# Patient Record
Sex: Male | Born: 1960 | Race: White | Hispanic: No | Marital: Single | Smoking: Current every day smoker
Health system: Southern US, Community
[De-identification: ages and names within clinical notes are randomized; demographics above are authoritative.]

## PROBLEM LIST (undated history)

## (undated) DIAGNOSIS — G8921 Chronic pain due to trauma: Secondary | ICD-10-CM

## (undated) DIAGNOSIS — M5417 Radiculopathy, lumbosacral region: Secondary | ICD-10-CM

## (undated) DIAGNOSIS — G588 Other specified mononeuropathies: Secondary | ICD-10-CM

## (undated) DIAGNOSIS — R209 Unspecified disturbances of skin sensation: Secondary | ICD-10-CM

## (undated) DIAGNOSIS — R0683 Snoring: Secondary | ICD-10-CM

## (undated) DIAGNOSIS — S43429A Sprain of unspecified rotator cuff capsule, initial encounter: Secondary | ICD-10-CM

## (undated) DIAGNOSIS — I219 Acute myocardial infarction, unspecified: Secondary | ICD-10-CM

## (undated) DIAGNOSIS — T7840XA Allergy, unspecified, initial encounter: Secondary | ICD-10-CM

## (undated) HISTORY — DX: Radiculopathy, lumbosacral region: M54.17

## (undated) HISTORY — DX: Other specified mononeuropathies: G58.8

## (undated) HISTORY — PX: MULTIPLE TOOTH EXTRACTIONS: SHX2053

## (undated) HISTORY — DX: Unspecified disturbances of skin sensation: R20.9

## (undated) HISTORY — DX: Acute myocardial infarction, unspecified: I21.9

## (undated) HISTORY — DX: Sprain of unspecified rotator cuff capsule, initial encounter: S43.429A

## (undated) HISTORY — DX: Allergy, unspecified, initial encounter: T78.40XA

## (undated) HISTORY — DX: Chronic pain due to trauma: G89.21

---

## 2009-12-23 ENCOUNTER — Inpatient Hospital Stay (HOSPITAL_COMMUNITY): Admission: AC | Admit: 2009-12-23 | Discharge: 2010-01-02 | Payer: Self-pay

## 2009-12-30 ENCOUNTER — Ambulatory Visit: Payer: Self-pay | Admitting: Physical Medicine & Rehabilitation

## 2010-01-02 ENCOUNTER — Inpatient Hospital Stay (HOSPITAL_COMMUNITY)
Admission: RE | Admit: 2010-01-02 | Discharge: 2010-01-09 | Payer: Self-pay | Admitting: Physical Medicine & Rehabilitation

## 2010-06-16 ENCOUNTER — Encounter
Admission: RE | Admit: 2010-06-16 | Discharge: 2010-08-07 | Payer: Self-pay | Source: Home / Self Care | Attending: Physical Medicine & Rehabilitation | Admitting: Physical Medicine & Rehabilitation

## 2010-06-20 ENCOUNTER — Ambulatory Visit: Payer: Self-pay | Admitting: Physical Medicine & Rehabilitation

## 2010-07-03 ENCOUNTER — Ambulatory Visit: Payer: Self-pay | Admitting: Physical Medicine & Rehabilitation

## 2010-07-13 HISTORY — PX: SHOULDER SURGERY: SHX246

## 2010-08-04 ENCOUNTER — Encounter
Admission: RE | Admit: 2010-08-04 | Discharge: 2010-08-07 | Payer: Self-pay | Source: Home / Self Care | Attending: Physical Medicine & Rehabilitation | Admitting: Physical Medicine & Rehabilitation

## 2010-08-26 ENCOUNTER — Ambulatory Visit: Payer: Worker's Compensation | Admitting: Physical Medicine & Rehabilitation

## 2010-08-26 ENCOUNTER — Encounter: Payer: Worker's Compensation | Attending: Physical Medicine & Rehabilitation

## 2010-08-26 DIAGNOSIS — G8921 Chronic pain due to trauma: Secondary | ICD-10-CM | POA: Insufficient documentation

## 2010-08-26 DIAGNOSIS — M545 Low back pain, unspecified: Secondary | ICD-10-CM

## 2010-08-26 DIAGNOSIS — IMO0002 Reserved for concepts with insufficient information to code with codable children: Secondary | ICD-10-CM | POA: Insufficient documentation

## 2010-08-26 DIAGNOSIS — R209 Unspecified disturbances of skin sensation: Secondary | ICD-10-CM

## 2010-08-26 DIAGNOSIS — M79609 Pain in unspecified limb: Secondary | ICD-10-CM | POA: Insufficient documentation

## 2010-08-26 DIAGNOSIS — M47817 Spondylosis without myelopathy or radiculopathy, lumbosacral region: Secondary | ICD-10-CM

## 2010-08-26 DIAGNOSIS — M549 Dorsalgia, unspecified: Secondary | ICD-10-CM | POA: Insufficient documentation

## 2010-09-28 LAB — DIFFERENTIAL
Basophils Absolute: 0.1 10*3/uL (ref 0.0–0.1)
Basophils Absolute: 0.1 10*3/uL (ref 0.0–0.1)
Eosinophils Absolute: 0.6 10*3/uL (ref 0.0–0.7)
Eosinophils Relative: 6 % — ABNORMAL HIGH (ref 0–5)
Lymphocytes Relative: 24 % (ref 12–46)
Lymphocytes Relative: 27 % (ref 12–46)
Neutro Abs: 5 10*3/uL (ref 1.7–7.7)

## 2010-09-28 LAB — HEMOGLOBIN AND HEMATOCRIT, BLOOD: Hemoglobin: 10 g/dL — ABNORMAL LOW (ref 13.0–17.0)

## 2010-09-28 LAB — CULTURE, RESPIRATORY W GRAM STAIN: Culture: NORMAL

## 2010-09-28 LAB — CBC
HCT: 29.1 % — ABNORMAL LOW (ref 39.0–52.0)
HCT: 29.1 % — ABNORMAL LOW (ref 39.0–52.0)
HCT: 32.1 % — ABNORMAL LOW (ref 39.0–52.0)
HCT: 35.8 % — ABNORMAL LOW (ref 39.0–52.0)
HCT: 37 % — ABNORMAL LOW (ref 39.0–52.0)
Hemoglobin: 10.7 g/dL — ABNORMAL LOW (ref 13.0–17.0)
Hemoglobin: 12 g/dL — ABNORMAL LOW (ref 13.0–17.0)
Hemoglobin: 12.6 g/dL — ABNORMAL LOW (ref 13.0–17.0)
Hemoglobin: 9.9 g/dL — ABNORMAL LOW (ref 13.0–17.0)
MCH: 29.7 pg (ref 26.0–34.0)
MCHC: 34 g/dL (ref 30.0–36.0)
MCV: 88.8 fL (ref 78.0–100.0)
MCV: 89.9 fL (ref 78.0–100.0)
MCV: 89.9 fL (ref 78.0–100.0)
MCV: 90.6 fL (ref 78.0–100.0)
Platelets: 130 10*3/uL — ABNORMAL LOW (ref 150–400)
Platelets: 131 10*3/uL — ABNORMAL LOW (ref 150–400)
Platelets: 314 10*3/uL (ref 150–400)
Platelets: 514 10*3/uL — ABNORMAL HIGH (ref 150–400)
RBC: 3.23 MIL/uL — ABNORMAL LOW (ref 4.22–5.81)
RBC: 3.24 MIL/uL — ABNORMAL LOW (ref 4.22–5.81)
RBC: 3.6 MIL/uL — ABNORMAL LOW (ref 4.22–5.81)
RBC: 3.95 MIL/uL — ABNORMAL LOW (ref 4.22–5.81)
RBC: 4.11 MIL/uL — ABNORMAL LOW (ref 4.22–5.81)
RDW: 13.4 % (ref 11.5–15.5)
RDW: 13.6 % (ref 11.5–15.5)
WBC: 11.5 10*3/uL — ABNORMAL HIGH (ref 4.0–10.5)
WBC: 13.1 10*3/uL — ABNORMAL HIGH (ref 4.0–10.5)
WBC: 8.1 10*3/uL (ref 4.0–10.5)
WBC: 8.6 10*3/uL (ref 4.0–10.5)

## 2010-09-28 LAB — COMPREHENSIVE METABOLIC PANEL
ALT: 31 U/L (ref 0–53)
AST: 20 U/L (ref 0–37)
CO2: 26 mEq/L (ref 19–32)
Chloride: 103 mEq/L (ref 96–112)
Creatinine, Ser: 0.89 mg/dL (ref 0.4–1.5)
GFR calc Af Amer: >60 mL/min (ref >60)
GFR calc non Af Amer: >60 mL/min (ref >60)
Glucose, Bld: 104 mg/dL — ABNORMAL HIGH (ref 70–99)
Total Bilirubin: 0.8 mg/dL (ref 0.3–1.2)

## 2010-09-28 LAB — BASIC METABOLIC PANEL
BUN: 14 mg/dL (ref 6–23)
CO2: 26 mEq/L (ref 19–32)
CO2: 27 mEq/L (ref 19–32)
Calcium: 8 mg/dL — ABNORMAL LOW (ref 8.4–10.5)
Calcium: 8.2 mg/dL — ABNORMAL LOW (ref 8.4–10.5)
Chloride: 103 mEq/L (ref 96–112)
Creatinine, Ser: 0.95 mg/dL (ref 0.4–1.5)
GFR calc Af Amer: >60 mL/min (ref >60)
GFR calc Af Amer: >60 mL/min (ref >60)
GFR calc Af Amer: >60 mL/min (ref >60)
GFR calc non Af Amer: >60 mL/min (ref >60)
GFR calc non Af Amer: >60 mL/min (ref >60)
Glucose, Bld: 111 mg/dL — ABNORMAL HIGH (ref 70–99)
Potassium: 4.1 mEq/L (ref 3.5–5.1)
Potassium: 4.1 mEq/L (ref 3.5–5.1)
Sodium: 136 mEq/L (ref 135–145)

## 2010-09-28 LAB — EXPECTORATED SPUTUM ASSESSMENT W GRAM STAIN, RFLX TO RESP C

## 2010-09-29 LAB — COMPREHENSIVE METABOLIC PANEL
AST: 48 U/L — ABNORMAL HIGH (ref 0–37)
Albumin: 3.6 g/dL (ref 3.5–5.2)
Alkaline Phosphatase: 61 U/L (ref 39–117)
BUN: 10 mg/dL (ref 6–23)
CO2: 22 mEq/L (ref 19–32)
Chloride: 109 mEq/L (ref 96–112)
GFR calc Af Amer: >60 mL/min (ref >60)
GFR calc non Af Amer: 55 mL/min — ABNORMAL LOW (ref >60)
Potassium: 3.6 mEq/L (ref 3.5–5.1)
Total Bilirubin: 0.6 mg/dL (ref 0.3–1.2)

## 2010-09-29 LAB — CBC
HCT: 40.8 % (ref 39.0–52.0)
HCT: 47.3 % (ref 39.0–52.0)
Hemoglobin: 14.1 g/dL (ref 13.0–17.0)
MCHC: 33.3 g/dL (ref 30.0–36.0)
MCV: 90 fL (ref 78.0–100.0)
Platelets: 218 10*3/uL (ref 150–400)
RBC: 5.25 MIL/uL (ref 4.22–5.81)
RDW: 13.7 % (ref 11.5–15.5)
WBC: 27.1 10*3/uL — ABNORMAL HIGH (ref 4.0–10.5)

## 2010-09-29 LAB — POCT I-STAT, CHEM 8
Calcium, Ion: 1.08 mmol/L — ABNORMAL LOW (ref 1.12–1.32)
Chloride: 110 meq/L (ref 96–112)
Creatinine, Ser: 1.2 mg/dL (ref 0.4–1.5)
Glucose, Bld: 162 mg/dL — ABNORMAL HIGH (ref 70–99)
Potassium: 3.8 meq/L (ref 3.5–5.1)

## 2010-09-29 LAB — TYPE AND SCREEN: ABO/RH(D): A POS

## 2010-09-29 LAB — ABO/RH: ABO/RH(D): A POS

## 2010-10-02 ENCOUNTER — Encounter: Payer: Worker's Compensation | Attending: Physical Medicine & Rehabilitation

## 2010-10-02 ENCOUNTER — Encounter: Payer: Worker's Compensation | Admitting: Physical Medicine & Rehabilitation

## 2010-10-02 DIAGNOSIS — R209 Unspecified disturbances of skin sensation: Secondary | ICD-10-CM | POA: Insufficient documentation

## 2010-10-02 DIAGNOSIS — M549 Dorsalgia, unspecified: Secondary | ICD-10-CM | POA: Insufficient documentation

## 2010-10-02 DIAGNOSIS — IMO0002 Reserved for concepts with insufficient information to code with codable children: Secondary | ICD-10-CM | POA: Insufficient documentation

## 2010-10-02 DIAGNOSIS — G8921 Chronic pain due to trauma: Secondary | ICD-10-CM | POA: Insufficient documentation

## 2010-10-02 DIAGNOSIS — M79609 Pain in unspecified limb: Secondary | ICD-10-CM | POA: Insufficient documentation

## 2010-10-14 ENCOUNTER — Encounter: Payer: Worker's Compensation | Attending: Physical Medicine & Rehabilitation

## 2010-10-14 ENCOUNTER — Ambulatory Visit: Payer: Worker's Compensation | Admitting: Physical Medicine & Rehabilitation

## 2010-10-14 DIAGNOSIS — M549 Dorsalgia, unspecified: Secondary | ICD-10-CM | POA: Insufficient documentation

## 2010-10-14 DIAGNOSIS — M129 Arthropathy, unspecified: Secondary | ICD-10-CM | POA: Insufficient documentation

## 2010-10-14 DIAGNOSIS — M62838 Other muscle spasm: Secondary | ICD-10-CM | POA: Insufficient documentation

## 2010-10-14 DIAGNOSIS — G5620 Lesion of ulnar nerve, unspecified upper limb: Secondary | ICD-10-CM

## 2010-10-14 DIAGNOSIS — Z79899 Other long term (current) drug therapy: Secondary | ICD-10-CM | POA: Insufficient documentation

## 2010-10-14 DIAGNOSIS — R209 Unspecified disturbances of skin sensation: Secondary | ICD-10-CM | POA: Insufficient documentation

## 2010-10-14 DIAGNOSIS — R42 Dizziness and giddiness: Secondary | ICD-10-CM | POA: Insufficient documentation

## 2010-10-14 DIAGNOSIS — IMO0002 Reserved for concepts with insufficient information to code with codable children: Secondary | ICD-10-CM

## 2010-11-24 ENCOUNTER — Encounter: Payer: Worker's Compensation | Admitting: Physical Medicine & Rehabilitation

## 2010-11-24 ENCOUNTER — Encounter: Payer: Worker's Compensation | Attending: Physical Medicine & Rehabilitation

## 2010-11-24 DIAGNOSIS — M47817 Spondylosis without myelopathy or radiculopathy, lumbosacral region: Secondary | ICD-10-CM

## 2010-11-24 DIAGNOSIS — R209 Unspecified disturbances of skin sensation: Secondary | ICD-10-CM | POA: Insufficient documentation

## 2010-11-24 DIAGNOSIS — M549 Dorsalgia, unspecified: Secondary | ICD-10-CM | POA: Insufficient documentation

## 2010-11-24 DIAGNOSIS — M79609 Pain in unspecified limb: Secondary | ICD-10-CM | POA: Insufficient documentation

## 2010-11-24 DIAGNOSIS — IMO0002 Reserved for concepts with insufficient information to code with codable children: Secondary | ICD-10-CM | POA: Insufficient documentation

## 2010-11-24 DIAGNOSIS — G8921 Chronic pain due to trauma: Secondary | ICD-10-CM | POA: Insufficient documentation

## 2010-11-25 NOTE — Procedures (Signed)
NAME:  Matthew Sloan, CELMER NO.:  [id number]  MEDICAL RECORD NO.:  [id number]           PATIENT TYPE:  O  LOCATION:  [facility]                          FACILITY:  [facility]  PHYSICIAN:  [name], M.D.DATE OF BIRTH:  08-27-1960  DATE OF PROCEDURE:  11/24/2010 DATE OF DISCHARGE:                              OPERATIVE REPORT  PROCEDURE:  Bilateral L5 dorsal ramus injection, bilateral L4 medial branch blocks, bilateral L3 medial branch blocks under fluoroscopic guidance.  INDICATIONS:  Severe pain rated as 8/10 with lumbar spondylosis.  His pain is only partially responsive to medication management and other conservative care interfering with activities.  Informed consent was obtained after describing risks and benefits of the procedure with the patient.  These include bleeding, bruising and infection.  He elects to proceed and has given written consent.  The patient placed prone on fluoroscopy table.  Betadine prep, sterile drape, 25-gauge inch and half needle was used to anesthetize the skin and subcu tissue 1% lidocaine x1.5 mL at each of 6 sites.  Then, a 22- gauge, 3-1/2-inch spinal needle was inserted under fluoroscopic guidance, first starting at the left S1 SAP sacroiliac junction, bone contact made, confirmed with lateral imaging.  Omnipaque 180 x 0.5 mL demonstrated no intravascular uptake followed by injection of 1.5 mL of the solution containing 1 mL of 4 mg/mL dexamethasone and 2 mL of 2% MPF lidocaine.  Then, the left L5 SAP transverse process junction targeted, bone contact made.  Omnipaque 180 x 0.5 mL demonstrated no intravascular uptake.  Then, 0.5 mL of the dexamethasone lidocaine solution was injected.  Then, the left L4 SAP transverse process junction targeted, bone contact made.  Omnipaque 180 x 0.5 mL demonstrated no intravascular uptake and 0.5 mL of the dexamethasone lidocaine solution was injected. The same procedure was repeated on the right  side using same needle injectate and technique.  The patient tolerated the procedure well.  Pre and post injection vitals stable.  Post injection instructions given.     [name], M.D. Electronically Signed    AEK/MEDQ  D:  11/24/2010 15:13:34  T:  11/25/2010 01:10:01  Job:  [id number]

## 2010-12-12 ENCOUNTER — Ambulatory Visit: Payer: Worker's Compensation | Admitting: Physical Medicine & Rehabilitation

## 2010-12-12 ENCOUNTER — Encounter: Payer: Worker's Compensation | Attending: Physical Medicine & Rehabilitation

## 2010-12-12 DIAGNOSIS — R269 Unspecified abnormalities of gait and mobility: Secondary | ICD-10-CM | POA: Insufficient documentation

## 2010-12-12 DIAGNOSIS — G8921 Chronic pain due to trauma: Secondary | ICD-10-CM

## 2010-12-12 DIAGNOSIS — IMO0002 Reserved for concepts with insufficient information to code with codable children: Secondary | ICD-10-CM

## 2010-12-12 DIAGNOSIS — M62838 Other muscle spasm: Secondary | ICD-10-CM | POA: Insufficient documentation

## 2010-12-12 DIAGNOSIS — M545 Low back pain, unspecified: Secondary | ICD-10-CM | POA: Insufficient documentation

## 2010-12-12 DIAGNOSIS — R42 Dizziness and giddiness: Secondary | ICD-10-CM | POA: Insufficient documentation

## 2010-12-12 DIAGNOSIS — R209 Unspecified disturbances of skin sensation: Secondary | ICD-10-CM

## 2010-12-12 NOTE — Assessment & Plan Note (Signed)
1. I spoke with the patient along with case manager, Esther Hardy.     We discussed further injections including SI injection to further     evaluate for sacroiliac dysfunction related to pelvic trauma.  We     discussed trial of Nucynta ER 50 b.i.d. for a week and then 100     b.i.d. for a week, if not helpful follow this up when I see him     back for the injection. 2. I discussed the left ulnar neuropathy type symptoms.  I discussed     the EMG in the future, but we will first focus on the back.     Questions answered.  Out work until I see him next.     [name], M.D. Electronically Signed    AEK/MedQ D:  12/12/2010 11:06:23  T:  12/12/2010 12:48:32  Job #:  [id number]  cc:   Esther Hardy fax [phone]
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
## 2011-01-01 ENCOUNTER — Encounter: Payer: Worker's Compensation | Attending: Physical Medicine & Rehabilitation

## 2011-01-01 ENCOUNTER — Ambulatory Visit: Payer: Worker's Compensation | Admitting: Physical Medicine & Rehabilitation

## 2011-01-01 DIAGNOSIS — R209 Unspecified disturbances of skin sensation: Secondary | ICD-10-CM | POA: Insufficient documentation

## 2011-01-01 DIAGNOSIS — IMO0002 Reserved for concepts with insufficient information to code with codable children: Secondary | ICD-10-CM | POA: Insufficient documentation

## 2011-01-01 DIAGNOSIS — G8921 Chronic pain due to trauma: Secondary | ICD-10-CM | POA: Insufficient documentation

## 2011-01-01 DIAGNOSIS — M79609 Pain in unspecified limb: Secondary | ICD-10-CM | POA: Insufficient documentation

## 2011-01-01 DIAGNOSIS — M549 Dorsalgia, unspecified: Secondary | ICD-10-CM | POA: Insufficient documentation

## 2011-01-19 ENCOUNTER — Encounter: Payer: Worker's Compensation | Attending: Physical Medicine & Rehabilitation

## 2011-01-19 ENCOUNTER — Ambulatory Visit: Payer: Worker's Compensation | Admitting: Physical Medicine & Rehabilitation

## 2011-01-19 DIAGNOSIS — G548 Other nerve root and plexus disorders: Secondary | ICD-10-CM

## 2011-01-19 DIAGNOSIS — R079 Chest pain, unspecified: Secondary | ICD-10-CM | POA: Insufficient documentation

## 2011-01-19 DIAGNOSIS — H9319 Tinnitus, unspecified ear: Secondary | ICD-10-CM | POA: Insufficient documentation

## 2011-01-19 DIAGNOSIS — M549 Dorsalgia, unspecified: Secondary | ICD-10-CM | POA: Insufficient documentation

## 2011-01-19 DIAGNOSIS — M25559 Pain in unspecified hip: Secondary | ICD-10-CM | POA: Insufficient documentation

## 2011-01-19 DIAGNOSIS — IMO0001 Reserved for inherently not codable concepts without codable children: Secondary | ICD-10-CM | POA: Insufficient documentation

## 2011-01-19 DIAGNOSIS — M533 Sacrococcygeal disorders, not elsewhere classified: Secondary | ICD-10-CM

## 2011-01-19 NOTE — Procedures (Signed)
NAME:  Matthew Sloan, NORTHUP NO.:  [id number]  MEDICAL RECORD NO.:  [id number]           PATIENT TYPE:  O  LOCATION:  [facility]                          FACILITY:  [facility]  PHYSICIAN:  [name], M.D.DATE OF BIRTH:  [birth year]  DATE OF PROCEDURE:  01/19/2011 DATE OF DISCHARGE:                              OPERATIVE REPORT  PROCEDURE:  Right sacroiliac injection under fluoroscopic guidance.  INDICATION:  Right buttock and hip pain, history of work-related injury resulting in a compression of the pelvic area, dislocation of the hip. Pain is only partially response to medication management, other conservative care, interferes with activity.  Informed consent was obtained after describing risks and benefits of the procedure with the patient.  These include bleeding, bruising, and infection, he elects to proceed and has given written consent.  The patient placed prone on fluoroscopy table with Betadine prep, sterile drape.  A 25-gauge inch and half needle was used to anesthetize the skin and subcutaneous tissue with 1% lidocaine x2 mL.  Then 25-gauge 3-inch spinal needle was inserted under fluoroscopic guidance in the right SI joint, AP lateral and oblique images utilized.  Omnipaque 180 under live fluoro demonstrated no intravascular uptake, followed by injection of 1 mL of 2% MPF lidocaine plus 0.5 mL of 40 mcg/mL Depo- Medrol.  The patient tolerated procedure well.  Attempted to inject the left side, however could not gain access.  No postprocedure complications.  Return in 1 month for recheck.     [name], M.D. Electronically Signed    AEK/MEDQ  D:  01/19/2011 13:50:08  T:  01/19/2011 23:44:20  Job:  [id number]
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
## 2011-02-24 ENCOUNTER — Ambulatory Visit: Payer: Worker's Compensation | Admitting: Physical Medicine & Rehabilitation

## 2011-02-24 ENCOUNTER — Encounter: Payer: Worker's Compensation | Attending: Physical Medicine & Rehabilitation

## 2011-02-24 DIAGNOSIS — M549 Dorsalgia, unspecified: Secondary | ICD-10-CM | POA: Insufficient documentation

## 2011-02-24 DIAGNOSIS — R209 Unspecified disturbances of skin sensation: Secondary | ICD-10-CM | POA: Insufficient documentation

## 2011-02-24 DIAGNOSIS — G5730 Lesion of lateral popliteal nerve, unspecified lower limb: Secondary | ICD-10-CM

## 2011-02-24 DIAGNOSIS — IMO0002 Reserved for concepts with insufficient information to code with codable children: Secondary | ICD-10-CM | POA: Insufficient documentation

## 2011-02-24 DIAGNOSIS — G8921 Chronic pain due to trauma: Secondary | ICD-10-CM

## 2011-02-24 DIAGNOSIS — M79609 Pain in unspecified limb: Secondary | ICD-10-CM | POA: Insufficient documentation

## 2011-02-24 DIAGNOSIS — M25559 Pain in unspecified hip: Secondary | ICD-10-CM

## 2011-02-24 NOTE — Assessment & Plan Note (Signed)
Discussed findings with Shirley [place], medical case manager, recommendations to increase hydrocodone 10/325 q.i.d., start trazodone 50 nightly, referral for chronic pain program interdisciplinary, referral to comp rehab in [place] attention Dr. Ula Lingo to evaluate.     [name], M.D. Electronically Signed    AEK/MedQ D:  02/24/2011 11:33:27  T:  02/24/2011 20:29:16  Job #:  [id number]
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
## 2011-03-30 ENCOUNTER — Ambulatory Visit: Payer: Worker's Compensation | Admitting: Physical Medicine & Rehabilitation

## 2011-07-28 ENCOUNTER — Encounter: Payer: Worker's Compensation | Attending: Physical Medicine & Rehabilitation

## 2011-07-28 ENCOUNTER — Ambulatory Visit: Payer: Worker's Compensation | Admitting: Physical Medicine & Rehabilitation

## 2011-07-28 DIAGNOSIS — G8921 Chronic pain due to trauma: Secondary | ICD-10-CM | POA: Insufficient documentation

## 2011-07-28 DIAGNOSIS — G548 Other nerve root and plexus disorders: Secondary | ICD-10-CM

## 2011-07-28 DIAGNOSIS — G479 Sleep disorder, unspecified: Secondary | ICD-10-CM | POA: Insufficient documentation
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
## 2011-08-24 ENCOUNTER — Ambulatory Visit: Payer: Worker's Compensation | Admitting: Physical Medicine & Rehabilitation

## 2011-08-25 ENCOUNTER — Ambulatory Visit: Payer: Self-pay | Admitting: Physical Medicine & Rehabilitation

## 2011-09-03 ENCOUNTER — Telehealth: Payer: Self-pay | Admitting: Physical Medicine & Rehabilitation

## 2011-09-03 MED ORDER — HYDROCODONE-ACETAMINOPHEN 7.5-325 MG PO TABS: 1.0000 | ORAL_TABLET | Freq: Three times a day (TID) | ORAL | Status: DC | PRN

## 2011-09-03 MED ORDER — HYDROCODONE-ACETAMINOPHEN 10-325 MG PO TABS: 1.0000 | ORAL_TABLET | Freq: Four times a day (QID) | ORAL | Status: DC | PRN

## 2011-09-03 NOTE — Telephone Encounter (Signed)
Pt needs rf on hydrocodone

## 2011-09-03 NOTE — Telephone Encounter (Signed)
Medication refill called to pharmacy on file. Patient notified.

## 2011-09-04 ENCOUNTER — Ambulatory Visit: Payer: Worker's Compensation | Admitting: Physical Medicine & Rehabilitation

## 2011-09-07 ENCOUNTER — Ambulatory Visit: Payer: Worker's Compensation | Admitting: Physical Medicine & Rehabilitation

## 2011-09-11 ENCOUNTER — Telehealth: Payer: Self-pay | Admitting: Physical Medicine & Rehabilitation

## 2011-09-11 ENCOUNTER — Encounter: Payer: Self-pay | Admitting: Physical Medicine & Rehabilitation

## 2011-09-11 ENCOUNTER — Ambulatory Visit (HOSPITAL_BASED_OUTPATIENT_CLINIC_OR_DEPARTMENT_OTHER): Payer: Worker's Compensation | Admitting: Physical Medicine & Rehabilitation

## 2011-09-11 ENCOUNTER — Encounter: Payer: Worker's Compensation | Attending: Physical Medicine & Rehabilitation

## 2011-09-11 DIAGNOSIS — M25559 Pain in unspecified hip: Secondary | ICD-10-CM

## 2011-09-11 DIAGNOSIS — G548 Other nerve root and plexus disorders: Secondary | ICD-10-CM | POA: Insufficient documentation

## 2011-09-11 DIAGNOSIS — G8921 Chronic pain due to trauma: Secondary | ICD-10-CM | POA: Insufficient documentation

## 2011-09-11 DIAGNOSIS — IMO0002 Reserved for concepts with insufficient information to code with codable children: Secondary | ICD-10-CM

## 2011-09-11 DIAGNOSIS — M5416 Radiculopathy, lumbar region: Secondary | ICD-10-CM | POA: Insufficient documentation

## 2011-09-11 DIAGNOSIS — M24559 Contracture, unspecified hip: Secondary | ICD-10-CM

## 2011-09-11 DIAGNOSIS — G479 Sleep disorder, unspecified: Secondary | ICD-10-CM | POA: Insufficient documentation

## 2011-09-11 MED ORDER — GABAPENTIN 800 MG PO TABS: 600.0000 mg | ORAL_TABLET | Freq: Three times a day (TID) | ORAL | Status: DC

## 2011-09-11 MED ORDER — HYDROCODONE-ACETAMINOPHEN 10-325 MG PO TABS: 1.0000 | ORAL_TABLET | Freq: Three times a day (TID) | ORAL | Status: AC | PRN

## 2011-09-11 NOTE — Telephone Encounter (Signed)
Tammy from pharmacy called to advise that patient filled Norco 7.5 on 09/03/11.  Today received rx for Norco 10/325.Did Dr make patient aware that he is to stop the previous rx?

## 2011-09-11 NOTE — Telephone Encounter (Signed)
Informed her that we are aware, and she said that she had talked with the patient as well and knows our plan.
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
## 2011-09-11 NOTE — Patient Instructions (Signed)
Chronic Back Pain When back pain lasts longer than 3 months, it is called chronic back pain.This pain can be frustrating, but the cause of the pain is rarely dangerous.People with chronic back pain often go through certain periods that are more intense (flare-ups). CAUSES Chronic back pain can be caused by wear and tear (degeneration) on different structures in your back. These structures may include bones, ligaments, or discs. This degeneration may result in more pressure being placed on the nerves that travel to your legs and feet. This can lead to pain traveling from the low back down the back of the legs. When pain lasts longer than 3 months, it is not unusual for people to experience anxiety or depression. Anxiety and depression can also contribute to low back pain. TREATMENT  Establish a regular exercise plan. This is critical to improving your functional level.   Have a self-management plan for when you flare-up. Flare-ups rarely require a medical visit. Regular exercise will help reduce the intensity and frequency of your flare-ups.   Manage how you feel about your back pain and the rest of your life. Anxiety, depression, and feeling that you cannot alter your back pain have been shown to make back pain more intense and debilitating.   Medicines should never be your only treatment. They should be used along with other treatments to help you return to a more active lifestyle.   Procedures such as injections or surgery may be helpful but are rarely necessary. You may be able to get the same results with physical therapy or chiropractic care.  HOME CARE INSTRUCTIONS  Avoid bending, heavy lifting, prolonged sitting, and activities which make the problem worse.   Continue normal activity as much as possible.   Take brief periods of rest throughout the day to reduce your pain during flare-ups.   Follow your back exercise rehabilitation program. This can help reduce symptoms and prevent  more pain.   Only take over-the-counter or prescription medicines as directed by your caregiver. Muscle relaxants are sometimes prescribed. Narcotic pain medicine is discouraged for long-term pain, since addiction is a possible outcome.   If you smoke, quit.   Eat healthy foods and maintain a recommended body weight.  SEEK IMMEDIATE MEDICAL CARE IF:   You have weakness or numbness in one of your legs or feet.   You have trouble controlling your bladder or bowels.   You develop nausea, vomiting, abdominal pain, shortness of breath, or fainting.  Document Released: 08/06/2004 Document Revised: 03/11/2011 Document Reviewed: 11/17/2010 ExitCare Patient Information 2012 [employer]. 

## 2011-10-12 ENCOUNTER — Encounter: Payer: Worker's Compensation | Attending: Physical Medicine & Rehabilitation

## 2011-10-12 ENCOUNTER — Ambulatory Visit (HOSPITAL_BASED_OUTPATIENT_CLINIC_OR_DEPARTMENT_OTHER): Payer: Worker's Compensation | Admitting: Physical Medicine & Rehabilitation

## 2011-10-12 ENCOUNTER — Encounter: Payer: Self-pay | Admitting: Physical Medicine & Rehabilitation

## 2011-10-12 VITALS — BP 141/82 | HR 89 | Resp 16 | Ht 69.0 in | Wt 235.0 lb

## 2011-10-12 DIAGNOSIS — G8921 Chronic pain due to trauma: Secondary | ICD-10-CM

## 2011-10-12 DIAGNOSIS — G548 Other nerve root and plexus disorders: Secondary | ICD-10-CM | POA: Insufficient documentation

## 2011-10-12 DIAGNOSIS — M519 Unspecified thoracic, thoracolumbar and lumbosacral intervertebral disc disorder: Secondary | ICD-10-CM

## 2011-10-12 DIAGNOSIS — G479 Sleep disorder, unspecified: Secondary | ICD-10-CM | POA: Insufficient documentation

## 2011-10-12 DIAGNOSIS — IMO0002 Reserved for concepts with insufficient information to code with codable children: Secondary | ICD-10-CM

## 2011-10-12 NOTE — Patient Instructions (Signed)
Chronic Back Pain When back pain lasts longer than 3 months, it is called chronic back pain.This pain can be frustrating, but the cause of the pain is rarely dangerous.People with chronic back pain often go through certain periods that are more intense (flare-ups). CAUSES Chronic back pain can be caused by wear and tear (degeneration) on different structures in your back. These structures may include bones, ligaments, or discs. This degeneration may result in more pressure being placed on the nerves that travel to your legs and feet. This can lead to pain traveling from the low back down the back of the legs. When pain lasts longer than 3 months, it is not unusual for people to experience anxiety or depression. Anxiety and depression can also contribute to low back pain. TREATMENT  Establish a regular exercise plan. This is critical to improving your functional level.   Have a self-management plan for when you flare-up. Flare-ups rarely require a medical visit. Regular exercise will help reduce the intensity and frequency of your flare-ups.   Manage how you feel about your back pain and the rest of your life. Anxiety, depression, and feeling that you cannot alter your back pain have been shown to make back pain more intense and debilitating.   Medicines should never be your only treatment. They should be used along with other treatments to help you return to a more active lifestyle.   Procedures such as injections or surgery may be helpful but are rarely necessary. You may be able to get the same results with physical therapy or chiropractic care.  HOME CARE INSTRUCTIONS  Avoid bending, heavy lifting, prolonged sitting, and activities which make the problem worse.   Continue normal activity as much as possible.   Take brief periods of rest throughout the day to reduce your pain during flare-ups.   Follow your back exercise rehabilitation program. This can help reduce symptoms and prevent  more pain.   Only take over-the-counter or prescription medicines as directed by your caregiver. Muscle relaxants are sometimes prescribed. Narcotic pain medicine is discouraged for long-term pain, since addiction is a possible outcome.   If you smoke, quit.   Eat healthy foods and maintain a recommended body weight.  SEEK IMMEDIATE MEDICAL CARE IF:   You have weakness or numbness in one of your legs or feet.   You have trouble controlling your bladder or bowels.   You develop nausea, vomiting, abdominal pain, shortness of breath, or fainting.  Document Released: 08/06/2004 Document Revised: 06/18/2011 Document Reviewed: 06/13/2011 ExitCare Patient Information 2012 [employer]. 
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
## 2011-10-13 ENCOUNTER — Encounter: Payer: Self-pay | Admitting: *Deleted

## 2011-10-13 ENCOUNTER — Other Ambulatory Visit: Payer: Self-pay | Admitting: *Deleted

## 2011-10-13 MED ORDER — HYDROCODONE-ACETAMINOPHEN 10-325 MG PO TABS: 1.0000 | ORAL_TABLET | Freq: Four times a day (QID) | ORAL | Status: DC | PRN

## 2011-10-13 NOTE — Progress Notes (Signed)
New Pharmacy : [facility].

## 2011-10-13 NOTE — Telephone Encounter (Signed)
Notified Mr Nelis that his medication has been called in.

## 2011-10-14 ENCOUNTER — Encounter: Payer: Self-pay | Admitting: Physical Medicine & Rehabilitation

## 2011-10-15 ENCOUNTER — Telehealth: Payer: Self-pay | Admitting: *Deleted

## 2011-10-15 NOTE — Telephone Encounter (Signed)
Matthew Sloan had an appointment with Dr Wynn Banker on Monday and was supposed to review his Physical Capabilities and Work Readiness form from his evaluation at [facility], and the form was not available.  Matthew Sloan has asked them to re-fax the form and report.  The form will be under the cover letter and then the report. Ask Dr Wynn Banker to please review and call Matthew Sloan and tell him what he is in agreement and disagreement with in the report, then send a copy of the release "or whatever" stating whether he agrees or disagrees, on the work readiness form and fax to [place] @ [phone].

## 2011-10-19 NOTE — Telephone Encounter (Signed)
I agree with the FCE report. The patient is cleared for sedentary duty 8 hours per day. I typically do not call and discussed these results with patient. Please fax the report to [facility] at the number provided.

## 2011-10-20 NOTE — Telephone Encounter (Signed)
Response to [facility]CE faxed to Heidi Dach RN CM

## 2011-11-18 ENCOUNTER — Telehealth: Payer: Self-pay | Admitting: Physical Medicine & Rehabilitation

## 2011-11-18 MED ORDER — HYDROCODONE-ACETAMINOPHEN 10-325 MG PO TABS: 1.0000 | ORAL_TABLET | Freq: Four times a day (QID) | ORAL | Status: DC | PRN

## 2011-11-18 NOTE — Telephone Encounter (Signed)
Patient requesting refill on Norco

## 2011-11-18 NOTE — Telephone Encounter (Signed)
Rx has been called in, pt aware. 

## 2011-11-27 ENCOUNTER — Telehealth: Payer: Self-pay | Admitting: *Deleted

## 2011-11-27 MED ORDER — GABAPENTIN 800 MG PO TABS: 600.0000 mg | ORAL_TABLET | Freq: Three times a day (TID) | ORAL | Status: DC

## 2011-11-27 MED ORDER — TRAZODONE HCL 50 MG PO TABS: 50.0000 mg | ORAL_TABLET | Freq: Every day | ORAL | Status: DC

## 2011-11-27 NOTE — Telephone Encounter (Signed)
Needs refill on Gabapentin and Trazodone.  Rx has been sent in, pt aware.

## 2011-12-08 ENCOUNTER — Other Ambulatory Visit: Payer: Self-pay | Admitting: Orthopedic Surgery

## 2011-12-14 ENCOUNTER — Encounter (HOSPITAL_BASED_OUTPATIENT_CLINIC_OR_DEPARTMENT_OTHER): Payer: Self-pay | Admitting: *Deleted
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
## 2011-12-15 ENCOUNTER — Encounter (HOSPITAL_BASED_OUTPATIENT_CLINIC_OR_DEPARTMENT_OTHER): Payer: Self-pay | Admitting: Anesthesiology

## 2011-12-15 ENCOUNTER — Ambulatory Visit (HOSPITAL_BASED_OUTPATIENT_CLINIC_OR_DEPARTMENT_OTHER): Payer: Worker's Compensation | Admitting: Anesthesiology

## 2011-12-15 ENCOUNTER — Encounter (HOSPITAL_BASED_OUTPATIENT_CLINIC_OR_DEPARTMENT_OTHER)
Admission: RE | Disposition: A | Discharge status: Home / Self Care | Payer: Self-pay | Source: Ambulatory Visit | Attending: Orthopedic Surgery

## 2011-12-15 ENCOUNTER — Ambulatory Visit (HOSPITAL_BASED_OUTPATIENT_CLINIC_OR_DEPARTMENT_OTHER)
Admission: RE | Admit: 2011-12-15 | Discharge: 2011-12-15 | Disposition: A | Discharge status: Home / Self Care | Payer: Worker's Compensation | Source: Ambulatory Visit | Attending: Orthopedic Surgery | Admitting: Orthopedic Surgery

## 2011-12-15 ENCOUNTER — Encounter (HOSPITAL_BASED_OUTPATIENT_CLINIC_OR_DEPARTMENT_OTHER): Payer: Self-pay | Admitting: *Deleted

## 2011-12-15 ENCOUNTER — Encounter (HOSPITAL_BASED_OUTPATIENT_CLINIC_OR_DEPARTMENT_OTHER): Payer: Self-pay | Admitting: Orthopedic Surgery

## 2011-12-15 DIAGNOSIS — M549 Dorsalgia, unspecified: Secondary | ICD-10-CM | POA: Insufficient documentation

## 2011-12-15 DIAGNOSIS — F17200 Nicotine dependence, unspecified, uncomplicated: Secondary | ICD-10-CM | POA: Insufficient documentation

## 2011-12-15 DIAGNOSIS — G5620 Lesion of ulnar nerve, unspecified upper limb: Secondary | ICD-10-CM | POA: Insufficient documentation

## 2011-12-15 DIAGNOSIS — G5600 Carpal tunnel syndrome, unspecified upper limb: Secondary | ICD-10-CM | POA: Insufficient documentation

## 2011-12-15 HISTORY — PX: ULNAR TUNNEL RELEASE: SHX820

## 2011-12-15 HISTORY — DX: Snoring: R06.83

## 2011-12-15 HISTORY — PX: CARPAL TUNNEL RELEASE: SHX101

## 2011-12-15 LAB — POCT HEMOGLOBIN-HEMACUE: Hemoglobin: 15.7 g/dL (ref 13.0–17.0)

## 2011-12-15 SURGERY — RELEASE, CUBITAL TUNNEL
Anesthesia: General | Site: Hand | Laterality: Left | Wound class: Clean

## 2011-12-15 MED ORDER — OXYCODONE-ACETAMINOPHEN 5-325 MG PO TABS
1.0000 | ORAL_TABLET | Freq: Once | ORAL | Status: AC | PRN
  Administered 2011-12-15: 2 via ORAL

## 2011-12-15 MED ORDER — HYDROMORPHONE HCL PF 1 MG/ML IJ SOLN
0.2500 mg | INTRAMUSCULAR | Status: DC | PRN
  Administered 2011-12-15 (×2): 0.5 mg via INTRAVENOUS

## 2011-12-15 MED ORDER — DEXAMETHASONE SODIUM PHOSPHATE 4 MG/ML IJ SOLN
INTRAMUSCULAR | Status: DC | PRN
  Administered 2011-12-15: 10 mg via INTRAVENOUS

## 2011-12-15 MED ORDER — BUPIVACAINE HCL (PF) 0.25 % IJ SOLN
INTRAMUSCULAR | Status: DC | PRN
  Administered 2011-12-15: 16 mL

## 2011-12-15 MED ORDER — ONDANSETRON HCL 4 MG/2ML IJ SOLN
INTRAMUSCULAR | Status: DC | PRN
  Administered 2011-12-15: 4 mg via INTRAVENOUS

## 2011-12-15 MED ORDER — MEPERIDINE HCL 25 MG/ML IJ SOLN: 6.2500 mg | INTRAMUSCULAR | Status: DC | PRN

## 2011-12-15 MED ORDER — MIDAZOLAM HCL 5 MG/5ML IJ SOLN
INTRAMUSCULAR | Status: DC | PRN
  Administered 2011-12-15: 2 mg via INTRAVENOUS

## 2011-12-15 MED ORDER — PROPOFOL 10 MG/ML IV EMUL
INTRAVENOUS | Status: DC | PRN
  Administered 2011-12-15: 240 mg via INTRAVENOUS

## 2011-12-15 MED ORDER — FENTANYL CITRATE 0.05 MG/ML IJ SOLN
INTRAMUSCULAR | Status: DC | PRN
  Administered 2011-12-15 (×2): 25 ug via INTRAVENOUS
  Administered 2011-12-15: 100 ug via INTRAVENOUS

## 2011-12-15 MED ORDER — LIDOCAINE HCL (CARDIAC) 20 MG/ML IV SOLN
INTRAVENOUS | Status: DC | PRN
  Administered 2011-12-15: 20 mg via INTRAVENOUS

## 2011-12-15 MED ORDER — VANCOMYCIN HCL IN DEXTROSE 1-5 GM/200ML-% IV SOLN
1000.0000 mg | INTRAVENOUS | Status: AC
  Administered 2011-12-15: 1000 mg via INTRAVENOUS

## 2011-12-15 MED ORDER — LACTATED RINGERS IV SOLN
INTRAVENOUS | Status: DC
  Administered 2011-12-15 (×2): via INTRAVENOUS

## 2011-12-15 MED ORDER — PROMETHAZINE HCL 25 MG/ML IJ SOLN: 6.2500 mg | INTRAMUSCULAR | Status: DC | PRN

## 2011-12-15 MED ORDER — ALBUTEROL SULFATE HFA 108 (90 BASE) MCG/ACT IN AERS
2.0000 | INHALATION_SPRAY | RESPIRATORY_TRACT | Status: DC
  Administered 2011-12-15: 2 via RESPIRATORY_TRACT

## 2011-12-15 MED ORDER — CHLORHEXIDINE GLUCONATE 4 % EX LIQD: 60.0000 mL | Freq: Once | CUTANEOUS | Status: DC

## 2011-12-15 MED ORDER — MIDAZOLAM HCL 2 MG/2ML IJ SOLN: 0.5000 mg | Freq: Once | INTRAMUSCULAR | Status: DC | PRN

## 2011-12-15 MED ORDER — 0.9 % SODIUM CHLORIDE (POUR BTL) OPTIME
TOPICAL | Status: DC | PRN
  Administered 2011-12-15: 1000 mL

## 2011-12-15 MED ORDER — OXYCODONE-ACETAMINOPHEN 5-325 MG PO TABS: ORAL_TABLET | ORAL | Status: AC

## 2011-12-15 SURGICAL SUPPLY — 40 items
BANDAGE ELASTIC 3 VELCRO ST LF (GAUZE/BANDAGES/DRESSINGS) ×3 IMPLANT
BANDAGE GAUZE ELAST BULKY 4 IN (GAUZE/BANDAGES/DRESSINGS) ×3 IMPLANT
BLADE MINI RND TIP GREEN BEAV (BLADE) IMPLANT
BLADE SURG 15 STRL LF DISP TIS (BLADE) ×4 IMPLANT
BLADE SURG 15 STRL SS (BLADE) ×2
BNDG ESMARK 4X9 LF (GAUZE/BANDAGES/DRESSINGS) ×3 IMPLANT
CHLORAPREP W/TINT 26ML (MISCELLANEOUS) ×3 IMPLANT
CLOTH BEACON ORANGE TIMEOUT ST (SAFETY) ×3 IMPLANT
CORDS BIPOLAR (ELECTRODE) ×3 IMPLANT
COVER MAYO STAND STRL (DRAPES) ×3 IMPLANT
COVER TABLE BACK 60X90 (DRAPES) ×3 IMPLANT
CUFF TOURNIQUET SINGLE 18IN (TOURNIQUET CUFF) ×3 IMPLANT
DRAPE EXTREMITY T 121X128X90 (DRAPE) ×3 IMPLANT
DRAPE SURG 17X23 STRL (DRAPES) ×3 IMPLANT
DRSG PAD ABDOMINAL 8X10 ST (GAUZE/BANDAGES/DRESSINGS) ×3 IMPLANT
GAUZE XEROFORM 1X8 LF (GAUZE/BANDAGES/DRESSINGS) ×3 IMPLANT
GLOVE BIO SURGEON STRL SZ7 (GLOVE) ×3 IMPLANT
GLOVE BIO SURGEON STRL SZ7.5 (GLOVE) ×3 IMPLANT
GLOVE BIOGEL PI IND STRL 7.5 (GLOVE) ×2 IMPLANT
GLOVE BIOGEL PI INDICATOR 7.5 (GLOVE) ×1
GLOVE EXAM NITRILE EXT CUFF MD (GLOVE) ×3 IMPLANT
GLOVE SKINSENSE NS SZ7.0 (GLOVE) ×1
GLOVE SKINSENSE STRL SZ7.0 (GLOVE) ×2 IMPLANT
GLOVE SURG ORTHO 8.0 STRL STRW (GLOVE) ×3 IMPLANT
GOWN PREVENTION PLUS XLARGE (GOWN DISPOSABLE) ×6 IMPLANT
GOWN STRL REIN XL XLG (GOWN DISPOSABLE) ×6 IMPLANT
NEEDLE HYPO 25X1 1.5 SAFETY (NEEDLE) ×3 IMPLANT
NS IRRIG 1000ML POUR BTL (IV SOLUTION) ×3 IMPLANT
PACK BASIN DAY SURGERY FS (CUSTOM PROCEDURE TRAY) ×3 IMPLANT
PADDING CAST ABS 4INX4YD NS (CAST SUPPLIES)
PADDING CAST ABS COTTON 4X4 ST (CAST SUPPLIES) IMPLANT
SPONGE GAUZE 4X4 12PLY (GAUZE/BANDAGES/DRESSINGS) ×3 IMPLANT
STOCKINETTE 4X48 STRL (DRAPES) ×3 IMPLANT
SUT ETHILON 4 0 PS 2 18 (SUTURE) ×6 IMPLANT
SUT VICRYL 4-0 PS2 18IN ABS (SUTURE) ×3 IMPLANT
SYR BULB 3OZ (MISCELLANEOUS) ×3 IMPLANT
SYR CONTROL 10ML LL (SYRINGE) ×3 IMPLANT
TOWEL OR 17X24 6PK STRL BLUE (TOWEL DISPOSABLE) ×3 IMPLANT
UNDERPAD 30X30 INCONTINENT (UNDERPADS AND DIAPERS) ×3 IMPLANT
WATER STERILE IRR 1000ML POUR (IV SOLUTION) IMPLANT

## 2011-12-15 NOTE — Progress Notes (Signed)
Pt states visitor here is not staying with him tonight. Pt gave phone number of [place] [facility] [phone]. Bethann Berkshire called and she states she is able to stay with the pt tonight.

## 2011-12-15 NOTE — Anesthesia Postprocedure Evaluation (Signed)
  Anesthesia Post-op Note  Patient: Matthew Sloan  Procedure(s) Performed: Procedure(s) (LRB): CUBITAL TUNNEL RELEASE (Left) CARPAL TUNNEL RELEASE (Left)  Patient Location: PACU  Anesthesia Type: General  Level of Consciousness: awake, alert  and oriented  Airway and Oxygen Therapy: Patient Spontanous Breathing  Post-op Pain: mild  Post-op Assessment: Post-op Vital signs reviewed, Patient's Cardiovascular Status Stable, Respiratory Function Stable, Patent Airway, No signs of Nausea or vomiting and Pain level controlled  Post-op Vital Signs: Reviewed and stable  Complications: No apparent anesthesia complications

## 2011-12-15 NOTE — Transfer of Care (Signed)
Immediate Anesthesia Transfer of Care Note  Patient: Matthew Sloan  Procedure(s) Performed: Procedure(s) (LRB): CUBITAL TUNNEL RELEASE (Left) CARPAL TUNNEL RELEASE (Left)  Patient Location: PACU  Anesthesia Type: General  Level of Consciousness: sedated  Airway & Oxygen Therapy: Patient Spontanous Breathing and Patient connected to face mask oxygen  Post-op Assessment: Report given to PACU RN and Post -op Vital signs reviewed and stable  Post vital signs: Reviewed and stable  Complications: No apparent anesthesia complications

## 2011-12-15 NOTE — Discharge Instructions (Addendum)

## 2011-12-15 NOTE — Anesthesia Preprocedure Evaluation (Signed)
Anesthesia Evaluation  Patient identified by MRN, date of birth, ID band Patient awake    Reviewed: Allergy & Precautions, H&P , NPO status , Patient's Chart, lab work & pertinent test results  History of Anesthesia Complications Negative for: history of anesthetic complications  Airway Mallampati: II TM Distance: >3 FB Neck ROM: Full    Dental  (+) Edentulous Upper, Poor Dentition and Dental Advisory Given   Pulmonary Current Smoker,  breath sounds clear to auscultation  Pulmonary exam normal       Cardiovascular negative cardio ROS  Rhythm:Regular Rate:Normal     Neuro/Psych  Neuromuscular disease (chronic back pain: narcotics daily)    GI/Hepatic negative GI ROS, Neg liver ROS,   Endo/Other  Morbid obesity  Renal/GU negative Renal ROS     Musculoskeletal   Abdominal (+) + obese,   Peds  Hematology negative hematology ROS (+)   Anesthesia Other Findings   Reproductive/Obstetrics                           Anesthesia Physical Anesthesia Plan  ASA: II  Anesthesia Plan: General   Post-op Pain Management:    Induction: Intravenous  Airway Management Planned: LMA  Additional Equipment:   Intra-op Plan:   Post-operative Plan:   Informed Consent: I have reviewed the patients History and Physical, chart, labs and discussed the procedure including the risks, benefits and alternatives for the proposed anesthesia with the patient or authorized representative who has indicated his/her understanding and acceptance.   Dental advisory given  Plan Discussed with: Surgeon and CRNA  Anesthesia Plan Comments: (Plan routine monitors, GA- LMA OK)        Anesthesia Quick Evaluation
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
## 2011-12-15 NOTE — Progress Notes (Signed)
See Doc flow sheet re: respiratory status and Dr Jean Rosenthal order for proventil. Per Dr Jean Rosenthal sent Proventil inhaler home with pt to use 2 puffs q4hrs for next 24hrs. Demonstrated proper use of inhaler. Also instructed on cough and deep breathing q2hrs while awake. Pt and friend verbalized understanding. Call if problems or questions
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
## 2011-12-15 NOTE — Op Note (Signed)
Dictation [id number]

## 2011-12-15 NOTE — Addendum Note (Signed)
Addendum  created 12/15/11 1301 by Germaine Pomfret, MD   Modules edited:Orders

## 2011-12-17 ENCOUNTER — Encounter (HOSPITAL_BASED_OUTPATIENT_CLINIC_OR_DEPARTMENT_OTHER): Payer: Self-pay | Admitting: Orthopedic Surgery

## 2012-01-01 ENCOUNTER — Other Ambulatory Visit: Payer: Self-pay | Admitting: *Deleted

## 2012-01-01 MED ORDER — HYDROCODONE-ACETAMINOPHEN 10-325 MG PO TABS
1.0000 | ORAL_TABLET | Freq: Four times a day (QID) | ORAL | Status: DC | PRN
Start: 2012-01-01 — End: 2012-01-12

## 2012-01-12 ENCOUNTER — Encounter: Payer: Self-pay | Admitting: Physical Medicine and Rehabilitation

## 2012-01-12 ENCOUNTER — Encounter
Payer: Worker's Compensation | Attending: Physical Medicine & Rehabilitation | Admitting: Physical Medicine and Rehabilitation

## 2012-01-12 VITALS — BP 143/91 | HR 75 | Resp 16 | Ht 69.0 in | Wt 237.8 lb

## 2012-01-12 DIAGNOSIS — F17200 Nicotine dependence, unspecified, uncomplicated: Secondary | ICD-10-CM | POA: Insufficient documentation

## 2012-01-12 DIAGNOSIS — M25551 Pain in right hip: Secondary | ICD-10-CM

## 2012-01-12 DIAGNOSIS — M545 Low back pain: Secondary | ICD-10-CM

## 2012-01-12 DIAGNOSIS — M25559 Pain in unspecified hip: Secondary | ICD-10-CM

## 2012-01-12 DIAGNOSIS — M79609 Pain in unspecified limb: Secondary | ICD-10-CM | POA: Insufficient documentation

## 2012-01-12 DIAGNOSIS — M549 Dorsalgia, unspecified: Secondary | ICD-10-CM | POA: Insufficient documentation

## 2012-01-12 DIAGNOSIS — G8921 Chronic pain due to trauma: Secondary | ICD-10-CM

## 2012-01-12 NOTE — Patient Instructions (Signed)
Continue with PT, advised patient to walk in his brother's pool. Continue with medication.
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
## 2012-02-09 ENCOUNTER — Other Ambulatory Visit: Payer: Self-pay

## 2012-02-09 MED ORDER — HYDROCODONE-ACETAMINOPHEN 10-325 MG PO TABS: 1.0000 | ORAL_TABLET | Freq: Four times a day (QID) | ORAL | Status: DC | PRN

## 2012-03-29 ENCOUNTER — Other Ambulatory Visit: Payer: Self-pay | Admitting: *Deleted

## 2012-03-29 MED ORDER — HYDROCODONE-ACETAMINOPHEN 10-325 MG PO TABS: 1.0000 | ORAL_TABLET | Freq: Four times a day (QID) | ORAL | Status: DC | PRN

## 2012-04-12 HISTORY — PX: OTHER SURGICAL HISTORY: SHX169

## 2012-04-13 ENCOUNTER — Encounter
Payer: Worker's Compensation | Attending: Physical Medicine and Rehabilitation | Admitting: Physical Medicine and Rehabilitation

## 2012-04-13 ENCOUNTER — Encounter: Payer: Self-pay | Admitting: Physical Medicine and Rehabilitation

## 2012-04-13 VITALS — BP 145/95 | HR 76 | Resp 14 | Ht 69.5 in | Wt 246.8 lb

## 2012-04-13 DIAGNOSIS — G8921 Chronic pain due to trauma: Secondary | ICD-10-CM

## 2012-04-13 DIAGNOSIS — R269 Unspecified abnormalities of gait and mobility: Secondary | ICD-10-CM | POA: Insufficient documentation

## 2012-04-13 DIAGNOSIS — G8929 Other chronic pain: Secondary | ICD-10-CM | POA: Insufficient documentation

## 2012-04-13 DIAGNOSIS — M79609 Pain in unspecified limb: Secondary | ICD-10-CM | POA: Insufficient documentation

## 2012-04-13 DIAGNOSIS — M549 Dorsalgia, unspecified: Secondary | ICD-10-CM | POA: Insufficient documentation

## 2012-04-13 DIAGNOSIS — Z8781 Personal history of (healed) traumatic fracture: Secondary | ICD-10-CM | POA: Insufficient documentation

## 2012-04-13 MED ORDER — GABAPENTIN 800 MG PO TABS: 600.0000 mg | ORAL_TABLET | Freq: Three times a day (TID) | ORAL | Status: DC
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
## 2012-04-13 NOTE — Patient Instructions (Addendum)
Stay as active as tolerated. Start with aquatic therapy.

## 2012-05-13 ENCOUNTER — Other Ambulatory Visit: Payer: Self-pay | Admitting: *Deleted

## 2012-05-13 MED ORDER — GABAPENTIN 800 MG PO TABS: 600.0000 mg | ORAL_TABLET | Freq: Three times a day (TID) | ORAL | Status: DC

## 2012-05-13 MED ORDER — HYDROCODONE-ACETAMINOPHEN 10-325 MG PO TABS: 1.0000 | ORAL_TABLET | Freq: Four times a day (QID) | ORAL | Status: DC | PRN

## 2012-06-30 ENCOUNTER — Telehealth: Payer: Self-pay | Admitting: *Deleted

## 2012-06-30 MED ORDER — HYDROCODONE-ACETAMINOPHEN 10-325 MG PO TABS: 1.0000 | ORAL_TABLET | Freq: Four times a day (QID) | ORAL | Status: DC | PRN

## 2012-06-30 MED ORDER — GABAPENTIN 800 MG PO TABS: 600.0000 mg | ORAL_TABLET | Freq: Three times a day (TID) | ORAL | Status: DC

## 2012-06-30 NOTE — Telephone Encounter (Signed)
Patient needs refill on Hydrocodone and Gabapentin. Pharmacy should be faxing request

## 2012-06-30 NOTE — Telephone Encounter (Signed)
Prescriptions were Verbally called into pharmacy. Patient aware.

## 2012-07-14 ENCOUNTER — Encounter
Payer: Worker's Compensation | Attending: Physical Medicine and Rehabilitation | Admitting: Physical Medicine and Rehabilitation

## 2012-07-14 ENCOUNTER — Encounter: Payer: Self-pay | Admitting: Physical Medicine and Rehabilitation

## 2012-07-14 VITALS — BP 152/105 | HR 72 | Resp 14 | Ht 69.0 in | Wt 248.4 lb

## 2012-07-14 DIAGNOSIS — G8921 Chronic pain due to trauma: Secondary | ICD-10-CM

## 2012-07-14 DIAGNOSIS — G8929 Other chronic pain: Secondary | ICD-10-CM | POA: Insufficient documentation

## 2012-07-14 DIAGNOSIS — R269 Unspecified abnormalities of gait and mobility: Secondary | ICD-10-CM | POA: Insufficient documentation

## 2012-07-14 DIAGNOSIS — M47817 Spondylosis without myelopathy or radiculopathy, lumbosacral region: Secondary | ICD-10-CM

## 2012-07-14 DIAGNOSIS — Z8781 Personal history of (healed) traumatic fracture: Secondary | ICD-10-CM | POA: Insufficient documentation

## 2012-07-14 DIAGNOSIS — G548 Other nerve root and plexus disorders: Secondary | ICD-10-CM | POA: Insufficient documentation

## 2012-07-14 DIAGNOSIS — M47816 Spondylosis without myelopathy or radiculopathy, lumbar region: Secondary | ICD-10-CM

## 2012-07-14 NOTE — Patient Instructions (Signed)
Continue with your exercise and walking program, continue with aquatic therapy.
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
## 2012-08-03 ENCOUNTER — Telehealth: Payer: Self-pay | Admitting: *Deleted

## 2012-08-03 NOTE — Telephone Encounter (Signed)
New referral for aqua therapy.  Faxed to [phone]

## 2012-08-05 ENCOUNTER — Telehealth: Payer: Self-pay | Admitting: *Deleted

## 2012-08-05 DIAGNOSIS — M47817 Spondylosis without myelopathy or radiculopathy, lumbosacral region: Secondary | ICD-10-CM

## 2012-08-05 DIAGNOSIS — G8921 Chronic pain due to trauma: Secondary | ICD-10-CM

## 2012-08-05 NOTE — Telephone Encounter (Signed)
Tammy at [facility] is requesting new Prescription for Auquatic Therapy. She did not receive the last order. Please fax to [phone]

## 2012-08-12 ENCOUNTER — Other Ambulatory Visit: Payer: Self-pay | Admitting: Physical Medicine & Rehabilitation

## 2012-08-12 ENCOUNTER — Other Ambulatory Visit: Payer: Self-pay | Admitting: Physical Medicine and Rehabilitation

## 2012-09-30 ENCOUNTER — Telehealth: Payer: Self-pay | Admitting: *Deleted

## 2012-09-30 MED ORDER — HYDROCODONE-ACETAMINOPHEN 10-325 MG PO TABS: 1.0000 | ORAL_TABLET | Freq: Four times a day (QID) | ORAL | Status: DC | PRN

## 2012-09-30 NOTE — Telephone Encounter (Signed)
Refill request on hydrocodone 10/325. Last fill 08/12/12. Refilled and [name] notified.

## 2012-10-10 ENCOUNTER — Encounter: Payer: Self-pay | Admitting: Physical Medicine and Rehabilitation

## 2012-10-10 ENCOUNTER — Encounter
Payer: Worker's Compensation | Attending: Physical Medicine and Rehabilitation | Admitting: Physical Medicine and Rehabilitation

## 2012-10-10 VITALS — BP 138/86 | HR 77 | Resp 16 | Ht 69.0 in | Wt 257.0 lb

## 2012-10-10 DIAGNOSIS — F17200 Nicotine dependence, unspecified, uncomplicated: Secondary | ICD-10-CM | POA: Insufficient documentation

## 2012-10-10 DIAGNOSIS — Z5181 Encounter for therapeutic drug level monitoring: Secondary | ICD-10-CM

## 2012-10-10 DIAGNOSIS — Z79899 Other long term (current) drug therapy: Secondary | ICD-10-CM

## 2012-10-10 DIAGNOSIS — G8921 Chronic pain due to trauma: Secondary | ICD-10-CM | POA: Insufficient documentation

## 2012-10-10 DIAGNOSIS — M79609 Pain in unspecified limb: Secondary | ICD-10-CM | POA: Insufficient documentation

## 2012-10-10 DIAGNOSIS — G548 Other nerve root and plexus disorders: Secondary | ICD-10-CM | POA: Insufficient documentation

## 2012-10-10 DIAGNOSIS — M549 Dorsalgia, unspecified: Secondary | ICD-10-CM | POA: Insufficient documentation

## 2012-10-10 LAB — HEPATIC FUNCTION PANEL
Albumin: 4.3 g/dL (ref 3.5–5.2)
Alkaline Phosphatase: 50 U/L (ref 39–117)
Total Bilirubin: 0.4 mg/dL (ref 0.3–1.2)

## 2012-10-10 NOTE — Patient Instructions (Signed)
Continue with your aquatic therapy, and your exercise program.
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
## 2012-10-11 ENCOUNTER — Telehealth: Payer: Self-pay

## 2012-10-11 NOTE — Telephone Encounter (Signed)
Message copied by Judd Gaudier on Tue Oct 11, 2012  3:03 PM ------      Message from: Su Monks      Created: Tue Oct 11, 2012  8:51 AM       Please inform patient that his liver function test was in normal range, renal fct is still pending ------

## 2012-10-11 NOTE — Telephone Encounter (Signed)
Patient informed of normal liver function test.

## 2012-11-14 ENCOUNTER — Telehealth: Payer: Self-pay | Admitting: *Deleted

## 2012-11-14 MED ORDER — HYDROCODONE-ACETAMINOPHEN 10-325 MG PO TABS: 1.0000 | ORAL_TABLET | Freq: Four times a day (QID) | ORAL | Status: DC | PRN

## 2012-11-14 NOTE — Telephone Encounter (Signed)
Yes, we can prescribe Robaxin 500mg  tid, prn muscle spasms/pain # 90

## 2012-11-14 NOTE — Telephone Encounter (Signed)
Refill hydrocodone.  Mentioned Robaxin refill but I can't see where we rx'd that in the past year.  I left him a message that I would refill the hydrocodone and for him to call with more information about the robaxin.  Clydie Braun, do you want to rx the robaxin for him?

## 2012-11-15 MED ORDER — METHOCARBAMOL 500 MG PO TABS: 500.0000 mg | ORAL_TABLET | Freq: Three times a day (TID) | ORAL | Status: DC

## 2012-11-15 NOTE — Telephone Encounter (Signed)
Notified Matthew Sloan that his robaxin was sent to pharmacy

## 2012-12-23 ENCOUNTER — Telehealth: Payer: Self-pay

## 2012-12-23 NOTE — Telephone Encounter (Signed)
Patient called requesting hydrocodone and trazedone refills.  Please advise.

## 2012-12-26 MED ORDER — HYDROCODONE-ACETAMINOPHEN 10-325 MG PO TABS: 1.0000 | ORAL_TABLET | Freq: Four times a day (QID) | ORAL | Status: DC | PRN

## 2012-12-26 MED ORDER — TRAZODONE HCL 50 MG PO TABS: ORAL_TABLET | ORAL | Status: DC

## 2012-12-26 NOTE — Telephone Encounter (Signed)
Hydrocodone and trazedone called into pharmacy.  Patient aware.

## 2012-12-26 NOTE — Telephone Encounter (Signed)
It is ok, but he should keep his next appointment

## 2013-01-02 ENCOUNTER — Encounter: Payer: Self-pay | Admitting: Physical Medicine and Rehabilitation

## 2013-01-02 ENCOUNTER — Encounter
Payer: Worker's Compensation | Attending: Physical Medicine and Rehabilitation | Admitting: Physical Medicine and Rehabilitation

## 2013-01-02 VITALS — BP 151/92 | HR 73 | Resp 16 | Ht 69.0 in | Wt 245.0 lb

## 2013-01-02 DIAGNOSIS — G8929 Other chronic pain: Secondary | ICD-10-CM | POA: Insufficient documentation

## 2013-01-02 DIAGNOSIS — G8921 Chronic pain due to trauma: Secondary | ICD-10-CM

## 2013-01-02 DIAGNOSIS — M79609 Pain in unspecified limb: Secondary | ICD-10-CM | POA: Insufficient documentation

## 2013-01-02 DIAGNOSIS — R209 Unspecified disturbances of skin sensation: Secondary | ICD-10-CM | POA: Insufficient documentation

## 2013-01-02 DIAGNOSIS — G548 Other nerve root and plexus disorders: Secondary | ICD-10-CM | POA: Insufficient documentation

## 2013-01-02 DIAGNOSIS — M47817 Spondylosis without myelopathy or radiculopathy, lumbosacral region: Secondary | ICD-10-CM

## 2013-01-02 DIAGNOSIS — M48061 Spinal stenosis, lumbar region without neurogenic claudication: Secondary | ICD-10-CM | POA: Insufficient documentation
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
## 2013-01-02 NOTE — Patient Instructions (Signed)
Continue with your walking and exercise program as tolerated 

## 2013-01-04 ENCOUNTER — Ambulatory Visit: Payer: Self-pay | Admitting: Physical Medicine and Rehabilitation

## 2013-02-14 ENCOUNTER — Telehealth: Payer: Self-pay | Admitting: Physical Medicine & Rehabilitation

## 2013-02-14 NOTE — Telephone Encounter (Signed)
Tammy with [facility] called about a prescription for more therapy, please call or sent to [phone]

## 2013-02-16 ENCOUNTER — Other Ambulatory Visit: Payer: Self-pay

## 2013-02-16 MED ORDER — METHOCARBAMOL 500 MG PO TABS: 500.0000 mg | ORAL_TABLET | Freq: Three times a day (TID) | ORAL | Status: DC

## 2013-02-16 MED ORDER — TRAZODONE HCL 50 MG PO TABS: ORAL_TABLET | ORAL | Status: DC

## 2013-02-16 MED ORDER — HYDROCODONE-ACETAMINOPHEN 10-325 MG PO TABS: 1.0000 | ORAL_TABLET | Freq: Four times a day (QID) | ORAL | Status: DC | PRN

## 2013-02-24 ENCOUNTER — Encounter
Payer: Worker's Compensation | Attending: Physical Medicine and Rehabilitation | Admitting: Physical Medicine and Rehabilitation

## 2013-02-24 ENCOUNTER — Encounter: Payer: Self-pay | Admitting: Physical Medicine and Rehabilitation

## 2013-02-24 VITALS — BP 165/92 | HR 80 | Resp 14 | Ht 69.0 in | Wt 240.0 lb

## 2013-02-24 DIAGNOSIS — Z5181 Encounter for therapeutic drug level monitoring: Secondary | ICD-10-CM

## 2013-02-24 DIAGNOSIS — M545 Low back pain, unspecified: Secondary | ICD-10-CM | POA: Insufficient documentation

## 2013-02-24 DIAGNOSIS — G8921 Chronic pain due to trauma: Secondary | ICD-10-CM

## 2013-02-24 DIAGNOSIS — M79609 Pain in unspecified limb: Secondary | ICD-10-CM | POA: Insufficient documentation

## 2013-02-24 DIAGNOSIS — Z79899 Other long term (current) drug therapy: Secondary | ICD-10-CM

## 2013-02-24 DIAGNOSIS — M47817 Spondylosis without myelopathy or radiculopathy, lumbosacral region: Secondary | ICD-10-CM

## 2013-02-24 DIAGNOSIS — M4800 Spinal stenosis, site unspecified: Secondary | ICD-10-CM | POA: Insufficient documentation

## 2013-02-24 DIAGNOSIS — G8929 Other chronic pain: Secondary | ICD-10-CM | POA: Insufficient documentation

## 2013-02-24 MED ORDER — DICLOFENAC EPOLAMINE 1.3 % TD PTCH: 1.0000 | MEDICATED_PATCH | Freq: Two times a day (BID) | TRANSDERMAL | Status: DC
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
## 2013-02-24 NOTE — Patient Instructions (Signed)
Continue with PT and aquatic PT and exercising in the pool of your brother

## 2013-02-28 ENCOUNTER — Ambulatory Visit: Payer: Self-pay | Admitting: Physical Medicine and Rehabilitation

## 2013-03-20 ENCOUNTER — Other Ambulatory Visit: Payer: Self-pay

## 2013-03-20 MED ORDER — HYDROCODONE-ACETAMINOPHEN 10-325 MG PO TABS: 1.0000 | ORAL_TABLET | Freq: Four times a day (QID) | ORAL | Status: DC | PRN

## 2013-03-21 ENCOUNTER — Telehealth: Payer: Self-pay

## 2013-03-21 NOTE — Telephone Encounter (Signed)
[employer] needs a peer to peer phone call regarding flector patch request.  Please contact Dr Baron Hamper at [phone].

## 2013-03-22 NOTE — Telephone Encounter (Signed)
Called Dr. Baron Hamper, we will hear from them , whether they will approve

## 2013-04-18 ENCOUNTER — Encounter: Payer: Self-pay | Admitting: Physical Medicine and Rehabilitation

## 2013-04-18 ENCOUNTER — Telehealth: Payer: Self-pay

## 2013-04-18 ENCOUNTER — Encounter
Payer: Worker's Compensation | Attending: Physical Medicine and Rehabilitation | Admitting: Physical Medicine and Rehabilitation

## 2013-04-18 VITALS — BP 147/80 | HR 81 | Resp 16 | Ht 69.0 in | Wt 234.0 lb

## 2013-04-18 DIAGNOSIS — Z79899 Other long term (current) drug therapy: Secondary | ICD-10-CM | POA: Insufficient documentation

## 2013-04-18 DIAGNOSIS — M47817 Spondylosis without myelopathy or radiculopathy, lumbosacral region: Secondary | ICD-10-CM

## 2013-04-18 DIAGNOSIS — G548 Other nerve root and plexus disorders: Secondary | ICD-10-CM | POA: Insufficient documentation

## 2013-04-18 DIAGNOSIS — G8921 Chronic pain due to trauma: Secondary | ICD-10-CM | POA: Insufficient documentation

## 2013-04-18 DIAGNOSIS — M549 Dorsalgia, unspecified: Secondary | ICD-10-CM | POA: Insufficient documentation

## 2013-04-18 DIAGNOSIS — R209 Unspecified disturbances of skin sensation: Secondary | ICD-10-CM | POA: Insufficient documentation

## 2013-04-18 MED ORDER — HYDROCODONE-ACETAMINOPHEN 10-325 MG PO TABS: 1.0000 | ORAL_TABLET | Freq: Four times a day (QID) | ORAL | Status: DC | PRN

## 2013-04-18 MED ORDER — GABAPENTIN 800 MG PO TABS: 600.0000 mg | ORAL_TABLET | Freq: Three times a day (TID) | ORAL | Status: DC

## 2013-04-18 MED ORDER — METHOCARBAMOL 500 MG PO TABS: 500.0000 mg | ORAL_TABLET | Freq: Three times a day (TID) | ORAL | Status: DC

## 2013-04-18 MED ORDER — TRAZODONE HCL 50 MG PO TABS: ORAL_TABLET | ORAL | Status: DC

## 2013-04-18 NOTE — Patient Instructions (Signed)
Continue with your exercise program 

## 2013-04-18 NOTE — Telephone Encounter (Signed)
Refill request for methocarbamol 500mg , gabapentin 800mg  and trazodone 50mg .  Please advise.

## 2013-04-18 NOTE — Telephone Encounter (Signed)
It is ok to refill 

## 2013-04-18 NOTE — Telephone Encounter (Signed)
Refills sent electronically
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
## 2013-05-19 ENCOUNTER — Encounter
Payer: Worker's Compensation | Attending: Physical Medicine and Rehabilitation | Admitting: Physical Medicine and Rehabilitation

## 2013-05-19 ENCOUNTER — Encounter (INDEPENDENT_AMBULATORY_CARE_PROVIDER_SITE_OTHER): Payer: Self-pay

## 2013-05-19 ENCOUNTER — Encounter: Payer: Self-pay | Admitting: Physical Medicine and Rehabilitation

## 2013-05-19 VITALS — BP 146/63 | HR 80 | Resp 16 | Ht 69.0 in | Wt 226.0 lb

## 2013-05-19 DIAGNOSIS — G8921 Chronic pain due to trauma: Secondary | ICD-10-CM

## 2013-05-19 DIAGNOSIS — S32009A Unspecified fracture of unspecified lumbar vertebra, initial encounter for closed fracture: Secondary | ICD-10-CM | POA: Insufficient documentation

## 2013-05-19 DIAGNOSIS — G548 Other nerve root and plexus disorders: Secondary | ICD-10-CM | POA: Insufficient documentation

## 2013-05-19 DIAGNOSIS — G8929 Other chronic pain: Secondary | ICD-10-CM | POA: Insufficient documentation

## 2013-05-19 DIAGNOSIS — M47817 Spondylosis without myelopathy or radiculopathy, lumbosacral region: Secondary | ICD-10-CM

## 2013-05-19 MED ORDER — HYDROCODONE-ACETAMINOPHEN 10-325 MG PO TABS: 1.0000 | ORAL_TABLET | Freq: Four times a day (QID) | ORAL | Status: DC | PRN

## 2013-05-19 MED ORDER — DICLOFENAC EPOLAMINE 1.3 % TD PTCH: 1.0000 | MEDICATED_PATCH | Freq: Two times a day (BID) | TRANSDERMAL | Status: DC
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
## 2013-05-19 NOTE — Patient Instructions (Signed)
Continue with your exercise program as tolerated 

## 2013-05-25 ENCOUNTER — Ambulatory Visit: Payer: Self-pay | Admitting: Physical Medicine and Rehabilitation

## 2013-06-13 ENCOUNTER — Other Ambulatory Visit: Payer: Self-pay | Admitting: *Deleted

## 2013-06-13 MED ORDER — HYDROCODONE-ACETAMINOPHEN 10-325 MG PO TABS: 1.0000 | ORAL_TABLET | Freq: Four times a day (QID) | ORAL | Status: DC | PRN

## 2013-06-13 NOTE — Telephone Encounter (Signed)
rx printed early for controlled medication for the visit with RN on 06/19/13 (to be signed by MD) 

## 2013-06-19 ENCOUNTER — Ambulatory Visit: Payer: Self-pay | Admitting: Physical Medicine and Rehabilitation

## 2013-06-19 ENCOUNTER — Encounter: Payer: Worker's Compensation | Attending: Physical Medicine & Rehabilitation | Admitting: *Deleted

## 2013-06-19 ENCOUNTER — Encounter: Payer: Self-pay | Admitting: *Deleted

## 2013-06-19 VITALS — BP 141/82 | HR 75 | Resp 14 | Ht 69.0 in | Wt 213.0 lb

## 2013-06-19 DIAGNOSIS — G548 Other nerve root and plexus disorders: Secondary | ICD-10-CM

## 2013-06-19 DIAGNOSIS — IMO0002 Reserved for concepts with insufficient information to code with codable children: Secondary | ICD-10-CM

## 2013-06-19 DIAGNOSIS — G8929 Other chronic pain: Secondary | ICD-10-CM | POA: Insufficient documentation

## 2013-06-19 DIAGNOSIS — S32009A Unspecified fracture of unspecified lumbar vertebra, initial encounter for closed fracture: Secondary | ICD-10-CM | POA: Insufficient documentation

## 2013-06-19 DIAGNOSIS — M519 Unspecified thoracic, thoracolumbar and lumbosacral intervertebral disc disorder: Secondary | ICD-10-CM

## 2013-06-19 DIAGNOSIS — G8921 Chronic pain due to trauma: Secondary | ICD-10-CM

## 2013-06-19 NOTE — Progress Notes (Signed)
Here for pill count and medication refills. Fill date  05/19/13 #120    Today NV# 29  .  Pill count appropriate. VSS.  No changes in medication or in his pain, other than cold weather making him stiff.  Follow up in one month for refill and pill count.

## 2013-06-19 NOTE — Patient Instructions (Signed)
Follow up one month with RN for pill count and med refill 

## 2013-07-17 ENCOUNTER — Other Ambulatory Visit: Payer: Self-pay | Admitting: *Deleted

## 2013-07-17 MED ORDER — HYDROCODONE-ACETAMINOPHEN 10-325 MG PO TABS: 1.0000 | ORAL_TABLET | Freq: Four times a day (QID) | ORAL | Status: DC | PRN

## 2013-07-17 NOTE — Telephone Encounter (Signed)
RX printed early for controlled medication for the visit with RN on 07/21/13 (to be signed by MD) 

## 2013-07-21 ENCOUNTER — Encounter: Payer: Worker's Compensation | Attending: Physical Medicine & Rehabilitation | Admitting: *Deleted

## 2013-07-21 ENCOUNTER — Encounter: Payer: Self-pay | Admitting: *Deleted

## 2013-07-21 VITALS — BP 126/77 | HR 97 | Resp 14

## 2013-07-21 DIAGNOSIS — M48061 Spinal stenosis, lumbar region without neurogenic claudication: Secondary | ICD-10-CM | POA: Diagnosis not present

## 2013-07-21 DIAGNOSIS — F17200 Nicotine dependence, unspecified, uncomplicated: Secondary | ICD-10-CM | POA: Insufficient documentation

## 2013-07-21 DIAGNOSIS — IMO0002 Reserved for concepts with insufficient information to code with codable children: Secondary | ICD-10-CM | POA: Diagnosis not present

## 2013-07-21 DIAGNOSIS — G8921 Chronic pain due to trauma: Secondary | ICD-10-CM | POA: Diagnosis not present

## 2013-07-21 DIAGNOSIS — R209 Unspecified disturbances of skin sensation: Secondary | ICD-10-CM | POA: Diagnosis not present

## 2013-07-21 DIAGNOSIS — M79609 Pain in unspecified limb: Secondary | ICD-10-CM | POA: Insufficient documentation

## 2013-07-21 DIAGNOSIS — M519 Unspecified thoracic, thoracolumbar and lumbosacral intervertebral disc disorder: Secondary | ICD-10-CM

## 2013-07-21 NOTE — Patient Instructions (Signed)
Follow up with MD in one month.

## 2013-07-21 NOTE — Progress Notes (Signed)
Here for pill count and medication refills. Hydrocodone 10/325  # 120 Fill date 06/13/13   Today NV# 14  VSS    Pain level:8  Pill counts appropriate.  Refill given.  The cold weather is causing a lot of pain, particularly in his rib area.  Return appt to see MD in one month.

## 2013-08-25 ENCOUNTER — Encounter: Payer: Self-pay | Admitting: Physical Medicine & Rehabilitation

## 2013-08-25 ENCOUNTER — Ambulatory Visit (HOSPITAL_BASED_OUTPATIENT_CLINIC_OR_DEPARTMENT_OTHER): Payer: Worker's Compensation | Admitting: Physical Medicine & Rehabilitation

## 2013-08-25 ENCOUNTER — Encounter: Payer: Worker's Compensation | Attending: Physical Medicine and Rehabilitation

## 2013-08-25 VITALS — BP 151/85 | HR 94 | Resp 14 | Ht 69.0 in | Wt 196.2 lb

## 2013-08-25 DIAGNOSIS — M519 Unspecified thoracic, thoracolumbar and lumbosacral intervertebral disc disorder: Secondary | ICD-10-CM

## 2013-08-25 DIAGNOSIS — G548 Other nerve root and plexus disorders: Secondary | ICD-10-CM | POA: Insufficient documentation

## 2013-08-25 DIAGNOSIS — G8929 Other chronic pain: Secondary | ICD-10-CM

## 2013-08-25 DIAGNOSIS — G8921 Chronic pain due to trauma: Secondary | ICD-10-CM

## 2013-08-25 DIAGNOSIS — S32009A Unspecified fracture of unspecified lumbar vertebra, initial encounter for closed fracture: Secondary | ICD-10-CM | POA: Insufficient documentation

## 2013-08-25 DIAGNOSIS — M25559 Pain in unspecified hip: Secondary | ICD-10-CM

## 2013-08-25 DIAGNOSIS — M25551 Pain in right hip: Secondary | ICD-10-CM

## 2013-08-25 MED ORDER — TRAZODONE HCL 50 MG PO TABS: ORAL_TABLET | ORAL | Status: DC

## 2013-08-25 MED ORDER — HYDROCODONE-ACETAMINOPHEN 10-325 MG PO TABS: 1.0000 | ORAL_TABLET | Freq: Four times a day (QID) | ORAL | Status: DC | PRN

## 2013-08-25 MED ORDER — GABAPENTIN 800 MG PO TABS: 800.0000 mg | ORAL_TABLET | Freq: Three times a day (TID) | ORAL | Status: DC

## 2013-08-25 NOTE — Progress Notes (Signed)
Subjective:    Patient ID: Matthew Sloan, male    DOB: 1961-04-26, 53 y.o.   MRN: [id number]  HPI Last visit with me October 12 2011, at that time taking 2 hydrocodone per day back on 4 tabs per day Interval hx Has had Heartburn sx but hasn't seen PCP yet Left shoulder rotator cuff repair   Pain Inventory Average Pain 8 Pain Right Now 8 My pain is sharp, burning, stabbing, tingling and aching  In the last 24 hours, has pain interfered with the following? General activity 10 Relation with others 10 Enjoyment of life 10 What TIME of day is your pain at its worst? all Sleep (in general) Poor  Pain is worse with: walking, bending, sitting, inactivity and standing Pain improves with: rest and medication Relief from Meds: 3  Mobility use a cane  Function disabled: date disabled .  Neuro/Psych trouble walking  Prior Studies Any changes since last visit?  no  Physicians involved in your care Any changes since last visit?  no   Family History  Problem Relation Age of Onset  . Cancer Mother 34    breast cancer  . Heart disease Father 58    CHF   History   Social History  . Marital Status: Single    Spouse Name: N/A    Number of Children: N/A  . Years of Education: N/A   Social History Main Topics  . Smoking status: Current Every Day Smoker -- 1.00 packs/day for 30 years    Types: Cigarettes  . Smokeless tobacco: Never Used     Comment: working on it-down to 0.5 ppd  . Alcohol Use: No  . Drug Use: No  . Sexual Activity: None   Other Topics Concern  . None   Social History Narrative  . None   Past Surgical History  Procedure Laterality Date  . Shoulder surgery  2012    lt  . Multiple tooth extractions    . Ulnar tunnel release  12/15/2011    Procedure: CUBITAL TUNNEL RELEASE;  Surgeon: [name], MD;  Location: [facility];  Service: Orthopedics;  Laterality: Left;  left cubital tunnel release  . Carpal tunnel release  12/15/2011   Procedure: CARPAL TUNNEL RELEASE;  Surgeon: [name], MD;  Location:  [facility];  Service: Orthopedics;  Laterality: Left;  . Bilateral tubes placed in ears  04/12/2012   Past Medical History  Diagnosis Date  . Lumbosacral neuritis   . Rotator cuff (capsule) sprain   . Disturbance of skin sensation   . Chronic pain due to trauma   . Intercostal neuralgia   . Allergy   . Snores    BP 151/85  Pulse 94  Resp 14  Ht 5\' 9"  (1.753 m)  Wt 196 lb 3.2 oz (88.996 kg)  BMI 28.96 kg/m2  SpO2 92%  Opioid Risk Score:   Fall Risk Score: High Fall Risk (>13 points) (educated and fall prevention in the home handout given) Review of Systems  Gastrointestinal:       Been having a lot of indigestion and burning and even goes into his arm  Musculoskeletal: Positive for back pain and gait problem.  All other systems reviewed and are negative.       Objective:   Physical Exam  Patient has pain with minimal palpation along the thoracic and lumbar paraspinal muscles. Reduced range of motion with extension greater than flexion. Has pain with extension greater than flexion  of the lumbar spine. Pain with hip internal rotation greater than external rotation right greater than left side. Ambulates with a cane. 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 His symptoms have not been responsive to interventional pain procedures. He has had some limited benefit from multidisciplinary pain program. I await the FCE to establish permanent work restrictions.  Cont hydrocodone to 10 mg 4 times a day as well as gabapentin to 800 mg 3 times a day. [name]. He is at MMI from my standpoint.  2.  insomnia related to chronic pain, will re order trazodone

## 2013-08-25 NOTE — Patient Instructions (Signed)
Cont walking at least 30 min per ( x 3)

## 2013-09-21 ENCOUNTER — Encounter: Payer: Self-pay | Admitting: Physical Medicine and Rehabilitation

## 2013-09-21 ENCOUNTER — Encounter
Payer: Worker's Compensation | Attending: Physical Medicine and Rehabilitation | Admitting: Physical Medicine and Rehabilitation

## 2013-09-21 VITALS — BP 159/125 | HR 98 | Resp 14 | Ht 69.0 in | Wt 190.0 lb

## 2013-09-21 DIAGNOSIS — Z5181 Encounter for therapeutic drug level monitoring: Secondary | ICD-10-CM | POA: Insufficient documentation

## 2013-09-21 DIAGNOSIS — I10 Essential (primary) hypertension: Secondary | ICD-10-CM | POA: Insufficient documentation

## 2013-09-21 DIAGNOSIS — G548 Other nerve root and plexus disorders: Secondary | ICD-10-CM

## 2013-09-21 DIAGNOSIS — Z79899 Other long term (current) drug therapy: Secondary | ICD-10-CM

## 2013-09-21 DIAGNOSIS — M519 Unspecified thoracic, thoracolumbar and lumbosacral intervertebral disc disorder: Secondary | ICD-10-CM | POA: Insufficient documentation

## 2013-09-21 DIAGNOSIS — G8929 Other chronic pain: Secondary | ICD-10-CM | POA: Insufficient documentation

## 2013-09-21 DIAGNOSIS — F17200 Nicotine dependence, unspecified, uncomplicated: Secondary | ICD-10-CM | POA: Insufficient documentation

## 2013-09-21 DIAGNOSIS — G8921 Chronic pain due to trauma: Secondary | ICD-10-CM | POA: Insufficient documentation

## 2013-09-21 DIAGNOSIS — M25559 Pain in unspecified hip: Secondary | ICD-10-CM | POA: Insufficient documentation

## 2013-09-21 DIAGNOSIS — M549 Dorsalgia, unspecified: Secondary | ICD-10-CM | POA: Insufficient documentation

## 2013-09-21 MED ORDER — GABAPENTIN 800 MG PO TABS: 800.0000 mg | ORAL_TABLET | Freq: Three times a day (TID) | ORAL | Status: DC

## 2013-09-21 MED ORDER — HYDROCODONE-ACETAMINOPHEN 10-325 MG PO TABS: 1.0000 | ORAL_TABLET | Freq: Four times a day (QID) | ORAL | Status: DC | PRN

## 2013-09-21 MED ORDER — METHOCARBAMOL 500 MG PO TABS: 500.0000 mg | ORAL_TABLET | Freq: Three times a day (TID) | ORAL | Status: DC

## 2013-09-21 NOTE — Patient Instructions (Signed)
1. Go to the emergency room the next time you have an episode of chest pain. Have someone drive you there. Call 911 if no one available to drive you.  2. Call your doctor to get you seen earlier.
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
## 2013-10-09 ENCOUNTER — Other Ambulatory Visit: Payer: Self-pay

## 2013-10-09 NOTE — Progress Notes (Signed)
Urine drug screen was consistent, done 09/21/2013.

## 2013-10-20 ENCOUNTER — Encounter: Payer: Self-pay | Admitting: Registered Nurse

## 2013-10-20 ENCOUNTER — Encounter: Payer: Worker's Compensation | Attending: Registered Nurse | Admitting: Registered Nurse

## 2013-10-20 VITALS — BP 168/125 | HR 103 | Resp 14 | Ht 69.0 in | Wt 183.0 lb

## 2013-10-20 DIAGNOSIS — M25559 Pain in unspecified hip: Secondary | ICD-10-CM

## 2013-10-20 DIAGNOSIS — G548 Other nerve root and plexus disorders: Secondary | ICD-10-CM

## 2013-10-20 DIAGNOSIS — G4700 Insomnia, unspecified: Secondary | ICD-10-CM | POA: Insufficient documentation

## 2013-10-20 DIAGNOSIS — M25519 Pain in unspecified shoulder: Secondary | ICD-10-CM | POA: Insufficient documentation

## 2013-10-20 DIAGNOSIS — I10 Essential (primary) hypertension: Secondary | ICD-10-CM | POA: Insufficient documentation

## 2013-10-20 DIAGNOSIS — F17200 Nicotine dependence, unspecified, uncomplicated: Secondary | ICD-10-CM | POA: Insufficient documentation

## 2013-10-20 DIAGNOSIS — Z5181 Encounter for therapeutic drug level monitoring: Secondary | ICD-10-CM

## 2013-10-20 DIAGNOSIS — G8921 Chronic pain due to trauma: Secondary | ICD-10-CM | POA: Insufficient documentation

## 2013-10-20 DIAGNOSIS — M79609 Pain in unspecified limb: Secondary | ICD-10-CM | POA: Insufficient documentation

## 2013-10-20 DIAGNOSIS — M519 Unspecified thoracic, thoracolumbar and lumbosacral intervertebral disc disorder: Secondary | ICD-10-CM

## 2013-10-20 DIAGNOSIS — Z79899 Other long term (current) drug therapy: Secondary | ICD-10-CM

## 2013-10-20 DIAGNOSIS — G8929 Other chronic pain: Secondary | ICD-10-CM

## 2013-10-20 DIAGNOSIS — M549 Dorsalgia, unspecified: Secondary | ICD-10-CM | POA: Insufficient documentation

## 2013-10-20 MED ORDER — HYDROCODONE-ACETAMINOPHEN 10-325 MG PO TABS: 1.0000 | ORAL_TABLET | Freq: Four times a day (QID) | ORAL | Status: DC | PRN
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
## 2013-11-07 ENCOUNTER — Telehealth: Payer: Self-pay

## 2013-11-07 NOTE — Telephone Encounter (Signed)
Attempted to reach Verneeshia but her hours are 8-4pm. Unable to speak with her.

## 2013-11-07 NOTE — Telephone Encounter (Signed)
Verneeshia with [facility] called regarding patients last appointment and paperwork.

## 2013-11-09 NOTE — Telephone Encounter (Signed)
Matthew Sloan has called back stating that the papers faxed were incomplete and needs to verify forms were received (12-14 pages of an opioid packet)

## 2013-11-10 NOTE — Telephone Encounter (Signed)
Attempted to contact [place]Vernisha @ [facility]. Left a voicemail to return call to clinic.

## 2013-11-10 NOTE — Telephone Encounter (Signed)
Matthew Sloan called again regarding patients paperwork.

## 2013-11-13 NOTE — Telephone Encounter (Signed)
Madelon LipsVernisha [facility] with paperwork.  She can be reached before 4 pm.

## 2013-11-20 ENCOUNTER — Encounter: Payer: Worker's Compensation | Attending: Physical Medicine and Rehabilitation | Admitting: Registered Nurse

## 2013-11-20 ENCOUNTER — Encounter: Payer: Self-pay | Admitting: Registered Nurse

## 2013-11-20 VITALS — BP 128/81 | HR 95 | Resp 14 | Ht 69.0 in | Wt 173.0 lb

## 2013-11-20 DIAGNOSIS — Z5181 Encounter for therapeutic drug level monitoring: Secondary | ICD-10-CM | POA: Insufficient documentation

## 2013-11-20 DIAGNOSIS — I10 Essential (primary) hypertension: Secondary | ICD-10-CM | POA: Insufficient documentation

## 2013-11-20 DIAGNOSIS — M25559 Pain in unspecified hip: Secondary | ICD-10-CM | POA: Insufficient documentation

## 2013-11-20 DIAGNOSIS — F17200 Nicotine dependence, unspecified, uncomplicated: Secondary | ICD-10-CM | POA: Insufficient documentation

## 2013-11-20 DIAGNOSIS — G8921 Chronic pain due to trauma: Secondary | ICD-10-CM

## 2013-11-20 DIAGNOSIS — Z79899 Other long term (current) drug therapy: Secondary | ICD-10-CM | POA: Insufficient documentation

## 2013-11-20 DIAGNOSIS — M549 Dorsalgia, unspecified: Secondary | ICD-10-CM | POA: Insufficient documentation

## 2013-11-20 DIAGNOSIS — G8929 Other chronic pain: Secondary | ICD-10-CM | POA: Insufficient documentation

## 2013-11-20 DIAGNOSIS — G548 Other nerve root and plexus disorders: Secondary | ICD-10-CM

## 2013-11-20 DIAGNOSIS — M519 Unspecified thoracic, thoracolumbar and lumbosacral intervertebral disc disorder: Secondary | ICD-10-CM | POA: Insufficient documentation

## 2013-11-20 MED ORDER — DICLOFENAC EPOLAMINE 1.3 % TD PTCH: 1.0000 | MEDICATED_PATCH | Freq: Two times a day (BID) | TRANSDERMAL | Status: DC

## 2013-11-20 MED ORDER — HYDROCODONE-ACETAMINOPHEN 10-325 MG PO TABS: 1.0000 | ORAL_TABLET | Freq: Four times a day (QID) | ORAL | Status: DC | PRN

## 2013-11-20 NOTE — Progress Notes (Signed)
Subjective:    Patient ID: Matthew Sloan, male    DOB: [birth year], 53 y.o.   MRN: [id number]  HPI: Matthew Sloan is a 53 year old male who returns for follow up for chronic pain and medication refill. He says his pain is in his lower back mainly right side and right intercostal muscles, right hip and bilateral lower extremities. He rates his pain 9. His current exercise regime is walking and stretching exercises with bands to upper and lower extremities.  He has lost significant amout of weight in the last few months. He has lost 10 lbs since the last visit. He is seeing his PMD and being worked up for weight loss. Has had blood work done and says he was told everything looked good. He is scheduled for colonoscopy later in the month.   Pain Inventory Average Pain 8 Pain Right Now 9 My pain is sharp, burning, stabbing, tingling and aching  In the last 24 hours, has pain interfered with the following? General activity 10 Relation with others 10 Enjoyment of life 10 What TIME of day is your pain at its worst? constant Sleep (in general) Poor  Pain is worse with: walking, bending, sitting, inactivity and standing Pain improves with: rest, medication and TENS Relief from Meds: 2  Mobility use a cane  Function Do you have any goals in this area?  yes  Neuro/Psych No problems in this area  Prior Studies Any changes since last visit?  no  Physicians involved in your care Any changes since last visit?  no   Family History  Problem Relation Age of Onset  . Cancer Mother 2245    breast cancer  . Heart disease Father 1972    CHF   History   Social History  . Marital Status: Single    Spouse Name: N/A    Number of Children: N/A  . Years of Education: N/A   Social History Main Topics  . Smoking status: Current Every Day Smoker -- 1.00 packs/day for 30 years    Types: Cigarettes  . Smokeless tobacco: Never Used     Comment: working on it-down to 0.5 ppd  . Alcohol  Use: No  . Drug Use: No  . Sexual Activity: None   Other Topics Concern  . None   Social History Narrative  . None   Past Surgical History  Procedure Laterality Date  . Shoulder surgery  2012    lt  . Multiple tooth extractions    . Ulnar tunnel release  12/15/2011    Procedure: CUBITAL TUNNEL RELEASE;  Surgeon: Tami RibasKevin R Kuzma, MD;  Location: [facility];  Service: Orthopedics;  Laterality: Left;  left cubital tunnel release  . Carpal tunnel release  12/15/2011    Procedure: CARPAL TUNNEL RELEASE;  Surgeon: Tami RibasKevin R Kuzma, MD;  Location: [facility];  Service: Orthopedics;  Laterality: Left;  . Bilateral tubes placed in ears  04/12/2012   Past Medical History  Diagnosis Date  . Lumbosacral neuritis   . Rotator cuff (capsule) sprain   . Disturbance of skin sensation   . Chronic pain due to trauma   . Intercostal neuralgia   . Allergy   . Snores    BP 128/81  Pulse 95  Resp 14  Ht 5\' 9"  (1.753 m)  Wt 173 lb (78.472 kg)  BMI 25.54 kg/m2  SpO2 96%  Opioid Risk Score:   Fall Risk Score: Moderate Fall Risk (6-13  points) (patient edcuated handout declined)   Review of Systems  Musculoskeletal: Positive for back pain and neck pain.  All other systems reviewed and are negative.      Objective:   Physical Exam  Nursing note and vitals reviewed. Constitutional: He is oriented to person, place, and time. He appears well-developed and well-nourished.   weight Loss/ Thin Built  HENT:  Head: Normocephalic and atraumatic.  Neck: Normal range of motion. Neck supple.  Cardiovascular: Normal rate and regular rhythm.   Pulmonary/Chest: Effort normal and breath sounds normal.  Musculoskeletal:  Normal Muscle Bulk: Muscle Testing Reveals: Upper Extremities: Right Upper extremity Full ROM strength 4/5. Left Upper extremity: Decreased ROM 45 degrees. Spine Flexion 35 degrees Lumbar paraspinal Tenderness: L-4-L-5 Right greater Trochanteric tenderness  noted. Arises from chair with some difficulty, takes a little time to stand erect in position. Antalgic gait/ Weight shift to left/ painful right hip. Walks with a cane, Narrow based gait.  Neurological: He is alert and oriented to person, place, and time.  Skin: Skin is warm and dry.  Psychiatric: He has a normal mood and affect.          Assessment & Plan:  1. 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 Refilled: Hydrocodone 10/325 mg #120 pills--use every 6 hours as Needed.  2. Intercostal neuralgia: Chest X-ray pending PMD ordered. Continue Robaxin and Neurontin  3. Insomnia: Continue trazodone.   15 minutes of face to face patient care time was spent during this visit. All questions were encouraged and answered.  F/U in 1 month

## 2013-11-24 ENCOUNTER — Telehealth: Payer: Self-pay

## 2013-11-24 NOTE — Telephone Encounter (Signed)
Attempted to contact Matthew Sloan @ [facility]. Left her a voicemail asking her to refax the paperwork that she is referring to.

## 2013-11-24 NOTE — Telephone Encounter (Signed)
Matthew Sloan @ [facility] called to check the status of paperwork that was faxed to the clinic regarding patient's OV on 5/11.

## 2013-11-28 ENCOUNTER — Telehealth: Payer: Self-pay | Admitting: *Deleted

## 2013-11-28 NOTE — Telephone Encounter (Signed)
Opioid Watch form for Flector Patch 1.3% ( requesting us to order generic vs brand name) was faxed back to Elmo PuttVernisha Wells RN.  There is no generic form of Flector Patches and therefore we have no alternative and he cannot take oral diclofenac.

## 2013-12-13 NOTE — Progress Notes (Signed)
   Subjective:    Patient ID: Matthew Sloan, male    DOB: 20-Aug-1960, 53 y.o.   MRN: [id number]  HPI Review of Systems     Objective:   Physical Exam        Assessment & Plan:
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
## 2013-12-18 ENCOUNTER — Encounter: Payer: Worker's Compensation | Attending: Registered Nurse | Admitting: Registered Nurse

## 2013-12-18 DIAGNOSIS — IMO0002 Reserved for concepts with insufficient information to code with codable children: Secondary | ICD-10-CM | POA: Insufficient documentation

## 2013-12-18 DIAGNOSIS — G8929 Other chronic pain: Secondary | ICD-10-CM | POA: Insufficient documentation

## 2013-12-18 DIAGNOSIS — F17200 Nicotine dependence, unspecified, uncomplicated: Secondary | ICD-10-CM | POA: Insufficient documentation

## 2013-12-18 DIAGNOSIS — M79609 Pain in unspecified limb: Secondary | ICD-10-CM | POA: Insufficient documentation

## 2013-12-18 DIAGNOSIS — M25519 Pain in unspecified shoulder: Secondary | ICD-10-CM | POA: Insufficient documentation

## 2013-12-18 DIAGNOSIS — G4700 Insomnia, unspecified: Secondary | ICD-10-CM | POA: Insufficient documentation

## 2013-12-18 DIAGNOSIS — I10 Essential (primary) hypertension: Secondary | ICD-10-CM | POA: Insufficient documentation

## 2013-12-18 DIAGNOSIS — M549 Dorsalgia, unspecified: Secondary | ICD-10-CM | POA: Insufficient documentation

## 2013-12-29 ENCOUNTER — Encounter (HOSPITAL_BASED_OUTPATIENT_CLINIC_OR_DEPARTMENT_OTHER): Payer: Worker's Compensation | Admitting: Registered Nurse

## 2013-12-29 ENCOUNTER — Encounter: Payer: Self-pay | Admitting: Registered Nurse

## 2013-12-29 VITALS — BP 132/70 | HR 78 | Resp 14 | Ht 69.0 in | Wt 169.0 lb

## 2013-12-29 DIAGNOSIS — Z79899 Other long term (current) drug therapy: Secondary | ICD-10-CM

## 2013-12-29 DIAGNOSIS — G8929 Other chronic pain: Secondary | ICD-10-CM

## 2013-12-29 DIAGNOSIS — M519 Unspecified thoracic, thoracolumbar and lumbosacral intervertebral disc disorder: Secondary | ICD-10-CM

## 2013-12-29 DIAGNOSIS — Z5181 Encounter for therapeutic drug level monitoring: Secondary | ICD-10-CM

## 2013-12-29 DIAGNOSIS — M25559 Pain in unspecified hip: Secondary | ICD-10-CM

## 2013-12-29 DIAGNOSIS — M25551 Pain in right hip: Secondary | ICD-10-CM

## 2013-12-29 DIAGNOSIS — G548 Other nerve root and plexus disorders: Secondary | ICD-10-CM

## 2013-12-29 DIAGNOSIS — G8921 Chronic pain due to trauma: Secondary | ICD-10-CM

## 2013-12-29 MED ORDER — HYDROCODONE-ACETAMINOPHEN 10-325 MG PO TABS: 1.0000 | ORAL_TABLET | Freq: Four times a day (QID) | ORAL | Status: DC | PRN
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
## 2014-01-08 ENCOUNTER — Other Ambulatory Visit: Payer: Self-pay

## 2014-01-08 MED ORDER — TRAZODONE HCL 50 MG PO TABS: ORAL_TABLET | ORAL | Status: DC

## 2014-01-10 ENCOUNTER — Telehealth: Payer: Self-pay

## 2014-01-10 NOTE — Telephone Encounter (Signed)
Tommi @ [facility] called to remind us that they had faxed over a opioid tool to be filled out by Dr. Wynn BankerKirsteins and the patient at his next OV which is 7/17. Attempted to contact Tommi. Left a voicemail requesting her to refax that opioid tool sheet to the clinic, and then for her to call the clinic to inform a staff member when it is faxed.

## 2014-01-15 ENCOUNTER — Other Ambulatory Visit: Payer: Self-pay

## 2014-01-15 MED ORDER — METHOCARBAMOL 500 MG PO TABS: 500.0000 mg | ORAL_TABLET | Freq: Three times a day (TID) | ORAL | Status: DC

## 2014-01-15 MED ORDER — GABAPENTIN 800 MG PO TABS: 800.0000 mg | ORAL_TABLET | Freq: Three times a day (TID) | ORAL | Status: DC

## 2014-01-15 MED ORDER — DICLOFENAC EPOLAMINE 1.3 % TD PTCH
1.0000 | MEDICATED_PATCH | Freq: Two times a day (BID) | TRANSDERMAL | Status: DC
Start: 2014-01-15 — End: 2014-03-02

## 2014-01-15 NOTE — Telephone Encounter (Signed)
Pharmacy request received to refill Robaxin, Flector, Gabapentin. [facility] [id number] [place] [phone]

## 2014-01-26 ENCOUNTER — Encounter: Payer: Worker's Compensation | Admitting: Registered Nurse

## 2014-02-01 ENCOUNTER — Encounter: Payer: Worker's Compensation | Attending: Physical Medicine and Rehabilitation | Admitting: Registered Nurse

## 2014-02-01 ENCOUNTER — Encounter: Payer: Self-pay | Admitting: Registered Nurse

## 2014-02-01 VITALS — BP 126/70 | HR 75 | Resp 14 | Ht 69.0 in | Wt 166.0 lb

## 2014-02-01 DIAGNOSIS — M549 Dorsalgia, unspecified: Secondary | ICD-10-CM | POA: Insufficient documentation

## 2014-02-01 DIAGNOSIS — G548 Other nerve root and plexus disorders: Secondary | ICD-10-CM

## 2014-02-01 DIAGNOSIS — G8929 Other chronic pain: Secondary | ICD-10-CM | POA: Insufficient documentation

## 2014-02-01 DIAGNOSIS — G8921 Chronic pain due to trauma: Secondary | ICD-10-CM | POA: Insufficient documentation

## 2014-02-01 DIAGNOSIS — M519 Unspecified thoracic, thoracolumbar and lumbosacral intervertebral disc disorder: Secondary | ICD-10-CM | POA: Insufficient documentation

## 2014-02-01 DIAGNOSIS — Z5181 Encounter for therapeutic drug level monitoring: Secondary | ICD-10-CM | POA: Insufficient documentation

## 2014-02-01 DIAGNOSIS — I10 Essential (primary) hypertension: Secondary | ICD-10-CM | POA: Insufficient documentation

## 2014-02-01 DIAGNOSIS — Z79899 Other long term (current) drug therapy: Secondary | ICD-10-CM | POA: Insufficient documentation

## 2014-02-01 DIAGNOSIS — F17200 Nicotine dependence, unspecified, uncomplicated: Secondary | ICD-10-CM | POA: Insufficient documentation

## 2014-02-01 DIAGNOSIS — M25559 Pain in unspecified hip: Secondary | ICD-10-CM | POA: Insufficient documentation

## 2014-02-01 MED ORDER — HYDROCODONE-ACETAMINOPHEN 10-325 MG PO TABS: 1.0000 | ORAL_TABLET | Freq: Four times a day (QID) | ORAL | Status: DC | PRN

## 2014-02-01 NOTE — Progress Notes (Signed)
Subjective:    Patient ID: Matthew Sloan, male    DOB: 1960/09/01, 53 y.o.   MRN: [id number]  HPI: [name] is a 53 year old male who returns for follow up for chronic pain and medication refill.he says his pain is located all over his body. He says " I ached everywhere" states its the weather. He rates his pain 9. His current exercise regime is walking and performing stretching exercises using the bands and balls.  He has been following up with his cardiologist since his stents have been placed. Also had his colonoscopy he says it was good. He's still is awaiting the results of one test he  states.  Pain Inventory Average Pain 8 Pain Right Now 9 My pain is sharp, burning, stabbing and tingling  In the last 24 hours, has pain interfered with the following? General activity 10 Relation with others 9 Enjoyment of life 10 What TIME of day is your pain at its worst? constant Sleep (in general) Poor  Pain is worse with: walking, bending, sitting, inactivity and standing Pain improves with: medication Relief from Meds: 3  Mobility use a cane Do you have any goals in this area?  yes  Function disabled: date disabled . Do you have any goals in this area?  yes  Neuro/Psych weakness trouble walking spasms  Prior Studies Any changes since last visit?  no  Physicians involved in your care Any changes since last visit?  no   Family History  Problem Relation Age of Onset  . Cancer Mother 8445    breast cancer  . Heart disease Father 3572    CHF   History   Social History  . Marital Status: Single    Spouse Name: N/A    Number of Children: N/A  . Years of Education: N/A   Social History Main Topics  . Smoking status: Current Every Day Smoker -- 1.00 packs/day for 30 years    Types: Cigarettes  . Smokeless tobacco: Never Used     Comment: working on it-down to 0.5 ppd  . Alcohol Use: No  . Drug Use: No  . Sexual Activity: None   Other Topics Concern  . None    Social History Narrative  . None   Past Surgical History  Procedure Laterality Date  . Shoulder surgery  2012    lt  . Multiple tooth extractions    . Ulnar tunnel release  12/15/2011    Procedure: CUBITAL TUNNEL RELEASE;  Surgeon: Tami RibasKevin R Kuzma, MD;  Location: [facility];  Service: Orthopedics;  Laterality: Left;  left cubital tunnel release  . Carpal tunnel release  12/15/2011    Procedure: CARPAL TUNNEL RELEASE;  Surgeon: Tami RibasKevin R Kuzma, MD;  Location: [facility];  Service: Orthopedics;  Laterality: Left;  . Bilateral tubes placed in ears  04/12/2012   Past Medical History  Diagnosis Date  . Lumbosacral neuritis   . Rotator cuff (capsule) sprain   . Disturbance of skin sensation   . Chronic pain due to trauma   . Intercostal neuralgia   . Allergy   . Snores   . Myocardial infarction    BP 126/70  Pulse 75  Resp 14  Ht 5\' 9"  (1.753 m)  Wt 166 lb (75.297 kg)  BMI 24.50 kg/m2  SpO2 97%  Opioid Risk Score:   Fall Risk Score: Moderate Fall Risk (6-13 points) (patient educated handout declined)   Review of Systems  Musculoskeletal: Positive for back pain and gait problem.  Neurological: Positive for weakness.  All other systems reviewed and are negative.      Objective:   Physical Exam  Nursing note and vitals reviewed. Constitutional: He is oriented to person, place, and time. He appears well-developed and well-nourished.  HENT:  Head: Normocephalic and atraumatic.  Neck: Normal range of motion. Neck supple.  Cardiovascular: Normal rate, regular rhythm and normal heart sounds.   Pulmonary/Chest: Effort normal and breath sounds normal.  Musculoskeletal:  Normal Muscle Bulk and Muscle testing Reveals: Upper Extremities: Full ROM and Muscle Strength on the Right 4/5 and Left 5/5 He says he doesn't want to do spinal flexion or extension due to pain. Lower Extremities: Full ROM and Muscle Strength 5/5 Arises from chair with ease/ Uses  Straight Cane Antalgic gait  Neurological: He is alert and oriented to person, place, and time.  Skin: Skin is warm and dry.  Psychiatric: He has a normal mood and affect.          Assessment & Plan:  1. 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  Refilled: Hydrocodone 10/325 mg #120 pills--use every 6 hours as Needed.  2. Intercostal neuralgia: Continue Robaxin and Neurontin  3. Insomnia: Continue trazodone.   20 minutes of face to face patient care time was spent during this visit. All questions were encouraged and answered.   F/U in 1 month

## 2014-02-05 ENCOUNTER — Other Ambulatory Visit: Payer: Self-pay

## 2014-02-05 MED ORDER — METHOCARBAMOL 500 MG PO TABS
500.0000 mg | ORAL_TABLET | Freq: Three times a day (TID) | ORAL | Status: DC
Start: 2014-02-05 — End: 2014-03-02

## 2014-02-15 ENCOUNTER — Telehealth: Payer: Self-pay | Admitting: *Deleted

## 2014-02-15 NOTE — Telephone Encounter (Signed)
[employer] has decided to [employer] Matthew Sloan opinion that Matthew RuizJohn needs certain medications.  Matthew Sloan needs to have specific note to [name].  I called office back and requested they fax a written request for the medical records.  I left our fax number with his assistant as he was in a deposition.

## 2014-02-20 NOTE — Telephone Encounter (Signed)
Documentation request received and records sent by Anastasio ChampionArdenia

## 2014-03-02 ENCOUNTER — Encounter: Payer: Self-pay | Admitting: Registered Nurse

## 2014-03-02 ENCOUNTER — Encounter: Payer: Worker's Compensation | Attending: Registered Nurse | Admitting: Registered Nurse

## 2014-03-02 VITALS — BP 167/72 | HR 91 | Resp 14 | Wt 170.8 lb

## 2014-03-02 DIAGNOSIS — G548 Other nerve root and plexus disorders: Secondary | ICD-10-CM | POA: Insufficient documentation

## 2014-03-02 DIAGNOSIS — M519 Unspecified thoracic, thoracolumbar and lumbosacral intervertebral disc disorder: Secondary | ICD-10-CM | POA: Diagnosis not present

## 2014-03-02 DIAGNOSIS — G8921 Chronic pain due to trauma: Secondary | ICD-10-CM

## 2014-03-02 DIAGNOSIS — Z5181 Encounter for therapeutic drug level monitoring: Secondary | ICD-10-CM | POA: Insufficient documentation

## 2014-03-02 DIAGNOSIS — Z79899 Other long term (current) drug therapy: Secondary | ICD-10-CM

## 2014-03-02 MED ORDER — GABAPENTIN 800 MG PO TABS: 800.0000 mg | ORAL_TABLET | Freq: Three times a day (TID) | ORAL | Status: DC

## 2014-03-02 MED ORDER — DICLOFENAC EPOLAMINE 1.3 % TD PTCH: 1.0000 | MEDICATED_PATCH | Freq: Two times a day (BID) | TRANSDERMAL | Status: DC

## 2014-03-02 MED ORDER — TRAZODONE HCL 50 MG PO TABS: ORAL_TABLET | ORAL | Status: DC

## 2014-03-02 MED ORDER — HYDROCODONE-ACETAMINOPHEN 10-325 MG PO TABS: 1.0000 | ORAL_TABLET | Freq: Four times a day (QID) | ORAL | Status: DC | PRN

## 2014-03-02 MED ORDER — METHOCARBAMOL 500 MG PO TABS: 500.0000 mg | ORAL_TABLET | Freq: Three times a day (TID) | ORAL | Status: DC

## 2014-03-02 NOTE — Progress Notes (Signed)
Subjective:    Patient ID: Matthew Sloan, male    DOB: 1961/05/15, 53 y.o.   MRN: [id number]  HPI: : Matthew Sloan is a 53 year old male who returns for follow up for chronic pain and medication refill.he says his pain is located all over his body. He rates his pain 9. His current exercise regime is walking and performing stretching exercises using the bands and balls.   Pain Inventory Average Pain 8 Pain Right Now 9 My pain is sharp, burning, stabbing, tingling and aching  In the last 24 hours, has pain interfered with the following? General activity 9 Relation with others 10 Enjoyment of life 10 What TIME of day is your pain at its worst? all Sleep (in general) Poor  Pain is worse with: walking, bending, sitting, inactivity and standing Pain improves with: medication Relief from Meds: 3  Mobility use a cane Do you have any goals in this area?  yes  Function disabled: date disabled . I need assistance with the following:  meal prep, household duties and shopping  Neuro/Psych weakness numbness tingling trouble walking spasms dizziness  Prior Studies Any changes since last visit?  no  Physicians involved in your care Any changes since last visit?  no   Family History  Problem Relation Age of Onset  . Cancer Mother 61    breast cancer  . Heart disease Father 83    CHF   History   Social History  . Marital Status: Single    Spouse Name: N/A    Number of Children: N/A  . Years of Education: N/A   Social History Main Topics  . Smoking status: Current Every Day Smoker -- 1.00 packs/day for 30 years    Types: Cigarettes  . Smokeless tobacco: Never Used     Comment: working on it-down to 0.5 ppd  . Alcohol Use: No  . Drug Use: No  . Sexual Activity: None   Other Topics Concern  . None   Social History Narrative  . None   Past Surgical History  Procedure Laterality Date  . Shoulder surgery  2012    lt  . Multiple tooth extractions    .  Ulnar tunnel release  12/15/2011    Procedure: CUBITAL TUNNEL RELEASE;  Surgeon: [name], MD;  Location: [facility];  Service: Orthopedics;  Laterality: Left;  left cubital tunnel release  . Carpal tunnel release  12/15/2011    Procedure: CARPAL TUNNEL RELEASE;  Surgeon: [name], MD;  Location: [facility];  Service: Orthopedics;  Laterality: Left;  . Bilateral tubes placed in ears  04/12/2012   Past Medical History  Diagnosis Date  . Lumbosacral neuritis   . Rotator cuff (capsule) sprain   . Disturbance of skin sensation   . Chronic pain due to trauma   . Intercostal neuralgia   . Allergy   . Snores   . Myocardial infarction    BP 167/72  Pulse 91  Resp 14  Wt 170 lb 12.8 oz (77.474 kg)  SpO2 95%  Opioid Risk Score:   Fall Risk Score: Moderate Fall Risk (6-13 points) (peviously educated and given handout) Review of Systems  Musculoskeletal: Positive for gait problem.       Spasms  Neurological: Positive for dizziness, weakness and numbness.       Tingling  All other systems reviewed and are negative.      Objective:   Physical Exam  Nursing note and vitals reviewed. Constitutional: He is oriented to person, place, and time. He appears well-developed and well-nourished.  HENT:  Head: Normocephalic and atraumatic.  Neck: Normal range of motion. Neck supple.  Cardiovascular: Normal rate and regular rhythm.   Pulmonary/Chest: Effort normal and breath sounds normal.  Musculoskeletal:  Normal Muscle Bulk and Muscle Testing Reveals: Upper Extremities: Decreased ROM Right 90 Degrees and Left 45 Degrees. Muscle Strength right 3/5 and left 4/5 Thoracic and Lumbar Hypersensitivity Lower Extremities: Decreased ROM and Muscle strength 5/5 Arises from chair with slight difficulty Antalgic gait  Neurological: He is alert and oriented to person, place, and time.  Skin: Skin is warm and dry.  Psychiatric: He has a normal mood and affect.           Assessment & Plan:  1. 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  Refilled: Hydrocodone 10/325 mg #120 pills--use every 6 hours as Needed.  2. Intercostal neuralgia: Continue Robaxin and Neurontin  3. Insomnia: Continue trazodone.   20 minutes of face to face patient care time was spent during this visit. All questions were encouraged and answered.   F/U in 1 month

## 2014-04-02 ENCOUNTER — Encounter: Payer: Worker's Compensation | Attending: Registered Nurse | Admitting: Registered Nurse

## 2014-04-02 ENCOUNTER — Encounter: Payer: Self-pay | Admitting: Registered Nurse

## 2014-04-02 VITALS — BP 136/68 | HR 74 | Resp 16 | Ht 69.0 in | Wt 168.6 lb

## 2014-04-02 DIAGNOSIS — G8921 Chronic pain due to trauma: Secondary | ICD-10-CM

## 2014-04-02 DIAGNOSIS — M519 Unspecified thoracic, thoracolumbar and lumbosacral intervertebral disc disorder: Secondary | ICD-10-CM | POA: Diagnosis not present

## 2014-04-02 DIAGNOSIS — Z79899 Other long term (current) drug therapy: Secondary | ICD-10-CM | POA: Diagnosis not present

## 2014-04-02 DIAGNOSIS — Z5181 Encounter for therapeutic drug level monitoring: Secondary | ICD-10-CM

## 2014-04-02 DIAGNOSIS — G548 Other nerve root and plexus disorders: Secondary | ICD-10-CM

## 2014-04-02 MED ORDER — HYDROCODONE-ACETAMINOPHEN 10-325 MG PO TABS: 1.0000 | ORAL_TABLET | Freq: Four times a day (QID) | ORAL | Status: DC | PRN

## 2014-04-02 NOTE — Progress Notes (Signed)
Subjective:    Patient ID: Matthew Sloan, male    DOB: 1961/06/09, 53 y.o.   MRN: [id number]  HPI: Matthew Sloan is a 53 year old male who returns for follow up for chronic pain and medication refill.He is complaining of generalized pain all over his body. He rates his pain 9. His current exercise regime is walking and performing stretching exercises using the bands and balls.   Pain Inventory Average Pain 8 Pain Right Now 9 My pain is constant, sharp, burning, stabbing, tingling and aching  In the last 24 hours, has pain interfered with the following? General activity 10 Relation with others 8 Enjoyment of life 10 What TIME of day is your pain at its worst? morning, daytime, evening, night Sleep (in general) Poor  Pain is worse with: walking, bending, sitting, inactivity and standing Pain improves with: medication Relief from Meds: 2  Mobility use a cane  Function disabled: date disabled 2011  Neuro/Psych No problems in this area  Prior Studies Any changes since last visit?  no  Physicians involved in your care Any changes since last visit?  no   Family History  Problem Relation Age of Onset  . Cancer Mother 92    breast cancer  . Heart disease Father 49    CHF   History   Social History  . Marital Status: Single    Spouse Name: N/A    Number of Children: N/A  . Years of Education: N/A   Social History Main Topics  . Smoking status: Current Every Day Smoker -- 1.00 packs/day for 30 years    Types: Cigarettes  . Smokeless tobacco: Never Used     Comment: working on it-down to 0.5 ppd  . Alcohol Use: No  . Drug Use: No  . Sexual Activity: None   Other Topics Concern  . None   Social History Narrative  . None   Past Surgical History  Procedure Laterality Date  . Shoulder surgery  2012    lt  . Multiple tooth extractions    . Ulnar tunnel release  12/15/2011    Procedure: CUBITAL TUNNEL RELEASE;  Surgeon: [name], MD;  Location: [facility]  ;  Service: Orthopedics;  Laterality: Left;  left cubital tunnel release  . Carpal tunnel release  12/15/2011    Procedure: CARPAL TUNNEL RELEASE;  Surgeon: [name], MD;  Location: [facility];  Service: Orthopedics;  Laterality: Left;  . Bilateral tubes placed in ears  04/12/2012   Past Medical History  Diagnosis Date  . Lumbosacral neuritis   . Rotator cuff (capsule) sprain   . Disturbance of skin sensation   . Chronic pain due to trauma   . Intercostal neuralgia   . Allergy   . Snores   . Myocardial infarction    BP 136/68  Pulse 74  Resp 16  Ht  (1.753 m)  Wt 168 lb 9.6 oz (76.476 kg)  BMI 24.89 kg/m2  SpO2 97%  Opioid Risk Score:   Fall Risk Score: Moderate Fall Risk (6-13 points)   Review of Systems     Objective:   Physical Exam  Nursing note and vitals reviewed. Constitutional: He is oriented to person, place, and time. He appears well-developed and well-nourished.  HENT:  Head: Normocephalic and atraumatic.  Neck: Normal range of motion. Neck supple.  Cardiovascular: Normal rate and regular rhythm.   Pulmonary/Chest: Effort normal and breath sounds normal.  Musculoskeletal:  Normal Muscle Bulk and Muscle Testing Reveals: Upper Extremities: Decreased ROM on the Right 90 Degrees and Left 45 degrees  Muscle Strength 4/5 Thoracic and Lumbar Hypersensitivity Lower Extremities: Left Full ROM and Muscle Strength 5/5 Left with Decreased ROM and Muscle Strength 4/5 Arises from chair with slight Difficulty Using straight cane for support Wide Based Antalgic Gait  Neurological: He is alert and oriented to person, place, and time.  Skin: Skin is warm and dry.  Psychiatric: He has a normal mood and affect.          Assessment & Plan:  1. 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  Refilled: Hydrocodone 10/325 mg #120 pills--use every 6  hours as Needed.  2. Intercostal neuralgia: Continue Robaxin and Neurontin  3. Insomnia: Continue trazodone.   15 minutes of face to face patient care time was spent during this visit. All questions were encouraged and answered.   F/U in 1 month

## 2014-04-30 ENCOUNTER — Encounter: Payer: Self-pay | Admitting: Registered Nurse

## 2014-04-30 ENCOUNTER — Encounter: Payer: Worker's Compensation | Attending: Registered Nurse | Admitting: Registered Nurse

## 2014-04-30 VITALS — BP 154/78 | HR 74 | Resp 14 | Ht 69.0 in | Wt 169.0 lb

## 2014-04-30 DIAGNOSIS — G8921 Chronic pain due to trauma: Secondary | ICD-10-CM

## 2014-04-30 DIAGNOSIS — G588 Other specified mononeuropathies: Secondary | ICD-10-CM | POA: Diagnosis not present

## 2014-04-30 DIAGNOSIS — M4647 Discitis, unspecified, lumbosacral region: Secondary | ICD-10-CM

## 2014-04-30 DIAGNOSIS — G8929 Other chronic pain: Secondary | ICD-10-CM | POA: Insufficient documentation

## 2014-04-30 DIAGNOSIS — Z5181 Encounter for therapeutic drug level monitoring: Secondary | ICD-10-CM

## 2014-04-30 DIAGNOSIS — G4700 Insomnia, unspecified: Secondary | ICD-10-CM | POA: Diagnosis not present

## 2014-04-30 DIAGNOSIS — R52 Pain, unspecified: Secondary | ICD-10-CM | POA: Insufficient documentation

## 2014-04-30 DIAGNOSIS — M519 Unspecified thoracic, thoracolumbar and lumbosacral intervertebral disc disorder: Secondary | ICD-10-CM

## 2014-04-30 DIAGNOSIS — M549 Dorsalgia, unspecified: Secondary | ICD-10-CM | POA: Insufficient documentation

## 2014-04-30 DIAGNOSIS — Z79899 Other long term (current) drug therapy: Secondary | ICD-10-CM

## 2014-04-30 DIAGNOSIS — M25551 Pain in right hip: Secondary | ICD-10-CM

## 2014-04-30 MED ORDER — DICLOFENAC EPOLAMINE 1.3 % TD PTCH: 1.0000 | MEDICATED_PATCH | Freq: Two times a day (BID) | TRANSDERMAL | Status: DC

## 2014-04-30 MED ORDER — HYDROCODONE-ACETAMINOPHEN 10-325 MG PO TABS: 1.0000 | ORAL_TABLET | Freq: Four times a day (QID) | ORAL | Status: DC | PRN

## 2014-04-30 NOTE — Progress Notes (Signed)
Subjective:    Patient ID: Matthew Sloan, male    DOB: 11-20-1960, 53 y.o.   MRN: [id number]  HPI: Matthew Sloan is a 53 year old male who returns for follow up for chronic pain and medication refill.He is complaining of generalized pain all over his body. He rates his pain 9. His current exercise regime is walking and performing stretching exercises using the bands and balls.   Pain Inventory Average Pain 9 Pain Right Now 9 My pain is constant, sharp, burning, stabbing, tingling and aching  In the last 24 hours, has pain interfered with the following? General activity 10 Relation with others 10 Enjoyment of life 10 What TIME of day is your pain at its worst? ALL Sleep (in general) Poor  Pain is worse with: walking, bending, sitting, inactivity and standing Pain improves with: rest and medication Relief from Meds: 2  Mobility use a cane  Function disabled: date disabled .  Neuro/Psych weakness numbness tingling trouble walking spasms  Prior Studies  Any changes since your last visit?  no  Physicians involved in your care Any changes since last visit?  no   Family History  Problem Relation Age of Onset  . Cancer Mother 3645    breast cancer  . Heart disease Father [id number]    CHF   History   Social History  . Marital Status: Single    Spouse Name: N/A    Number of Children: N/A  . Years of Education: N/A   Social History Main Topics  . Smoking status: Current Every Day Smoker -- 1.00 packs/day for 30 years    Types: Cigarettes  . Smokeless tobacco: Never Used     Comment: working on it-down to 0.5 ppd  . Alcohol Use: No  . Drug Use: No  . Sexual Activity: None   Other Topics Concern  . None   Social History Narrative  . None   Past Surgical History  Procedure Laterality Date  . Shoulder surgery  2012    lt  . Multiple tooth extractions    . Ulnar tunnel release  12/15/2011    Procedure: CUBITAL TUNNEL RELEASE;  Surgeon: Tami RibasKevin R Kuzma, MD;   Location: [facility];  Service: Orthopedics;  Laterality: Left;  left cubital tunnel release  . Carpal tunnel release  12/15/2011    Procedure: CARPAL TUNNEL RELEASE;  Surgeon: Tami RibasKevin R Kuzma, MD;  Location: [facility];  Service: Orthopedics;  Laterality: Left;  . Bilateral tubes placed in ears  04/12/2012   Past Medical History  Diagnosis Date  . Lumbosacral neuritis   . Rotator cuff (capsule) sprain   . Disturbance of skin sensation   . Chronic pain due to trauma   . Intercostal neuralgia   . Allergy   . Snores   . Myocardial infarction    BP 154/78  Pulse 74  Resp 14  Ht 5\' 9"  (1.753 m)  Wt 169 lb (76.658 kg)  BMI 24.95 kg/m2  SpO2 98%  Opioid Risk Score:   Fall Risk Score: Low Fall Risk (0-5 points)   Review of Systems     Objective:   Physical Exam  Nursing note and vitals reviewed. Constitutional: He is oriented to person, place, and time. He appears well-developed and well-nourished.  HENT:  Head: Normocephalic and atraumatic.  Neck: Normal range of motion. Neck supple.  Cardiovascular: Normal rate and regular rhythm.   Pulmonary/Chest: Effort normal and breath sounds normal.  Musculoskeletal:  Normal Muscle Bulk and Muscle testing Reveals: Upper Extremities: Decreased ROM and Muscle strength 5/5. Right 100 Degrees and Left 45 Degrees Lumbar Paraspinal Tenderness: L-3- L-5 Lower Extremities: Decreased ROm and Muscle Strength 5/5 Bilateral Flexion Produces Pain into Bilateral Hips and Lower Back Wide Based Gait/ Using Straight Cane for support   Neurological: He is alert and oriented to person, place, and time.  Skin: Skin is warm and dry.  Psychiatric: He has a normal mood and affect.          Assessment & Plan:  1. 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  Refilled: Hydrocodone 10/325 mg #120 pills--use every 6 hours as Needed.  2.  Intercostal neuralgia: Continue Robaxin and Neurontin  3. Insomnia: Continue trazodone.  15 minutes of face to face patient care time was spent during this visit. All questions were encouraged and answered.  F/U in 1 month

## 2014-05-09 ENCOUNTER — Telehealth: Payer: Self-pay | Admitting: Registered Nurse

## 2014-05-09 NOTE — Telephone Encounter (Signed)
Received a fax from [employer] denying Rx, need to verify whether patient was able to get medicine.

## 2014-06-01 ENCOUNTER — Telehealth: Payer: Self-pay | Admitting: *Deleted

## 2014-06-01 ENCOUNTER — Ambulatory Visit: Payer: Self-pay | Admitting: Physical Medicine & Rehabilitation

## 2014-06-01 ENCOUNTER — Encounter: Payer: Worker's Compensation | Attending: Registered Nurse | Admitting: Registered Nurse

## 2014-06-01 ENCOUNTER — Encounter: Payer: Self-pay | Admitting: Registered Nurse

## 2014-06-01 VITALS — BP 168/82 | HR 94 | Resp 14 | Ht 69.0 in | Wt 166.4 lb

## 2014-06-01 DIAGNOSIS — Z5181 Encounter for therapeutic drug level monitoring: Secondary | ICD-10-CM

## 2014-06-01 DIAGNOSIS — I252 Old myocardial infarction: Secondary | ICD-10-CM | POA: Diagnosis not present

## 2014-06-01 DIAGNOSIS — M545 Low back pain: Secondary | ICD-10-CM | POA: Diagnosis not present

## 2014-06-01 DIAGNOSIS — G8921 Chronic pain due to trauma: Secondary | ICD-10-CM

## 2014-06-01 DIAGNOSIS — Z760 Encounter for issue of repeat prescription: Secondary | ICD-10-CM | POA: Insufficient documentation

## 2014-06-01 DIAGNOSIS — M5417 Radiculopathy, lumbosacral region: Secondary | ICD-10-CM | POA: Insufficient documentation

## 2014-06-01 DIAGNOSIS — M25552 Pain in left hip: Secondary | ICD-10-CM | POA: Diagnosis not present

## 2014-06-01 DIAGNOSIS — M4647 Discitis, unspecified, lumbosacral region: Secondary | ICD-10-CM

## 2014-06-01 DIAGNOSIS — G4700 Insomnia, unspecified: Secondary | ICD-10-CM | POA: Diagnosis not present

## 2014-06-01 DIAGNOSIS — F17210 Nicotine dependence, cigarettes, uncomplicated: Secondary | ICD-10-CM | POA: Diagnosis not present

## 2014-06-01 DIAGNOSIS — M519 Unspecified thoracic, thoracolumbar and lumbosacral intervertebral disc disorder: Secondary | ICD-10-CM

## 2014-06-01 DIAGNOSIS — M25551 Pain in right hip: Secondary | ICD-10-CM

## 2014-06-01 DIAGNOSIS — G588 Other specified mononeuropathies: Secondary | ICD-10-CM | POA: Diagnosis not present

## 2014-06-01 DIAGNOSIS — R209 Unspecified disturbances of skin sensation: Secondary | ICD-10-CM | POA: Insufficient documentation

## 2014-06-01 DIAGNOSIS — Z79891 Long term (current) use of opiate analgesic: Secondary | ICD-10-CM | POA: Diagnosis not present

## 2014-06-01 DIAGNOSIS — R52 Pain, unspecified: Secondary | ICD-10-CM | POA: Insufficient documentation

## 2014-06-01 DIAGNOSIS — Z79899 Other long term (current) drug therapy: Secondary | ICD-10-CM

## 2014-06-01 MED ORDER — HYDROCODONE-ACETAMINOPHEN 10-325 MG PO TABS: 1.0000 | ORAL_TABLET | Freq: Four times a day (QID) | ORAL | Status: DC | PRN

## 2014-06-01 MED ORDER — GABAPENTIN 800 MG PO TABS: 800.0000 mg | ORAL_TABLET | Freq: Three times a day (TID) | ORAL | Status: DC

## 2014-06-01 NOTE — Telephone Encounter (Signed)
Called lawyers office and got the case workers information - Heidi DachMaureen Hayes [phone]  Left message that we are trying to get the Gabapentin approved... That this is a continuation of therapy and to call back so we can find out what we have to do to get this completed.  Left my name and extension for Ms. Madilyn FiremanHayes to return my call

## 2014-06-01 NOTE — Progress Notes (Signed)
Subjective:    Patient ID: Matthew Sloan, male    DOB: May 15, 1961, 53 y.o.   MRN: [id number]  HPI: Matthew Sloan is a 53 year old male who returns for follow up for chronic pain and medication refill.He says his pain is in his lower back and bilateral hips and complaining of generalized pain all over his body. He rates his pain 9. His current exercise regime is walking and performing stretching exercises using the bands and balls.  He say's [employer] has denied his Gabapentin, will re-order and he will call us with the number office staff will look into this, he verbalizes understanding.  Pain Inventory Average Pain 8 Pain Right Now 9 My pain is constant, sharp, burning, stabbing, tingling and aching  In the last 24 hours, has pain interfered with the following? General activity 9 Relation with others 10 Enjoyment of life 10 What TIME of day is your pain at its worst? ALL Sleep (in general) Poor  Pain is worse with: walking, bending, sitting, standing and some activites Pain improves with: rest and medication Relief from Meds: 2  Mobility walk without assistance ability to climb steps?  no do you drive?  no  Function disabled: date disabled .  Neuro/Psych weakness numbness tingling trouble walking spasms  Prior Studies Any changes since last visit?  no  Physicians involved in your care Any changes since last visit?  no   Family History  Problem Relation Age of Onset  . Cancer Mother 7445    breast cancer  . Heart disease Father [id number]    CHF   History   Social History  . Marital Status: Single    Spouse Name: N/A    Number of Children: N/A  . Years of Education: N/A   Social History Main Topics  . Smoking status: Current Every Day Smoker -- 1.00 packs/day for 30 years    Types: Cigarettes  . Smokeless tobacco: Never Used     Comment: working on it-down to 0.5 ppd  . Alcohol Use: No  . Drug Use: No  . Sexual Activity: None   Other  Topics Concern  . None   Social History Narrative   Past Surgical History  Procedure Laterality Date  . Shoulder surgery  2012    lt  . Multiple tooth extractions    . Ulnar tunnel release  12/15/2011    Procedure: CUBITAL TUNNEL RELEASE;  Surgeon: Tami RibasKevin R Kuzma, MD;  Location: [facility];  Service: Orthopedics;  Laterality: Left;  left cubital tunnel release  . Carpal tunnel release  12/15/2011    Procedure: CARPAL TUNNEL RELEASE;  Surgeon: Tami RibasKevin R Kuzma, MD;  Location: [facility];  Service: Orthopedics;  Laterality: Left;  . Bilateral tubes placed in ears  04/12/2012   Past Medical History  Diagnosis Date  . Lumbosacral neuritis   . Rotator cuff (capsule) sprain   . Disturbance of skin sensation   . Chronic pain due to trauma   . Intercostal neuralgia   . Allergy   . Snores   . Myocardial infarction    Pulse 94  Resp 14  Ht 5\' 9"  (1.753 m)  Wt 166 lb 6.4 oz (75.479 kg)  BMI 24.56 kg/m2  SpO2 94%  Opioid Risk Score:   Fall Risk Score: Low Fall Risk (0-5 points)  Review of Systems  HENT: Negative.   Eyes: Negative.   Respiratory: Negative.   Cardiovascular: Negative.   Gastrointestinal: Negative.  Endocrine: Negative.   Genitourinary: Negative.   Musculoskeletal: Positive for back pain.  Skin: Negative.   Allergic/Immunologic: Negative.   Neurological: Positive for weakness and numbness.       Trouble walking, tingling, spasms  Hematological: Negative.   Psychiatric/Behavioral: Negative.        Objective:   Physical Exam  Constitutional: He is oriented to person, place, and time. He appears well-developed and well-nourished.  HENT:  Head: Normocephalic and atraumatic.  Neck: Normal range of motion. Neck supple.  Cardiovascular: Normal rate and regular rhythm.   Pulmonary/Chest: Effort normal and breath sounds normal.  Musculoskeletal:  Normal Muscle Bulk and Muscle testing Reveals: Upper Extremities: Decreased ROM on the right  90 degrees and left 45 degrees and Muscle strength 4/5 Thoracic and Lumbar Hypersensitivity Lower Extremities: Decreased ROm and Muscle Strength 4/5 Bilateral Lower Extremities Flexion Produces pain into Bilateral Hips Arises from chair with ease Wide Based/ Antalgic Gait  Neurological: He is alert and oriented to person, place, and time.  Skin: Skin is warm and dry.  Psychiatric: He has a normal mood and affect.  Nursing note and vitals reviewed.         Assessment & Plan:  1. 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  Refilled: Hydrocodone 10/325 mg #120 pills--use every 6 hours as Needed.  2. Intercostal neuralgia: Continue Robaxin and Neurontin  3. Insomnia: Continue trazodone.  15 minutes of face to face patient care time was spent during this visit. All questions were encouraged and answered.  F/U in 1 month

## 2014-06-04 ENCOUNTER — Telehealth: Payer: Self-pay | Admitting: *Deleted

## 2014-06-04 NOTE — Telephone Encounter (Signed)
Spoke with attorney's office [phone] and they are sending us a letter to request for balance billing.  Patient is scheduled for a hearing on 07/17/2014 for his WC case.  He will probably go to collections prior to this hearing.  According to case nurse this case was closed back in June 2015.

## 2014-07-03 ENCOUNTER — Other Ambulatory Visit: Payer: Self-pay | Admitting: Physical Medicine & Rehabilitation

## 2014-07-03 ENCOUNTER — Encounter: Payer: Self-pay | Admitting: Registered Nurse

## 2014-07-03 ENCOUNTER — Encounter: Payer: Worker's Compensation | Attending: Registered Nurse | Admitting: Registered Nurse

## 2014-07-03 VITALS — BP 143/71 | HR 66 | Resp 14

## 2014-07-03 DIAGNOSIS — M25552 Pain in left hip: Secondary | ICD-10-CM | POA: Insufficient documentation

## 2014-07-03 DIAGNOSIS — G588 Other specified mononeuropathies: Secondary | ICD-10-CM | POA: Diagnosis not present

## 2014-07-03 DIAGNOSIS — F17210 Nicotine dependence, cigarettes, uncomplicated: Secondary | ICD-10-CM | POA: Insufficient documentation

## 2014-07-03 DIAGNOSIS — R200 Anesthesia of skin: Secondary | ICD-10-CM | POA: Insufficient documentation

## 2014-07-03 DIAGNOSIS — Z79891 Long term (current) use of opiate analgesic: Secondary | ICD-10-CM | POA: Insufficient documentation

## 2014-07-03 DIAGNOSIS — M5417 Radiculopathy, lumbosacral region: Secondary | ICD-10-CM | POA: Insufficient documentation

## 2014-07-03 DIAGNOSIS — Z79899 Other long term (current) drug therapy: Secondary | ICD-10-CM | POA: Diagnosis not present

## 2014-07-03 DIAGNOSIS — Z5181 Encounter for therapeutic drug level monitoring: Secondary | ICD-10-CM

## 2014-07-03 DIAGNOSIS — M4647 Discitis, unspecified, lumbosacral region: Secondary | ICD-10-CM

## 2014-07-03 DIAGNOSIS — G8921 Chronic pain due to trauma: Secondary | ICD-10-CM | POA: Insufficient documentation

## 2014-07-03 DIAGNOSIS — G4700 Insomnia, unspecified: Secondary | ICD-10-CM | POA: Insufficient documentation

## 2014-07-03 DIAGNOSIS — I252 Old myocardial infarction: Secondary | ICD-10-CM | POA: Insufficient documentation

## 2014-07-03 DIAGNOSIS — M25551 Pain in right hip: Secondary | ICD-10-CM | POA: Insufficient documentation

## 2014-07-03 DIAGNOSIS — Z760 Encounter for issue of repeat prescription: Secondary | ICD-10-CM | POA: Diagnosis present

## 2014-07-03 DIAGNOSIS — M519 Unspecified thoracic, thoracolumbar and lumbosacral intervertebral disc disorder: Secondary | ICD-10-CM

## 2014-07-03 DIAGNOSIS — M545 Low back pain: Secondary | ICD-10-CM | POA: Diagnosis not present

## 2014-07-03 MED ORDER — HYDROCODONE-ACETAMINOPHEN 10-325 MG PO TABS: 1.0000 | ORAL_TABLET | Freq: Four times a day (QID) | ORAL | Status: DC | PRN

## 2014-07-03 NOTE — Progress Notes (Signed)
Subjective:    Patient ID: Matthew Sloan, male    DOB: 06/11/1961, 53 y.o.   MRN: [id number]  HPI: [name] is a 53 year old male who returns for follow up for chronic pain and medication refill.He says his pain is in his lower back and bilateral hips right greater than left and complaining of generalized pain all over his body. He rates his pain 10. His current exercise regime is walking and performing stretching exercises using the bands and balls twice a day.  He say's [employer] compensation has denied his Gabapentin, his lawyer has a meeting with workman's compensation 07/18/2014.  Pain Inventory Average Pain 9 Pain Right Now 10 My pain is sharp, burning, stabbing, tingling and aching  In the last 24 hours, has pain interfered with the following? General activity 10 Relation with others 10 Enjoyment of life 10 What TIME of day is your pain at its worst? all Sleep (in general) Poor  Pain is worse with: walking, bending, sitting, inactivity and standing Pain improves with: medication Relief from Meds: 2  Mobility walk with assistance use a cane how many minutes can you walk? 5 ability to climb steps?  no do you drive?  yes Do you have any goals in this area?  yes  Function disabled: date disabled . I need assistance with the following:  meal prep, household duties and shopping Do you have any goals in this area?  yes  Neuro/Psych weakness numbness tingling trouble walking spasms dizziness  Prior Studies Any changes since last visit?  no  Physicians involved in your care Any changes since last visit?  no   Family History  Problem Relation Age of Onset  . Cancer Mother 1845    breast cancer  . Heart disease Father 4572    CHF   History   Social History  . Marital Status: Single    Spouse Name: N/A    Number of Children: N/A  . Years of Education: N/A   Social History Main Topics  . Smoking status: Current Every Day Smoker -- 1.00 packs/day for  30 years    Types: Cigarettes  . Smokeless tobacco: Never Used     Comment: working on it-down to 0.5 ppd  . Alcohol Use: No  . Drug Use: No  . Sexual Activity: None   Other Topics Concern  . None   Social History Narrative   Past Surgical History  Procedure Laterality Date  . Shoulder surgery  2012    lt  . Multiple tooth extractions    . Ulnar tunnel release  12/15/2011    Procedure: CUBITAL TUNNEL RELEASE;  Surgeon: Tami RibasKevin R Kuzma, MD;  Location: [facility];  Service: Orthopedics;  Laterality: Left;  left cubital tunnel release  . Carpal tunnel release  12/15/2011    Procedure: CARPAL TUNNEL RELEASE;  Surgeon: Tami RibasKevin R Kuzma, MD;  Location: [facility];  Service: Orthopedics;  Laterality: Left;  . Bilateral tubes placed in ears  04/12/2012   Past Medical History  Diagnosis Date  . Lumbosacral neuritis   . Rotator cuff (capsule) sprain   . Disturbance of skin sensation   . Chronic pain due to trauma   . Intercostal neuralgia   . Allergy   . Snores   . Myocardial infarction    BP 143/71 mmHg  Pulse 66  Resp 14  SpO2 97%  Opioid Risk Score:   Fall Risk Score: Moderate Fall Risk (6-13 points) (  pt has rec'd pamphlet during previous visit) Review of Systems  Neurological: Positive for dizziness, weakness and numbness.       Tingling Spasms    All other systems reviewed and are negative.      Objective:   Physical Exam  Constitutional: He is oriented to person, place, and time. He appears well-developed and well-nourished.  HENT:  Head: Normocephalic and atraumatic.  Neck: Normal range of motion. Neck supple.  Cardiovascular: Normal rate and regular rhythm.   Pulmonary/Chest: Effort normal and breath sounds normal.  Musculoskeletal:  Normal Muscle Bulk and Muscle testing Reveals: Upper extremities: Right Decreased ROM 100 Degrees and Left 45 Degrees and Muscle Strength 4/5 Lumbar Paraspinal Tenderness: L-3- L-5 Lower Extremities:  Decreased ROm Right Leg Flexion Produces pain into Right Hip and Lumbar Left Leg Flexion Produces pain into Left Hip Arises from chair with ease Wide Based Gait  Neurological: He is alert and oriented to person, place, and time.  Skin: Skin is warm and dry.  Psychiatric: He has a normal mood and affect.  Nursing note and vitals reviewed.         Assessment & Plan:  1. 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  Refilled: Hydrocodone 10/325 mg #120 pills--use every 6 hours as Needed.  2. Intercostal neuralgia: Continue Robaxin and Neurontin when [employer] medication 3. Insomnia: Continue trazodone.   15 minutes of face to face patient care time was spent during this visit. All questions were encouraged and answered.  F/U in 1 month

## 2014-07-04 LAB — PMP ALCOHOL METABOLITE (ETG): Ethyl Glucuronide (EtG): NEGATIVE ng/mL

## 2014-07-09 LAB — OPIATES/OPIOIDS (LC/MS-MS)
CODEINE URINE: NEGATIVE ng/mL (ref <50)
Hydrocodone: 6177 ng/mL (ref <50)
Hydromorphone: 949 ng/mL (ref <50)
Morphine Urine: NEGATIVE ng/mL (ref <50)
NORHYDROCODONE, UR: 4920 ng/mL (ref <50)
NOROXYCODONE, UR: NEGATIVE ng/mL (ref <50)
OXYMORPHONE, URINE: NEGATIVE ng/mL (ref <50)
Oxycodone, ur: NEGATIVE ng/mL (ref <50)

## 2014-07-11 LAB — PRESCRIPTION MONITORING PROFILE (SOLSTAS)
Amphetamine/Meth: NEGATIVE ng/mL
Barbiturate Screen, Urine: NEGATIVE ng/mL
Benzodiazepine Screen, Urine: NEGATIVE ng/mL
Buprenorphine, Urine: NEGATIVE ng/mL
CARISOPRODOL, URINE: NEGATIVE ng/mL
CREATININE, URINE: 161.22 mg/dL (ref >20.0)
Cannabinoid Scrn, Ur: NEGATIVE ng/mL
Cocaine Metabolites: NEGATIVE ng/mL
Fentanyl, Ur: NEGATIVE ng/mL
MDMA URINE: NEGATIVE ng/mL
MEPERIDINE UR: NEGATIVE ng/mL
METHADONE SCREEN, URINE: NEGATIVE ng/mL
Nitrites, Initial: NEGATIVE ug/mL
Oxycodone Screen, Ur: NEGATIVE ng/mL
PH URINE, INITIAL: 5.3 pH (ref 4.5–8.9)
Propoxyphene: NEGATIVE ng/mL
TRAMADOL UR: NEGATIVE ng/mL
Tapentadol, urine: NEGATIVE ng/mL
Zolpidem, Urine: NEGATIVE ng/mL

## 2014-07-18 NOTE — Progress Notes (Signed)
Urine drug screen for this encounter is consistent for prescribed medications.   

## 2014-08-01 ENCOUNTER — Encounter: Payer: Self-pay | Admitting: Registered Nurse

## 2014-08-01 ENCOUNTER — Encounter: Payer: Worker's Compensation | Attending: Registered Nurse | Admitting: Registered Nurse

## 2014-08-01 VITALS — BP 140/78 | HR 64 | Resp 14

## 2014-08-01 DIAGNOSIS — M25552 Pain in left hip: Secondary | ICD-10-CM | POA: Diagnosis not present

## 2014-08-01 DIAGNOSIS — Z760 Encounter for issue of repeat prescription: Secondary | ICD-10-CM | POA: Diagnosis not present

## 2014-08-01 DIAGNOSIS — M5417 Radiculopathy, lumbosacral region: Secondary | ICD-10-CM | POA: Diagnosis not present

## 2014-08-01 DIAGNOSIS — Z5181 Encounter for therapeutic drug level monitoring: Secondary | ICD-10-CM

## 2014-08-01 DIAGNOSIS — F17210 Nicotine dependence, cigarettes, uncomplicated: Secondary | ICD-10-CM | POA: Insufficient documentation

## 2014-08-01 DIAGNOSIS — M519 Unspecified thoracic, thoracolumbar and lumbosacral intervertebral disc disorder: Secondary | ICD-10-CM

## 2014-08-01 DIAGNOSIS — R209 Unspecified disturbances of skin sensation: Secondary | ICD-10-CM | POA: Diagnosis not present

## 2014-08-01 DIAGNOSIS — M25551 Pain in right hip: Secondary | ICD-10-CM | POA: Diagnosis not present

## 2014-08-01 DIAGNOSIS — G588 Other specified mononeuropathies: Secondary | ICD-10-CM | POA: Diagnosis not present

## 2014-08-01 DIAGNOSIS — I252 Old myocardial infarction: Secondary | ICD-10-CM | POA: Diagnosis not present

## 2014-08-01 DIAGNOSIS — Z79899 Other long term (current) drug therapy: Secondary | ICD-10-CM

## 2014-08-01 DIAGNOSIS — Z79891 Long term (current) use of opiate analgesic: Secondary | ICD-10-CM | POA: Diagnosis not present

## 2014-08-01 DIAGNOSIS — M4647 Discitis, unspecified, lumbosacral region: Secondary | ICD-10-CM

## 2014-08-01 DIAGNOSIS — G8921 Chronic pain due to trauma: Secondary | ICD-10-CM | POA: Diagnosis not present

## 2014-08-01 DIAGNOSIS — G8929 Other chronic pain: Secondary | ICD-10-CM

## 2014-08-01 DIAGNOSIS — G4700 Insomnia, unspecified: Secondary | ICD-10-CM | POA: Insufficient documentation

## 2014-08-01 DIAGNOSIS — R52 Pain, unspecified: Secondary | ICD-10-CM | POA: Diagnosis not present

## 2014-08-01 DIAGNOSIS — M545 Low back pain: Secondary | ICD-10-CM | POA: Insufficient documentation

## 2014-08-01 MED ORDER — HYDROCODONE-ACETAMINOPHEN 10-325 MG PO TABS: 1.0000 | ORAL_TABLET | Freq: Four times a day (QID) | ORAL | Status: DC | PRN

## 2014-08-01 NOTE — Progress Notes (Signed)
Subjective:    Patient ID: Matthew Sloan, male    DOB: Jan 07, 1961, 55 y.o.   MRN: [id number]  HPI: [name] is a 54 year old male who returns for follow up for chronic pain and medication refill.He says his pain is in his lower back and bilateral hips right greater than left and complaining of generalized pain all over his body. He rates his pain 9. His current exercise regime is walking and performing stretching exercises using the bands and balls daily..   Pain Inventory Average Pain 8 Pain Right Now 9 My pain is constant, sharp, burning, dull, stabbing, tingling and aching  In the last 24 hours, has pain interfered with the following? General activity 10 Relation with others 9 Enjoyment of life 10 What TIME of day is your pain at its worst? ALL Sleep (in general) Fair  Pain is worse with: walking, bending, sitting, standing and some activites Pain improves with: rest and medication Relief from Meds: 4  Mobility walk without assistance ability to climb steps?  yes do you drive?  yes  Function disabled: date disabled .  Neuro/Psych numbness tremor tingling trouble walking spasms  Prior Studies Any changes since last visit?  no  Physicians involved in your care Any changes since last visit?  no   Family History  Problem Relation Age of Onset  . Cancer Mother 72    breast cancer  . Heart disease Father 12    CHF   History   Social History  . Marital Status: Single    Spouse Name: N/A    Number of Children: N/A  . Years of Education: N/A   Social History Main Topics  . Smoking status: Current Every Day Smoker -- 1.00 packs/day for 30 years    Types: Cigarettes  . Smokeless tobacco: Never Used     Comment: working on it-down to 0.5 ppd  . Alcohol Use: No  . Drug Use: No  . Sexual Activity: None   Other Topics Concern  . None   Social History Narrative   Past Surgical History  Procedure Laterality Date  . Shoulder surgery  2012   lt  . Multiple tooth extractions    . Ulnar tunnel release  12/15/2011    Procedure: CUBITAL TUNNEL RELEASE;  Surgeon: [name], MD;  Location: [facility];  Service: Orthopedics;  Laterality: Left;  left cubital tunnel release  . Carpal tunnel release  12/15/2011    Procedure: CARPAL TUNNEL RELEASE;  Surgeon: [name], MD;  Location: [facility];  Service: Orthopedics;  Laterality: Left;  . Bilateral tubes placed in ears  04/12/2012   Past Medical History  Diagnosis Date  . Lumbosacral neuritis   . Rotator cuff (capsule) sprain   . Disturbance of skin sensation   . Chronic pain due to trauma   . Intercostal neuralgia   . Allergy   . Snores   . Myocardial infarction    BP 140/78 mmHg  Pulse 64  Resp 14  SpO2 98%  Opioid Risk Score:   Fall Risk Score: Low Fall Risk (0-5 points)  Review of Systems  Constitutional: Negative.   HENT: Negative.   Eyes: Negative.   Respiratory: Negative.   Cardiovascular: Negative.   Gastrointestinal: Negative.   Endocrine: Negative.   Genitourinary: Negative.   Musculoskeletal: Positive for myalgias, back pain and arthralgias.  Skin: Negative.   Allergic/Immunologic: Negative.   Neurological: Positive for tremors and numbness.  Tingling, trouble walking, spasms  Hematological: Negative.   Psychiatric/Behavioral: Negative.        Objective:   Physical Exam  Constitutional: He is oriented to person, place, and time. He appears well-developed and well-nourished.  HENT:  Head: Normocephalic and atraumatic.  Neck: Normal range of motion. Neck supple.  Cardiovascular: Normal rate and regular rhythm.   Pulmonary/Chest: Effort normal and breath sounds normal.  Musculoskeletal:  Normal Muscle Bulk and Muscle Testing Reveals: Upper Extremities: Right:Decreased ROM 90 Degrees and Muscle Strength 4/5 Left: Decreased ROM 45 Degrees and Muscle Strength 5/5 Thoracic and Lumbar Hypersensitivity Right Greater  Trochanteric Tenderness Lower Extremities: Right: Decreased ROM and Muscle strength 4/5 Left: Decreased ROM and Muscle strength 4/5 Arises from chair with ease using straight cane for support  Neurological: He is alert and oriented to person, place, and time.  Skin: Skin is warm and dry.  Psychiatric: He has a normal mood and affect.  Nursing note and vitals reviewed.         Assessment & Plan:  1. 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  Refilled: Hydrocodone 10/325 mg #120 pills--use every 6 hours as Needed.  2. Intercostal neuralgia: Continue Robaxin and Neurontin  3. Insomnia: Continue trazodone.   15 minutes of face to face patient care time was spent during this visit. All questions were encouraged and answered.  F/U in 1 month

## 2014-08-02 ENCOUNTER — Other Ambulatory Visit: Payer: Self-pay | Admitting: *Deleted

## 2014-08-02 MED ORDER — TRAZODONE HCL 50 MG PO TABS: ORAL_TABLET | ORAL | Status: DC

## 2014-08-02 NOTE — Telephone Encounter (Signed)
recvd faxed refill request - sent in electronically

## 2014-08-31 ENCOUNTER — Encounter: Payer: Self-pay | Admitting: Physical Medicine & Rehabilitation

## 2014-08-31 ENCOUNTER — Ambulatory Visit (HOSPITAL_BASED_OUTPATIENT_CLINIC_OR_DEPARTMENT_OTHER): Payer: Worker's Compensation | Admitting: Physical Medicine & Rehabilitation

## 2014-08-31 ENCOUNTER — Encounter: Payer: Worker's Compensation | Attending: Registered Nurse

## 2014-08-31 VITALS — BP 141/80 | HR 70 | Resp 14

## 2014-08-31 DIAGNOSIS — Z79891 Long term (current) use of opiate analgesic: Secondary | ICD-10-CM | POA: Diagnosis not present

## 2014-08-31 DIAGNOSIS — G4700 Insomnia, unspecified: Secondary | ICD-10-CM | POA: Diagnosis not present

## 2014-08-31 DIAGNOSIS — G8921 Chronic pain due to trauma: Secondary | ICD-10-CM

## 2014-08-31 DIAGNOSIS — M25551 Pain in right hip: Secondary | ICD-10-CM

## 2014-08-31 DIAGNOSIS — Z760 Encounter for issue of repeat prescription: Secondary | ICD-10-CM | POA: Insufficient documentation

## 2014-08-31 DIAGNOSIS — M25552 Pain in left hip: Secondary | ICD-10-CM | POA: Insufficient documentation

## 2014-08-31 DIAGNOSIS — F17210 Nicotine dependence, cigarettes, uncomplicated: Secondary | ICD-10-CM | POA: Insufficient documentation

## 2014-08-31 DIAGNOSIS — M5417 Radiculopathy, lumbosacral region: Secondary | ICD-10-CM | POA: Diagnosis not present

## 2014-08-31 DIAGNOSIS — I252 Old myocardial infarction: Secondary | ICD-10-CM | POA: Insufficient documentation

## 2014-08-31 DIAGNOSIS — R209 Unspecified disturbances of skin sensation: Secondary | ICD-10-CM | POA: Insufficient documentation

## 2014-08-31 DIAGNOSIS — M545 Low back pain: Secondary | ICD-10-CM | POA: Diagnosis not present

## 2014-08-31 DIAGNOSIS — M5416 Radiculopathy, lumbar region: Secondary | ICD-10-CM

## 2014-08-31 DIAGNOSIS — G588 Other specified mononeuropathies: Secondary | ICD-10-CM | POA: Diagnosis not present

## 2014-08-31 DIAGNOSIS — G8929 Other chronic pain: Secondary | ICD-10-CM

## 2014-08-31 DIAGNOSIS — M4647 Discitis, unspecified, lumbosacral region: Secondary | ICD-10-CM

## 2014-08-31 DIAGNOSIS — R52 Pain, unspecified: Secondary | ICD-10-CM | POA: Diagnosis not present

## 2014-08-31 DIAGNOSIS — M519 Unspecified thoracic, thoracolumbar and lumbosacral intervertebral disc disorder: Secondary | ICD-10-CM

## 2014-08-31 MED ORDER — HYDROCODONE-ACETAMINOPHEN 10-325 MG PO TABS: 1.0000 | ORAL_TABLET | Freq: Four times a day (QID) | ORAL | Status: DC | PRN

## 2014-08-31 MED ORDER — DICLOFENAC EPOLAMINE 1.3 % TD PTCH: 1.0000 | MEDICATED_PATCH | Freq: Two times a day (BID) | TRANSDERMAL | Status: DC

## 2014-08-31 MED ORDER — GABAPENTIN 800 MG PO TABS: 800.0000 mg | ORAL_TABLET | Freq: Three times a day (TID) | ORAL | Status: DC

## 2014-08-31 MED ORDER — METHOCARBAMOL 500 MG PO TABS: 500.0000 mg | ORAL_TABLET | Freq: Three times a day (TID) | ORAL | Status: DC
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
## 2014-08-31 NOTE — Patient Instructions (Signed)
Xrays of Right hip to see if you are developing arthritis

## 2014-09-28 ENCOUNTER — Other Ambulatory Visit: Payer: Self-pay | Admitting: Physical Medicine & Rehabilitation

## 2014-09-28 ENCOUNTER — Ambulatory Visit (HOSPITAL_BASED_OUTPATIENT_CLINIC_OR_DEPARTMENT_OTHER): Payer: Worker's Compensation | Admitting: Physical Medicine & Rehabilitation

## 2014-09-28 ENCOUNTER — Encounter: Payer: Self-pay | Admitting: Physical Medicine & Rehabilitation

## 2014-09-28 ENCOUNTER — Encounter: Payer: Worker's Compensation | Attending: Registered Nurse

## 2014-09-28 VITALS — BP 136/80 | HR 68 | Resp 14

## 2014-09-28 DIAGNOSIS — G588 Other specified mononeuropathies: Secondary | ICD-10-CM | POA: Diagnosis not present

## 2014-09-28 DIAGNOSIS — F17210 Nicotine dependence, cigarettes, uncomplicated: Secondary | ICD-10-CM | POA: Insufficient documentation

## 2014-09-28 DIAGNOSIS — Z79891 Long term (current) use of opiate analgesic: Secondary | ICD-10-CM | POA: Insufficient documentation

## 2014-09-28 DIAGNOSIS — M519 Unspecified thoracic, thoracolumbar and lumbosacral intervertebral disc disorder: Secondary | ICD-10-CM

## 2014-09-28 DIAGNOSIS — Z760 Encounter for issue of repeat prescription: Secondary | ICD-10-CM | POA: Insufficient documentation

## 2014-09-28 DIAGNOSIS — M25552 Pain in left hip: Secondary | ICD-10-CM | POA: Diagnosis not present

## 2014-09-28 DIAGNOSIS — R52 Pain, unspecified: Secondary | ICD-10-CM | POA: Insufficient documentation

## 2014-09-28 DIAGNOSIS — G8921 Chronic pain due to trauma: Secondary | ICD-10-CM | POA: Diagnosis not present

## 2014-09-28 DIAGNOSIS — M545 Low back pain: Secondary | ICD-10-CM | POA: Insufficient documentation

## 2014-09-28 DIAGNOSIS — F329 Major depressive disorder, single episode, unspecified: Secondary | ICD-10-CM

## 2014-09-28 DIAGNOSIS — M5417 Radiculopathy, lumbosacral region: Secondary | ICD-10-CM | POA: Diagnosis not present

## 2014-09-28 DIAGNOSIS — M4647 Discitis, unspecified, lumbosacral region: Secondary | ICD-10-CM

## 2014-09-28 DIAGNOSIS — Z5181 Encounter for therapeutic drug level monitoring: Secondary | ICD-10-CM

## 2014-09-28 DIAGNOSIS — G4700 Insomnia, unspecified: Secondary | ICD-10-CM | POA: Diagnosis not present

## 2014-09-28 DIAGNOSIS — I252 Old myocardial infarction: Secondary | ICD-10-CM | POA: Insufficient documentation

## 2014-09-28 DIAGNOSIS — M25551 Pain in right hip: Secondary | ICD-10-CM | POA: Diagnosis not present

## 2014-09-28 DIAGNOSIS — F341 Dysthymic disorder: Secondary | ICD-10-CM

## 2014-09-28 DIAGNOSIS — G894 Chronic pain syndrome: Secondary | ICD-10-CM

## 2014-09-28 DIAGNOSIS — G8929 Other chronic pain: Secondary | ICD-10-CM

## 2014-09-28 DIAGNOSIS — R209 Unspecified disturbances of skin sensation: Secondary | ICD-10-CM | POA: Insufficient documentation

## 2014-09-28 DIAGNOSIS — Z79899 Other long term (current) drug therapy: Secondary | ICD-10-CM

## 2014-09-28 MED ORDER — HYDROCODONE-ACETAMINOPHEN 10-325 MG PO TABS: 1.0000 | ORAL_TABLET | Freq: Four times a day (QID) | ORAL | Status: DC | PRN

## 2014-09-28 MED ORDER — TRAZODONE HCL 100 MG PO TABS: ORAL_TABLET | ORAL | Status: DC

## 2014-09-28 NOTE — Patient Instructions (Signed)
Consider change of medication hydrocodone to nucynta or oxycodone next visit
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
## 2014-09-29 LAB — PMP ALCOHOL METABOLITE (ETG): Ethyl Glucuronide (EtG): NEGATIVE ng/mL

## 2014-10-02 LAB — OPIATES/OPIOIDS (LC/MS-MS)
CODEINE URINE: NEGATIVE ng/mL (ref <50)
HYDROCODONE: 7207 ng/mL (ref <50)
Hydromorphone: 858 ng/mL (ref <50)
MORPHINE: NEGATIVE ng/mL (ref <50)
NORHYDROCODONE, UR: 3559 ng/mL (ref <50)
Noroxycodone, Ur: NEGATIVE ng/mL (ref <50)
Oxycodone, ur: NEGATIVE ng/mL (ref <50)
Oxymorphone: NEGATIVE ng/mL (ref <50)

## 2014-10-03 LAB — PRESCRIPTION MONITORING PROFILE (SOLSTAS)
Amphetamine/Meth: NEGATIVE ng/mL
BENZODIAZEPINE SCREEN, URINE: NEGATIVE ng/mL
BUPRENORPHINE, URINE: NEGATIVE ng/mL
Barbiturate Screen, Urine: NEGATIVE ng/mL
Cannabinoid Scrn, Ur: NEGATIVE ng/mL
Carisoprodol, Urine: NEGATIVE ng/mL
Cocaine Metabolites: NEGATIVE ng/mL
Creatinine, Urine: 170.9 mg/dL (ref >20.0)
FENTANYL URINE: NEGATIVE ng/mL
MDMA URINE: NEGATIVE ng/mL
Meperidine, Ur: NEGATIVE ng/mL
Methadone Screen, Urine: NEGATIVE ng/mL
Nitrites, Initial: NEGATIVE ug/mL
Oxycodone Screen, Ur: NEGATIVE ng/mL
Propoxyphene: NEGATIVE ng/mL
TAPENTADOLUR: NEGATIVE ng/mL
Tramadol Scrn, Ur: NEGATIVE ng/mL
Zolpidem, Urine: NEGATIVE ng/mL
pH, Initial: 5.3 pH (ref 4.5–8.9)

## 2014-10-18 NOTE — Progress Notes (Signed)
Urine drug screen for this encounter is consistent for prescribed medication 

## 2014-10-26 ENCOUNTER — Encounter: Payer: Self-pay | Admitting: Physical Medicine & Rehabilitation

## 2014-10-26 ENCOUNTER — Encounter: Payer: Worker's Compensation | Attending: Registered Nurse

## 2014-10-26 ENCOUNTER — Ambulatory Visit (HOSPITAL_BASED_OUTPATIENT_CLINIC_OR_DEPARTMENT_OTHER): Payer: Worker's Compensation | Admitting: Physical Medicine & Rehabilitation

## 2014-10-26 VITALS — BP 138/78 | HR 84 | Resp 16

## 2014-10-26 DIAGNOSIS — I252 Old myocardial infarction: Secondary | ICD-10-CM | POA: Diagnosis not present

## 2014-10-26 DIAGNOSIS — G4700 Insomnia, unspecified: Secondary | ICD-10-CM | POA: Diagnosis not present

## 2014-10-26 DIAGNOSIS — M25552 Pain in left hip: Secondary | ICD-10-CM | POA: Insufficient documentation

## 2014-10-26 DIAGNOSIS — F17210 Nicotine dependence, cigarettes, uncomplicated: Secondary | ICD-10-CM | POA: Diagnosis not present

## 2014-10-26 DIAGNOSIS — R52 Pain, unspecified: Secondary | ICD-10-CM | POA: Diagnosis not present

## 2014-10-26 DIAGNOSIS — M25551 Pain in right hip: Secondary | ICD-10-CM | POA: Diagnosis not present

## 2014-10-26 DIAGNOSIS — M4647 Discitis, unspecified, lumbosacral region: Secondary | ICD-10-CM

## 2014-10-26 DIAGNOSIS — M5416 Radiculopathy, lumbar region: Secondary | ICD-10-CM | POA: Diagnosis not present

## 2014-10-26 DIAGNOSIS — G588 Other specified mononeuropathies: Secondary | ICD-10-CM | POA: Insufficient documentation

## 2014-10-26 DIAGNOSIS — Z79891 Long term (current) use of opiate analgesic: Secondary | ICD-10-CM | POA: Insufficient documentation

## 2014-10-26 DIAGNOSIS — R209 Unspecified disturbances of skin sensation: Secondary | ICD-10-CM | POA: Insufficient documentation

## 2014-10-26 DIAGNOSIS — G8921 Chronic pain due to trauma: Secondary | ICD-10-CM | POA: Insufficient documentation

## 2014-10-26 DIAGNOSIS — Z760 Encounter for issue of repeat prescription: Secondary | ICD-10-CM | POA: Diagnosis present

## 2014-10-26 DIAGNOSIS — M545 Low back pain: Secondary | ICD-10-CM | POA: Diagnosis not present

## 2014-10-26 DIAGNOSIS — G8929 Other chronic pain: Secondary | ICD-10-CM

## 2014-10-26 DIAGNOSIS — M5417 Radiculopathy, lumbosacral region: Secondary | ICD-10-CM | POA: Insufficient documentation

## 2014-10-26 DIAGNOSIS — M519 Unspecified thoracic, thoracolumbar and lumbosacral intervertebral disc disorder: Secondary | ICD-10-CM

## 2014-10-26 MED ORDER — TRAZODONE HCL 100 MG PO TABS: ORAL_TABLET | ORAL | Status: DC

## 2014-10-26 MED ORDER — HYDROCODONE-ACETAMINOPHEN 10-325 MG PO TABS: 1.0000 | ORAL_TABLET | Freq: Four times a day (QID) | ORAL | Status: DC | PRN

## 2014-10-26 MED ORDER — GABAPENTIN 800 MG PO TABS: 800.0000 mg | ORAL_TABLET | Freq: Three times a day (TID) | ORAL | Status: DC

## 2014-10-26 NOTE — Patient Instructions (Signed)
Try higher dose trazodone
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
## 2014-10-29 ENCOUNTER — Other Ambulatory Visit: Payer: Self-pay | Admitting: *Deleted

## 2014-10-29 MED ORDER — DICLOFENAC EPOLAMINE 1.3 % TD PTCH: 1.0000 | MEDICATED_PATCH | Freq: Two times a day (BID) | TRANSDERMAL | Status: DC

## 2014-10-31 ENCOUNTER — Telehealth: Payer: Self-pay | Admitting: *Deleted

## 2014-10-31 NOTE — Telephone Encounter (Signed)
Recd fax - Prior authorization was approved for patient Flector patch. Authorization # [id number] from 10/29/2014 thru 07/01/2015

## 2014-11-07 ENCOUNTER — Telehealth: Payer: Self-pay | Admitting: *Deleted

## 2014-11-07 NOTE — Telephone Encounter (Signed)
Recd letter stating that patient's workers comp with cover the [employer] from 10/29/2014 thru 07/01/2015 Ref# [id number]

## 2014-11-16 ENCOUNTER — Encounter: Payer: Worker's Compensation | Admitting: Registered Nurse

## 2014-11-19 ENCOUNTER — Encounter: Payer: Worker's Compensation | Attending: Registered Nurse | Admitting: Registered Nurse

## 2014-11-19 ENCOUNTER — Encounter: Payer: Self-pay | Admitting: Registered Nurse

## 2014-11-19 VITALS — BP 135/69 | HR 75 | Resp 14

## 2014-11-19 DIAGNOSIS — G588 Other specified mononeuropathies: Secondary | ICD-10-CM | POA: Insufficient documentation

## 2014-11-19 DIAGNOSIS — M25551 Pain in right hip: Secondary | ICD-10-CM | POA: Diagnosis not present

## 2014-11-19 DIAGNOSIS — G4700 Insomnia, unspecified: Secondary | ICD-10-CM | POA: Insufficient documentation

## 2014-11-19 DIAGNOSIS — M25552 Pain in left hip: Secondary | ICD-10-CM | POA: Diagnosis not present

## 2014-11-19 DIAGNOSIS — Z79899 Other long term (current) drug therapy: Secondary | ICD-10-CM

## 2014-11-19 DIAGNOSIS — M5417 Radiculopathy, lumbosacral region: Secondary | ICD-10-CM | POA: Insufficient documentation

## 2014-11-19 DIAGNOSIS — M5416 Radiculopathy, lumbar region: Secondary | ICD-10-CM

## 2014-11-19 DIAGNOSIS — M545 Low back pain: Secondary | ICD-10-CM | POA: Diagnosis not present

## 2014-11-19 DIAGNOSIS — M519 Unspecified thoracic, thoracolumbar and lumbosacral intervertebral disc disorder: Secondary | ICD-10-CM

## 2014-11-19 DIAGNOSIS — G8921 Chronic pain due to trauma: Secondary | ICD-10-CM | POA: Diagnosis not present

## 2014-11-19 DIAGNOSIS — Z760 Encounter for issue of repeat prescription: Secondary | ICD-10-CM | POA: Diagnosis present

## 2014-11-19 DIAGNOSIS — R52 Pain, unspecified: Secondary | ICD-10-CM | POA: Diagnosis not present

## 2014-11-19 DIAGNOSIS — F17210 Nicotine dependence, cigarettes, uncomplicated: Secondary | ICD-10-CM | POA: Insufficient documentation

## 2014-11-19 DIAGNOSIS — M4647 Discitis, unspecified, lumbosacral region: Secondary | ICD-10-CM

## 2014-11-19 DIAGNOSIS — G8929 Other chronic pain: Secondary | ICD-10-CM

## 2014-11-19 DIAGNOSIS — I252 Old myocardial infarction: Secondary | ICD-10-CM | POA: Diagnosis not present

## 2014-11-19 DIAGNOSIS — Z79891 Long term (current) use of opiate analgesic: Secondary | ICD-10-CM | POA: Insufficient documentation

## 2014-11-19 DIAGNOSIS — Z5181 Encounter for therapeutic drug level monitoring: Secondary | ICD-10-CM

## 2014-11-19 DIAGNOSIS — R209 Unspecified disturbances of skin sensation: Secondary | ICD-10-CM | POA: Diagnosis not present

## 2014-11-19 MED ORDER — HYDROCODONE-ACETAMINOPHEN 10-325 MG PO TABS: 1.0000 | ORAL_TABLET | Freq: Four times a day (QID) | ORAL | Status: DC | PRN

## 2014-11-19 NOTE — Progress Notes (Signed)
Subjective:    Patient ID: Matthew Sloan, male    DOB: Mar 04, 1961, 54 y.o.   MRN: [id number]  HPI: [name] is a 54 year old male who returns for follow up for chronic pain and medication refill.He says his pain is located in his lower back and bilateral hips right greater than left and complaining of generalized pain all over his body. He rates his pain 9. His current exercise regime is walking and performing stretching exercises using the bands and balls daily.  He states he had two falls last week due to dizziness he landed on the floor. He has followed up with his PCP. Also states he had neck US and was told his thyroid was over-active he has an appointment with endocrinologist next week.  Pain Inventory Average Pain 8 Pain Right Now 9 My pain is constant, sharp, burning, stabbing, tingling and aching  In the last 24 hours, has pain interfered with the following? General activity 10 Relation with others 9 Enjoyment of life 9 What TIME of day is your pain at its worst? all Sleep (in general) Poor  Pain is worse with: walking, bending, sitting, inactivity and standing Pain improves with: medication Relief from Meds: 1  Mobility walk with assistance use a cane Do you have any goals in this area?  yes  Function disabled: date disabled . Do you have any goals in this area?  yes  Neuro/Psych weakness numbness tremor tingling trouble walking spasms  Prior Studies Any changes since last visit?  no  Physicians involved in your care Any changes since last visit?  no   Family History  Problem Relation Age of Onset  . Cancer Mother 445    breast cancer  . Heart disease Father [id number]    CHF   History   Social History  . Marital Status: Single    Spouse Name: N/A  . Number of Children: N/A  . Years of Education: N/A   Social History Main Topics  . Smoking status: Current Every Day Smoker -- 1.00 packs/day for 30 years    Types: Cigarettes  . Smokeless  tobacco: Never Used     Comment: working on it-down to 0.5 ppd  . Alcohol Use: No  . Drug Use: No  . Sexual Activity: Not on file   Other Topics Concern  . None   Social History Narrative   Past Surgical History  Procedure Laterality Date  . Shoulder surgery  2012    lt  . Multiple tooth extractions    . Ulnar tunnel release  12/15/2011    Procedure: CUBITAL TUNNEL RELEASE;  Surgeon: Tami RibasKevin R Kuzma, MD;  Location: [facility];  Service: Orthopedics;  Laterality: Left;  left cubital tunnel release  . Carpal tunnel release  12/15/2011    Procedure: CARPAL TUNNEL RELEASE;  Surgeon: Tami RibasKevin R Kuzma, MD;  Location:  SUR[facility]ERY [facility];  Service: Orthopedics;  Laterality: Left;  . Bilateral tubes placed in ears  04/12/2012   Past Medical History  Diagnosis Date  . Lumbosacral neuritis   . Rotator cuff (capsule) sprain   . Disturbance of skin sensation   . Chronic pain due to trauma   . Intercostal neuralgia   . Allergy   . Snores   . Myocardial infarction    BP 135/69 mmHg  Pulse 75  Resp 14  SpO2 96%  Opioid Risk Score:   Fall Risk Score: Moderate Fall Risk (6-13 points)`1  Depression screen  PHQ 2/9  Depression screen PHQ 2/9 09/28/2014  Decreased Interest 3  Down, Depressed, Hopeless 2  PHQ - 2 Score 5  Altered sleeping 3  Tired, decreased energy 3  Feeling bad or failure about yourself  3  Trouble concentrating 2  Moving slowly or fidgety/restless 2  Suicidal thoughts 0  PHQ-9 Score 18     Review of Systems  Musculoskeletal: Positive for gait problem.  Neurological: Positive for tremors, weakness and numbness.       Tingling Spasms   All other systems reviewed and are negative.      Objective:   Physical Exam  Constitutional: He is oriented to person, place, and time. He appears well-developed and well-nourished.  HENT:  Head: Normocephalic and atraumatic.  Neck: Normal range of motion. Neck supple.  Cardiovascular: Normal rate and  regular rhythm.   Pulmonary/Chest: Effort normal and breath sounds normal.  Musculoskeletal:  Normal Muscle Bulk and Muscle Testing Reveals: Upper Extremities: Right: Decreased ROM 90 Degrees and Muscle Strength 4/5 Left: Decreased ROM 45 Degrees and Muscle Strength 4/5. Left AC Joint Tenderness Thoracic Hypersensitivity Lumbar Paraspinal Tenderness: L-3- L-5 Lower Extremities: Decreased ROM and Muscle Strength 4/5 Right Lower extremity Flexion Produces pain into hip and lower extremity. Arises from chair slowly Antalgic gait  Neurological: He is alert and oriented to person, place, and time.  Skin: Skin is warm.  Psychiatric: He has a normal mood and affect.  Nursing note and vitals reviewed.         Assessment & Plan:  1. 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  Refilled: Hydrocodone 10/325 mg #120 pills--use every 6 hours as Needed.  2. Intercostal neuralgia: Continue Robaxin and Neurontin  3. Insomnia: Continue trazodone.   15 minutes of face to face patient care time was spent during this visit. All questions were encouraged and answered.   F/U in 1 month

## 2014-11-23 ENCOUNTER — Ambulatory Visit: Payer: Self-pay | Admitting: Registered Nurse

## 2014-12-21 ENCOUNTER — Ambulatory Visit: Payer: Self-pay | Admitting: Registered Nurse

## 2014-12-25 ENCOUNTER — Encounter: Payer: Worker's Compensation | Attending: Registered Nurse | Admitting: Registered Nurse

## 2014-12-25 ENCOUNTER — Encounter: Payer: Self-pay | Admitting: Registered Nurse

## 2014-12-25 VITALS — BP 121/68 | HR 68 | Resp 14

## 2014-12-25 DIAGNOSIS — Z79891 Long term (current) use of opiate analgesic: Secondary | ICD-10-CM | POA: Insufficient documentation

## 2014-12-25 DIAGNOSIS — G4700 Insomnia, unspecified: Secondary | ICD-10-CM | POA: Diagnosis not present

## 2014-12-25 DIAGNOSIS — M47817 Spondylosis without myelopathy or radiculopathy, lumbosacral region: Secondary | ICD-10-CM

## 2014-12-25 DIAGNOSIS — R52 Pain, unspecified: Secondary | ICD-10-CM | POA: Insufficient documentation

## 2014-12-25 DIAGNOSIS — M5416 Radiculopathy, lumbar region: Secondary | ICD-10-CM

## 2014-12-25 DIAGNOSIS — G894 Chronic pain syndrome: Secondary | ICD-10-CM

## 2014-12-25 DIAGNOSIS — G588 Other specified mononeuropathies: Secondary | ICD-10-CM | POA: Diagnosis not present

## 2014-12-25 DIAGNOSIS — M545 Low back pain: Secondary | ICD-10-CM | POA: Insufficient documentation

## 2014-12-25 DIAGNOSIS — M25552 Pain in left hip: Secondary | ICD-10-CM | POA: Insufficient documentation

## 2014-12-25 DIAGNOSIS — G8929 Other chronic pain: Secondary | ICD-10-CM

## 2014-12-25 DIAGNOSIS — R209 Unspecified disturbances of skin sensation: Secondary | ICD-10-CM | POA: Diagnosis not present

## 2014-12-25 DIAGNOSIS — F17210 Nicotine dependence, cigarettes, uncomplicated: Secondary | ICD-10-CM | POA: Insufficient documentation

## 2014-12-25 DIAGNOSIS — Z760 Encounter for issue of repeat prescription: Secondary | ICD-10-CM | POA: Insufficient documentation

## 2014-12-25 DIAGNOSIS — G8921 Chronic pain due to trauma: Secondary | ICD-10-CM | POA: Diagnosis not present

## 2014-12-25 DIAGNOSIS — I252 Old myocardial infarction: Secondary | ICD-10-CM | POA: Diagnosis not present

## 2014-12-25 DIAGNOSIS — M25551 Pain in right hip: Secondary | ICD-10-CM | POA: Diagnosis not present

## 2014-12-25 DIAGNOSIS — Z5181 Encounter for therapeutic drug level monitoring: Secondary | ICD-10-CM

## 2014-12-25 DIAGNOSIS — M5417 Radiculopathy, lumbosacral region: Secondary | ICD-10-CM | POA: Diagnosis not present

## 2014-12-25 DIAGNOSIS — Z79899 Other long term (current) drug therapy: Secondary | ICD-10-CM

## 2014-12-25 MED ORDER — HYDROCODONE-ACETAMINOPHEN 10-325 MG PO TABS
1.0000 | ORAL_TABLET | Freq: Four times a day (QID) | ORAL | Status: DC | PRN
Start: 2014-12-25 — End: 2014-12-25

## 2014-12-25 MED ORDER — HYDROCODONE-ACETAMINOPHEN 10-325 MG PO TABS: 1.0000 | ORAL_TABLET | Freq: Four times a day (QID) | ORAL | Status: DC | PRN

## 2014-12-25 NOTE — Progress Notes (Signed)
Subjective:    Patient ID: Matthew Sloan, male    DOB: Sep 20, 1960, 54 y.o.   MRN: [id number]  HPI: Matthew Sloan is a 54 year old male who returns for follow up for chronic pain and medication refill.He says his pain is located in his lower back and bilateral hips right greater than left and complaining of generalized pain all over his body. He rates his pain 9. His current exercise regime is walking and performing stretching exercises using the bands and balls daily.  Also states he was diagnosed with Hyperthyroidism, Dr. Jolyne Loa endocrinologist following. He was prescribed methimazole. Also states he has gained 4 lbs.  Pain Inventory Average Pain 8 Pain Right Now 9 My pain is constant, sharp, burning, stabbing, tingling and aching  In the last 24 hours, has pain interfered with the following? General activity 10 Relation with others 9 Enjoyment of life 9 What TIME of day is your pain at its worst? all Sleep (in general) Poor  Pain is worse with: walking, bending, sitting and standing Pain improves with: medication Relief from Meds: 1  Mobility walk with assistance use a cane how many minutes can you walk? 5 ability to climb steps?  no do you drive?  yes Do you have any goals in this area?  yes  Function disabled: date disabled . Do you have any goals in this area?  yes  Neuro/Psych weakness trouble walking spasms  Prior Studies Any changes since last visit?  no  Physicians involved in your care Any changes since last visit?  no   Family History  Problem Relation Age of Onset  . Cancer Mother 41    breast cancer  . Heart disease Father 6    CHF   History   Social History  . Marital Status: Single    Spouse Name: N/A  . Number of Children: N/A  . Years of Education: N/A   Social History Main Topics  . Smoking status: Current Every Day Smoker -- 1.00 packs/day for 30 years    Types: Cigarettes  . Smokeless tobacco: Never Used     Comment:  working on it-down to 0.5 ppd  . Alcohol Use: No  . Drug Use: No  . Sexual Activity: Not on file   Other Topics Concern  . None   Social History Narrative   Past Surgical History  Procedure Laterality Date  . Shoulder surgery  2012    lt  . Multiple tooth extractions    . Ulnar tunnel release  12/15/2011    Procedure: CUBITAL TUNNEL RELEASE;  Surgeon: [name], MD;  Location: [facility];  Service: Orthopedics;  Laterality: Left;  left cubital tunnel release  . Carpal tunnel release  12/15/2011    Procedure: CARPAL TUNNEL RELEASE;  Surgeon: [name], MD;  Location: [facility];  Service: Orthopedics;  Laterality: Left;  . Bilateral tubes placed in ears  04/12/2012   Past Medical History  Diagnosis Date  . Lumbosacral neuritis   . Rotator cuff (capsule) sprain   . Disturbance of skin sensation   . Chronic pain due to trauma   . Intercostal neuralgia   . Allergy   . Snores   . Myocardial infarction    BP 121/68 mmHg  Pulse 68  Resp 14  SpO2 96%  Opioid Risk Score:   Fall Risk Score: Moderate Fall Risk (6-13 points) (pt previously educated)`1  Depression screen PHQ 2/9  Depression screen  PHQ 2/9 09/28/2014  Decreased Interest 3  Down, Depressed, Hopeless 2  PHQ - 2 Score 5  Altered sleeping 3  Tired, decreased energy 3  Feeling bad or failure about yourself  3  Trouble concentrating 2  Moving slowly or fidgety/restless 2  Suicidal thoughts 0  PHQ-9 Score 18      Review of Systems  Musculoskeletal: Positive for gait problem.  Neurological: Positive for weakness.       Spasms   All other systems reviewed and are negative.      Objective:   Physical Exam  Constitutional: He is oriented to person, place, and time. He appears well-developed and well-nourished.  HENT:  Head: Normocephalic and atraumatic.  Neck: Normal range of motion. Neck supple.  Cardiovascular: Normal rate and regular rhythm.   Pulmonary/Chest: Effort  normal and breath sounds normal.  Musculoskeletal:  Normal Muscle Bulk and Muscle Testing Reveals: Upper Extremities: Right: Full ROM and Muscle Strength 5/5 Left: Decreased ROM 45 Degrees and Muscle Strength 5/5 Left: AC Joint Tenderness, Left Spine of Scapula Tenderness Thoracic Paraspinal Tenderness: T-8-T-12 Lumbar Paraspinal Tenderness: L-3- L-5 Lower Extremity: Right Decreased ROM and Muscle Strength 4/5 Right Lower Extremity Flexion Produces Pain into Lumbar and Lower Extremity Left" Lower Extremity: Full ROM and Muscle Strength 5/5 Arises from chair slowly Antalgic gait  Lower Extremities:    Neurological: He is alert and oriented to person, place, and time.  Skin: Skin is warm and dry.  Psychiatric: He has a normal mood and affect.  Nursing note and vitals reviewed.         Assessment & Plan:  1. 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  Refilled: Hydrocodone 10/325 mg #120 pills--use every 6 hours as needed. Second script given to accommodate scheduled appointment. 2. Intercostal neuralgia: Continue Robaxin and Neurontin  3. Insomnia: Continue trazodone.   15 minutes of face to face patient care time was spent during this visit. All questions were encouraged and answered.   F/U in 1 month

## 2014-12-31 ENCOUNTER — Other Ambulatory Visit: Payer: Self-pay | Admitting: *Deleted

## 2014-12-31 MED ORDER — METHOCARBAMOL 500 MG PO TABS: 500.0000 mg | ORAL_TABLET | Freq: Three times a day (TID) | ORAL | Status: DC

## 2015-01-30 ENCOUNTER — Other Ambulatory Visit: Payer: Self-pay | Admitting: *Deleted

## 2015-01-30 MED ORDER — TRAZODONE HCL 100 MG PO TABS: ORAL_TABLET | ORAL | Status: DC

## 2015-02-01 ENCOUNTER — Encounter: Payer: Self-pay | Admitting: Registered Nurse

## 2015-02-01 ENCOUNTER — Encounter: Payer: Worker's Compensation | Attending: Registered Nurse | Admitting: Registered Nurse

## 2015-02-01 ENCOUNTER — Other Ambulatory Visit: Payer: Self-pay | Admitting: Physical Medicine & Rehabilitation

## 2015-02-01 VITALS — BP 120/76 | HR 61 | Resp 16

## 2015-02-01 DIAGNOSIS — M545 Low back pain: Secondary | ICD-10-CM | POA: Insufficient documentation

## 2015-02-01 DIAGNOSIS — M5417 Radiculopathy, lumbosacral region: Secondary | ICD-10-CM | POA: Diagnosis not present

## 2015-02-01 DIAGNOSIS — M25552 Pain in left hip: Secondary | ICD-10-CM | POA: Diagnosis not present

## 2015-02-01 DIAGNOSIS — I252 Old myocardial infarction: Secondary | ICD-10-CM | POA: Diagnosis not present

## 2015-02-01 DIAGNOSIS — G8929 Other chronic pain: Secondary | ICD-10-CM

## 2015-02-01 DIAGNOSIS — Z760 Encounter for issue of repeat prescription: Secondary | ICD-10-CM | POA: Insufficient documentation

## 2015-02-01 DIAGNOSIS — F17210 Nicotine dependence, cigarettes, uncomplicated: Secondary | ICD-10-CM | POA: Insufficient documentation

## 2015-02-01 DIAGNOSIS — G588 Other specified mononeuropathies: Secondary | ICD-10-CM | POA: Insufficient documentation

## 2015-02-01 DIAGNOSIS — G8921 Chronic pain due to trauma: Secondary | ICD-10-CM | POA: Insufficient documentation

## 2015-02-01 DIAGNOSIS — Z79891 Long term (current) use of opiate analgesic: Secondary | ICD-10-CM | POA: Insufficient documentation

## 2015-02-01 DIAGNOSIS — M47817 Spondylosis without myelopathy or radiculopathy, lumbosacral region: Secondary | ICD-10-CM

## 2015-02-01 DIAGNOSIS — R209 Unspecified disturbances of skin sensation: Secondary | ICD-10-CM | POA: Insufficient documentation

## 2015-02-01 DIAGNOSIS — M5416 Radiculopathy, lumbar region: Secondary | ICD-10-CM

## 2015-02-01 DIAGNOSIS — G4700 Insomnia, unspecified: Secondary | ICD-10-CM | POA: Insufficient documentation

## 2015-02-01 DIAGNOSIS — Z79899 Other long term (current) drug therapy: Secondary | ICD-10-CM

## 2015-02-01 DIAGNOSIS — Z5181 Encounter for therapeutic drug level monitoring: Secondary | ICD-10-CM

## 2015-02-01 DIAGNOSIS — R52 Pain, unspecified: Secondary | ICD-10-CM | POA: Insufficient documentation

## 2015-02-01 DIAGNOSIS — M25551 Pain in right hip: Secondary | ICD-10-CM | POA: Diagnosis not present

## 2015-02-01 NOTE — Progress Notes (Signed)
Subjective:    Patient ID: Matthew Sloan, male    DOB: Jun 10, 1961, 54 y.o.   MRN: [id number]  HPI: [name] is a 54 year old male who returns for follow up for chronic pain and medication refill.He says his pain is located in his lower back and bilateral hips right greater than left and complaining of generalized pain all over his body. He rates his pain 9. His current exercise regime is walking and performing stretching exercises using the bands and balls daily.  Pain Inventory Average Pain 9 Pain Right Now 9 My pain is sharp, burning, stabbing, tingling and aching  In the last 24 hours, has pain interfered with the following? General activity 10 Relation with others 9 Enjoyment of life 9 What TIME of day is your pain at its worst? all Sleep (in general) Poor  Pain is worse with: walking, bending, sitting, inactivity and standing Pain improves with: medication Relief from Meds: 2  Mobility use a cane how many minutes can you walk? 5-10 ability to climb steps?  no do you drive?  yes  Function disabled: date disabled . Do you have any goals in this area?  yes  Neuro/Psych trouble walking  Prior Studies Any changes since last visit?  no  Physicians involved in your care Any changes since last visit?  no   Family History  Problem Relation Age of Onset  . Cancer Mother 44    breast cancer  . Heart disease Father 64    CHF   History   Social History  . Marital Status: Single    Spouse Name: N/A  . Number of Children: N/A  . Years of Education: N/A   Social History Main Topics  . Smoking status: Current Every Day Smoker -- 1.00 packs/day for 30 years    Types: Cigarettes  . Smokeless tobacco: Never Used     Comment: working on it-down to 0.5 ppd  . Alcohol Use: No  . Drug Use: No  . Sexual Activity: Not on file   Other Topics Concern  . None   Social History Narrative   Past Surgical History  Procedure Laterality Date  . Shoulder surgery   2012    lt  . Multiple tooth extractions    . Ulnar tunnel release  12/15/2011    Procedure: CUBITAL TUNNEL RELEASE;  Surgeon: [name], MD;  Location: [facility];  Service: Orthopedics;  Laterality: Left;  left cubital tunnel release  . Carpal tunnel release  12/15/2011    Procedure: CARPAL TUNNEL RELEASE;  Surgeon: [name], MD;  Location:  [facility];  Service: Orthopedics;  Laterality: Left;  . Bilateral tubes placed in ears  04/12/2012   Past Medical History  Diagnosis Date  . Lumbosacral neuritis   . Rotator cuff (capsule) sprain   . Disturbance of skin sensation   . Chronic pain due to trauma   . Intercostal neuralgia   . Allergy   . Snores   . Myocardial infarction    BP 120/76 mmHg  Pulse 61  Resp 16  SpO2 94%  Opioid Risk Score:   Fall Risk Score:  `1  Depression screen PHQ 2/9  Depression screen [facility] 2/9 02/01/2015 09/28/2014  Decreased Interest 0 3  Down, Depressed, Hopeless 0 2  PHQ - 2 Score 0 5  Altered sleeping - 3  Tired, decreased energy - 3  Feeling bad or failure about yourself  - 3  Trouble concentrating - 2  Moving slowly or fidgety/restless - 2  Suicidal thoughts - 0  PHQ-9 Score - 18    Review of Systems  Musculoskeletal: Positive for back pain and gait problem.  All other systems reviewed and are negative.      Objective:   Physical Exam  Constitutional: He is oriented to person, place, and time. He appears well-developed and well-nourished.  HENT:  Head: Normocephalic and atraumatic.  Neck: Normal range of motion. Neck supple.  Cardiovascular: Normal rate and regular rhythm.   Pulmonary/Chest: Effort normal and breath sounds normal.  Musculoskeletal:  Normal Muscle Bulk and Muscle Testing Reveals: Upper Extremities: Right:Full ROM and Muscle Strength 5/5 Left: Decreased ROM 90 Degrees and Muscle Strength 5/5 Thoracic and Lumbar Hypersensitivity Lower Extremities: Left: Full ROM and Muscle Strength  5/5 Right: Decreased ROM and Muscle Strength 4/5 Bilateral Lower Extremities Flexion Produces pain into Bilateral Hips Arises from chair slowly using straight cane for support Antalgic gait    Neurological: He is alert and oriented to person, place, and time.  Skin: Skin is warm and dry.  Psychiatric: He has a normal mood and affect.  Nursing note and vitals reviewed.         Assessment & Plan:  1. 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  Refilled: Hydrocodone 10/325 mg #120 pills--use every 6 hours as needed. No script given has medication  2. Intercostal neuralgia: Continue Robaxin and Neurontin  3. Insomnia: Continue trazodone.   15 minutes of face to face patient care time was spent during this visit. All questions were encouraged and answered.   F/U in 1 month

## 2015-02-02 LAB — PMP ALCOHOL METABOLITE (ETG): ETGU: NEGATIVE ng/mL

## 2015-02-07 LAB — OPIATES/OPIOIDS (LC/MS-MS)
Codeine Urine: NEGATIVE ng/mL (ref <50)
HYDROMORPHONE: 902 ng/mL (ref <50)
Hydrocodone: 5699 ng/mL (ref <50)
Morphine Urine: NEGATIVE ng/mL (ref <50)
NOROXYCODONE, UR: NEGATIVE ng/mL (ref <50)
Norhydrocodone, Ur: 2539 ng/mL (ref <50)
OXYCODONE, UR: NEGATIVE ng/mL (ref <50)
OXYMORPHONE, URINE: NEGATIVE ng/mL (ref <50)

## 2015-02-08 LAB — PRESCRIPTION MONITORING PROFILE (SOLSTAS)
Amphetamine/Meth: NEGATIVE ng/mL
BARBITURATE SCREEN, URINE: NEGATIVE ng/mL
Benzodiazepine Screen, Urine: NEGATIVE ng/mL
Buprenorphine, Urine: NEGATIVE ng/mL
CARISOPRODOL, URINE: NEGATIVE ng/mL
COCAINE METABOLITES: NEGATIVE ng/mL
Cannabinoid Scrn, Ur: NEGATIVE ng/mL
Creatinine, Urine: 129.32 mg/dL (ref >20.0)
Fentanyl, Ur: NEGATIVE ng/mL
MDMA URINE: NEGATIVE ng/mL
MEPERIDINE UR: NEGATIVE ng/mL
METHADONE SCREEN, URINE: NEGATIVE ng/mL
NITRITES URINE, INITIAL: NEGATIVE ug/mL
Oxycodone Screen, Ur: NEGATIVE ng/mL
PH URINE, INITIAL: 6.4 pH (ref 4.5–8.9)
PROPOXYPHENE: NEGATIVE ng/mL
Tapentadol, urine: NEGATIVE ng/mL
Tramadol Scrn, Ur: NEGATIVE ng/mL
Zolpidem, Urine: NEGATIVE ng/mL

## 2015-02-15 NOTE — Progress Notes (Signed)
Urine drug screen for this encounter is consistent for prescribed medication 

## 2015-02-28 ENCOUNTER — Encounter: Payer: Worker's Compensation | Attending: Registered Nurse | Admitting: Registered Nurse

## 2015-02-28 ENCOUNTER — Encounter: Payer: Self-pay | Admitting: Registered Nurse

## 2015-02-28 VITALS — BP 127/74 | HR 58 | Resp 14

## 2015-02-28 DIAGNOSIS — Z79891 Long term (current) use of opiate analgesic: Secondary | ICD-10-CM | POA: Insufficient documentation

## 2015-02-28 DIAGNOSIS — M545 Low back pain: Secondary | ICD-10-CM | POA: Insufficient documentation

## 2015-02-28 DIAGNOSIS — M47817 Spondylosis without myelopathy or radiculopathy, lumbosacral region: Secondary | ICD-10-CM

## 2015-02-28 DIAGNOSIS — M25552 Pain in left hip: Secondary | ICD-10-CM | POA: Diagnosis not present

## 2015-02-28 DIAGNOSIS — Z5181 Encounter for therapeutic drug level monitoring: Secondary | ICD-10-CM | POA: Diagnosis not present

## 2015-02-28 DIAGNOSIS — I252 Old myocardial infarction: Secondary | ICD-10-CM | POA: Insufficient documentation

## 2015-02-28 DIAGNOSIS — Z760 Encounter for issue of repeat prescription: Secondary | ICD-10-CM | POA: Insufficient documentation

## 2015-02-28 DIAGNOSIS — M5417 Radiculopathy, lumbosacral region: Secondary | ICD-10-CM | POA: Diagnosis not present

## 2015-02-28 DIAGNOSIS — M5416 Radiculopathy, lumbar region: Secondary | ICD-10-CM

## 2015-02-28 DIAGNOSIS — G8921 Chronic pain due to trauma: Secondary | ICD-10-CM | POA: Diagnosis not present

## 2015-02-28 DIAGNOSIS — F17210 Nicotine dependence, cigarettes, uncomplicated: Secondary | ICD-10-CM | POA: Insufficient documentation

## 2015-02-28 DIAGNOSIS — G588 Other specified mononeuropathies: Secondary | ICD-10-CM | POA: Diagnosis not present

## 2015-02-28 DIAGNOSIS — G4700 Insomnia, unspecified: Secondary | ICD-10-CM | POA: Insufficient documentation

## 2015-02-28 DIAGNOSIS — R209 Unspecified disturbances of skin sensation: Secondary | ICD-10-CM | POA: Insufficient documentation

## 2015-02-28 DIAGNOSIS — M25551 Pain in right hip: Secondary | ICD-10-CM | POA: Insufficient documentation

## 2015-02-28 DIAGNOSIS — R52 Pain, unspecified: Secondary | ICD-10-CM | POA: Insufficient documentation

## 2015-02-28 DIAGNOSIS — Z79899 Other long term (current) drug therapy: Secondary | ICD-10-CM

## 2015-02-28 DIAGNOSIS — G8929 Other chronic pain: Secondary | ICD-10-CM

## 2015-02-28 MED ORDER — HYDROCODONE-ACETAMINOPHEN 10-325 MG PO TABS: 1.0000 | ORAL_TABLET | Freq: Four times a day (QID) | ORAL | Status: DC | PRN

## 2015-02-28 NOTE — Progress Notes (Signed)
Subjective:    Patient ID: Matthew Sloan, male    DOB: 07/17/1960, 54 y.o.   MRN: [id number]  HPI: [name] is a 54 year old male who returns for follow up for chronic pain and medication refill.He says his pain is located in his lower back and bilateral hips right greater than left and complaining of generalized pain all over his body. He rates his pain 9. His current exercise regime is walking and performing stretching exercises using the bands and balls daily. Also states he's dizzy occasionally, this is a chronic problem for him he states. His PCP following.  Pain Inventory Average Pain 8 Pain Right Now 9 My pain is constant, sharp, burning, stabbing, tingling and aching  In the last 24 hours, has pain interfered with the following? General activity 10 Relation with others 9 Enjoyment of life 9 What TIME of day is your pain at its worst? all Sleep (in general) Poor  Pain is worse with: walking, bending, sitting, inactivity and standing Pain improves with: medication Relief from Meds: 1  Mobility walk with assistance use a cane Do you have any goals in this area?  yes  Function disabled: date disabled . Do you have any goals in this area?  yes  Neuro/Psych weakness numbness tremor tingling trouble walking spasms dizziness  Prior Studies Any changes since last visit?  no  Physicians involved in your care Any changes since last visit?  no   Family History  Problem Relation Age of Onset  . Cancer Mother 74    breast cancer  . Heart disease Father 68    CHF   Social History   Social History  . Marital Status: Single    Spouse Name: N/A  . Number of Children: N/A  . Years of Education: N/A   Social History Main Topics  . Smoking status: Current Every Day Smoker -- 1.00 packs/day for 30 years    Types: Cigarettes  . Smokeless tobacco: Never Used     Comment: working on it-down to 0.5 ppd  . Alcohol Use: No  . Drug Use: No  . Sexual  Activity: Not Asked   Other Topics Concern  . None   Social History Narrative   Past Surgical History  Procedure Laterality Date  . Shoulder surgery  2012    lt  . Multiple tooth extractions    . Ulnar tunnel release  12/15/2011    Procedure: CUBITAL TUNNEL RELEASE;  Surgeon: [name], MD;  Location: [facility];  Service: Orthopedics;  Laterality: Left;  left cubital tunnel release  . Carpal tunnel release  12/15/2011    Procedure: CARPAL TUNNEL RELEASE;  Surgeon: [name], MD;  Location: [facility];  Service: Orthopedics;  Laterality: Left;  . Bilateral tubes placed in ears  04/12/2012   Past Medical History  Diagnosis Date  . Lumbosacral neuritis   . Rotator cuff (capsule) sprain   . Disturbance of skin sensation   . Chronic pain due to trauma   . Intercostal neuralgia   . Allergy   . Snores   . Myocardial infarction    BP 127/74 mmHg  Pulse 58  Resp 14  SpO2 96%  Opioid Risk Score:   Fall Risk Score:  `1  Depression screen PHQ 2/9  Depression screen [facility] 2/9 02/01/2015 09/28/2014  Decreased Interest 0 3  Down, Depressed, Hopeless 0 2  PHQ - 2 Score 0 5  Altered sleeping -  3  Tired, decreased energy - 3  Feeling bad or failure about yourself  - 3  Trouble concentrating - 2  Moving slowly or fidgety/restless - 2  Suicidal thoughts - 0  PHQ-9 Score - 18     Review of Systems  Musculoskeletal: Positive for gait problem.  Neurological: Positive for dizziness, tremors, weakness and numbness.       Tingling Spasms   All other systems reviewed and are negative.      Objective:   Physical Exam  Constitutional: He is oriented to person, place, and time. He appears well-developed and well-nourished.  HENT:  Head: Normocephalic and atraumatic.  Neck: Normal range of motion. Neck supple.  Cardiovascular: Normal rate and regular rhythm.   Pulmonary/Chest: Effort normal and breath sounds normal.  Musculoskeletal:  Normal  Muscle Bulk and Muscle Testing Reveals: Upper Extremities: Right:Full ROM and Muscle Strength 5/5 Left: Decreased ROM 45 Degrees and Muscle Strength 4/5 Thoracic and Lumbar Hypersensitivity Lower Extremities:Left: Full ROM and Muscle Strength 5/5 Right: Decreased ROM and Muscle Strength 4/5  Right Lower Extremity Flexion Produces pain into Right Hip and Lower Extremity Arises from chair slowly/ using straight cane for support Antalgic gait  Neurological: He is alert and oriented to person, place, and time.  Skin: Skin is warm and dry.  Psychiatric: He has a normal mood and affect.  Nursing note and vitals reviewed.         Assessment & Plan:  1. 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  Refilled: Hydrocodone 10/325 mg #120 pills--use every 6 hours as needed.  2. Intercostal neuralgia: Continue Robaxin and Neurontin  3. Insomnia: Continue trazodone.   15 minutes of face to face patient care time was spent during this visit. All questions were encouraged and answered.   F/U in 1 month

## 2015-03-05 ENCOUNTER — Other Ambulatory Visit: Payer: Self-pay | Admitting: *Deleted

## 2015-03-05 MED ORDER — GABAPENTIN 800 MG PO TABS: 800.0000 mg | ORAL_TABLET | Freq: Three times a day (TID) | ORAL | Status: DC

## 2015-03-29 ENCOUNTER — Encounter: Payer: Worker's Compensation | Admitting: Registered Nurse

## 2015-04-10 ENCOUNTER — Encounter: Payer: Self-pay | Admitting: Registered Nurse

## 2015-04-10 ENCOUNTER — Encounter: Payer: Worker's Compensation | Attending: Registered Nurse | Admitting: Registered Nurse

## 2015-04-10 VITALS — BP 133/80 | HR 65 | Resp 14

## 2015-04-10 DIAGNOSIS — M5417 Radiculopathy, lumbosacral region: Secondary | ICD-10-CM | POA: Diagnosis not present

## 2015-04-10 DIAGNOSIS — G4700 Insomnia, unspecified: Secondary | ICD-10-CM | POA: Diagnosis not present

## 2015-04-10 DIAGNOSIS — Z760 Encounter for issue of repeat prescription: Secondary | ICD-10-CM | POA: Diagnosis present

## 2015-04-10 DIAGNOSIS — Z5181 Encounter for therapeutic drug level monitoring: Secondary | ICD-10-CM

## 2015-04-10 DIAGNOSIS — M545 Low back pain: Secondary | ICD-10-CM | POA: Diagnosis not present

## 2015-04-10 DIAGNOSIS — F17210 Nicotine dependence, cigarettes, uncomplicated: Secondary | ICD-10-CM | POA: Insufficient documentation

## 2015-04-10 DIAGNOSIS — G588 Other specified mononeuropathies: Secondary | ICD-10-CM | POA: Diagnosis not present

## 2015-04-10 DIAGNOSIS — Z79891 Long term (current) use of opiate analgesic: Secondary | ICD-10-CM | POA: Diagnosis not present

## 2015-04-10 DIAGNOSIS — M5416 Radiculopathy, lumbar region: Secondary | ICD-10-CM | POA: Diagnosis not present

## 2015-04-10 DIAGNOSIS — G8921 Chronic pain due to trauma: Secondary | ICD-10-CM | POA: Insufficient documentation

## 2015-04-10 DIAGNOSIS — R209 Unspecified disturbances of skin sensation: Secondary | ICD-10-CM | POA: Insufficient documentation

## 2015-04-10 DIAGNOSIS — Z79899 Other long term (current) drug therapy: Secondary | ICD-10-CM | POA: Diagnosis not present

## 2015-04-10 DIAGNOSIS — I252 Old myocardial infarction: Secondary | ICD-10-CM | POA: Diagnosis not present

## 2015-04-10 DIAGNOSIS — M25552 Pain in left hip: Secondary | ICD-10-CM | POA: Diagnosis not present

## 2015-04-10 DIAGNOSIS — M25551 Pain in right hip: Secondary | ICD-10-CM | POA: Insufficient documentation

## 2015-04-10 DIAGNOSIS — G894 Chronic pain syndrome: Secondary | ICD-10-CM

## 2015-04-10 DIAGNOSIS — G8929 Other chronic pain: Secondary | ICD-10-CM

## 2015-04-10 DIAGNOSIS — R52 Pain, unspecified: Secondary | ICD-10-CM | POA: Diagnosis not present

## 2015-04-10 MED ORDER — HYDROCODONE-ACETAMINOPHEN 10-325 MG PO TABS: 1.0000 | ORAL_TABLET | Freq: Four times a day (QID) | ORAL | Status: DC | PRN

## 2015-04-10 NOTE — Progress Notes (Signed)
Subjective:    Patient ID: Matthew Sloan, male    DOB: [birth year], 54 y.o.   MRN: [id number]  HPI: [name] is a 54 year old male who returns for follow up for chronic pain and medication refill.He says his pain is located in his lower back and bilateral hips right greater than left and complaining of generalized pain all over his body. He rates his pain 8. His current exercise regime is walking and performing stretching exercises using the bands and balls daily.  Pain Inventory Average Pain 8 Pain Right Now 8 My pain is constant, sharp, burning, stabbing, tingling and aching  In the last 24 hours, has pain interfered with the following? General activity 10 Relation with others 9 Enjoyment of life 9 What TIME of day is your pain at its worst? all Sleep (in general) Poor  Pain is worse with: walking, bending, sitting, inactivity and standing Pain improves with: medication Relief from Meds: 2  Mobility walk with assistance use a cane ability to climb steps?  no do you drive?  yes use a wheelchair Do you have any goals in this area?  yes  Function disabled: date disabled .  Neuro/Psych weakness numbness tingling trouble walking spasms dizziness  Prior Studies Any changes since last visit?  no  Physicians involved in your care Any changes since last visit?  no   Family History  Problem Relation Age of Onset  . Cancer Mother 38    breast cancer  . Heart disease Father 95    CHF   Social History   Social History  . Marital Status: Single    Spouse Name: N/A  . Number of Children: N/A  . Years of Education: N/A   Social History Main Topics  . Smoking status: Current Every Day Smoker -- 1.00 packs/day for 30 years    Types: Cigarettes  . Smokeless tobacco: Never Used     Comment: working on it-down to 0.5 ppd  . Alcohol Use: No  . Drug Use: No  . Sexual Activity: Not Asked   Other Topics Concern  . None   Social History Narrative   Past  Surgical History  Procedure Laterality Date  . Shoulder surgery  2012    lt  . Multiple tooth extractions    . Ulnar tunnel release  12/15/2011    Procedure: CUBITAL TUNNEL RELEASE;  Surgeon: [name], MD;  Location: [facility];  Service: Orthopedics;  Laterality: Left;  left cubital tunnel release  . Carpal tunnel release  12/15/2011    Procedure: CARPAL TUNNEL RELEASE;  Surgeon: [name], MD;  Location: [facility];  Service: Orthopedics;  Laterality: Left;  . Bilateral tubes placed in ears  04/12/2012   Past Medical History  Diagnosis Date  . Lumbosacral neuritis   . Rotator cuff (capsule) sprain   . Disturbance of skin sensation   . Chronic pain due to trauma   . Intercostal neuralgia   . Allergy   . Snores   . Myocardial infarction    BP 133/80 mmHg  Pulse 65  Resp 14  SpO2 94%  Opioid Risk Score:   Fall Risk Score:  `1  Depression screen PHQ 2/9  Depression screen [facility] 2/9 02/01/2015 09/28/2014  Decreased Interest 0 3  Down, Depressed, Hopeless 0 2  PHQ - 2 Score 0 5  Altered sleeping - 3  Tired, decreased energy - 3  Feeling bad or failure about  yourself  - 3  Trouble concentrating - 2  Moving slowly or fidgety/restless - 2  Suicidal thoughts - 0  PHQ-9 Score - 18     Review of Systems  Musculoskeletal: Positive for gait problem.  Neurological: Positive for weakness and numbness.       Tingling Spasms   All other systems reviewed and are negative.      Objective:   Physical Exam  Constitutional: He is oriented to person, place, and time. He appears well-developed and well-nourished.  HENT:  Head: Normocephalic and atraumatic.  Neck: Normal range of motion. Neck supple.  Cardiovascular: Normal rate and regular rhythm.   Pulmonary/Chest: Effort normal and breath sounds normal.  Musculoskeletal:  Normal Muscle Bulk and Muscle Testing Reveals: Upper Extremities: Right:Full ROM and Muscle Strength 5/5. Left: Decreased  ROM 45 Degrees  and Muscle Strength 4/5 Thoracic Paraspinal Tenderness: T-10- T-12 Right Side Hypersensitivity Lower Extremities: Decreased ROM and Muscle Strength 4/5 Right Lower Extremity Flexion Produces Pain into Right Hip and Lower Extremity Left Lower Extremity Flexion Produces Pain into Left Hip Arises from chair slowly Antalgic Gait  Neurological: He is alert and oriented to person, place, and time.  Skin: Skin is warm and dry.  Psychiatric: He has a normal mood and affect.  Nursing note and vitals reviewed.         Assessment & Plan:  1. 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  Refilled: Hydrocodone 10/325 mg #120 pills--use every 6 hours as needed.  2. Intercostal neuralgia: Continue Robaxin and Neurontin  3. Insomnia: Continue trazodone.   15 minutes of face to face patient care time was spent during this visit. All questions were encouraged and answered.   F/U in 1 month

## 2015-04-11 ENCOUNTER — Other Ambulatory Visit: Payer: Self-pay | Admitting: *Deleted

## 2015-04-11 MED ORDER — DICLOFENAC EPOLAMINE 1.3 % TD PTCH: 1.0000 | MEDICATED_PATCH | Freq: Two times a day (BID) | TRANSDERMAL | Status: DC

## 2015-04-17 ENCOUNTER — Telehealth: Payer: Self-pay | Admitting: Registered Nurse
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
## 2015-04-24 ENCOUNTER — Telehealth: Payer: Self-pay | Admitting: Registered Nurse

## 2015-04-24 ENCOUNTER — Other Ambulatory Visit: Payer: Self-pay | Admitting: Physical Medicine & Rehabilitation

## 2015-04-24 MED ORDER — HYDROCODONE-ACETAMINOPHEN 10-325 MG PO TABS: 1.0000 | ORAL_TABLET | Freq: Four times a day (QID) | ORAL | Status: DC | PRN

## 2015-04-24 NOTE — Telephone Encounter (Signed)
Placed a call to [name], we have obtain the police report. Will print new prescription today. Left message for [name] to return the call.

## 2015-04-25 ENCOUNTER — Other Ambulatory Visit: Payer: Self-pay | Admitting: *Deleted

## 2015-04-25 MED ORDER — TRAZODONE HCL 100 MG PO TABS
ORAL_TABLET | ORAL | Status: DC
Start: 2015-04-25 — End: 2015-08-13

## 2015-05-10 ENCOUNTER — Encounter: Payer: Self-pay | Admitting: Registered Nurse

## 2015-05-10 ENCOUNTER — Encounter: Payer: Worker's Compensation | Attending: Registered Nurse | Admitting: Registered Nurse

## 2015-05-10 VITALS — BP 163/90 | HR 67

## 2015-05-10 DIAGNOSIS — G8921 Chronic pain due to trauma: Secondary | ICD-10-CM | POA: Diagnosis not present

## 2015-05-10 DIAGNOSIS — M25552 Pain in left hip: Secondary | ICD-10-CM | POA: Insufficient documentation

## 2015-05-10 DIAGNOSIS — M25551 Pain in right hip: Secondary | ICD-10-CM | POA: Diagnosis not present

## 2015-05-10 DIAGNOSIS — R209 Unspecified disturbances of skin sensation: Secondary | ICD-10-CM | POA: Insufficient documentation

## 2015-05-10 DIAGNOSIS — Z760 Encounter for issue of repeat prescription: Secondary | ICD-10-CM | POA: Diagnosis present

## 2015-05-10 DIAGNOSIS — R52 Pain, unspecified: Secondary | ICD-10-CM | POA: Insufficient documentation

## 2015-05-10 DIAGNOSIS — Z5181 Encounter for therapeutic drug level monitoring: Secondary | ICD-10-CM

## 2015-05-10 DIAGNOSIS — G4700 Insomnia, unspecified: Secondary | ICD-10-CM | POA: Insufficient documentation

## 2015-05-10 DIAGNOSIS — M5416 Radiculopathy, lumbar region: Secondary | ICD-10-CM | POA: Diagnosis not present

## 2015-05-10 DIAGNOSIS — I252 Old myocardial infarction: Secondary | ICD-10-CM | POA: Diagnosis not present

## 2015-05-10 DIAGNOSIS — G588 Other specified mononeuropathies: Secondary | ICD-10-CM | POA: Insufficient documentation

## 2015-05-10 DIAGNOSIS — Z79891 Long term (current) use of opiate analgesic: Secondary | ICD-10-CM | POA: Insufficient documentation

## 2015-05-10 DIAGNOSIS — M5417 Radiculopathy, lumbosacral region: Secondary | ICD-10-CM | POA: Insufficient documentation

## 2015-05-10 DIAGNOSIS — G8929 Other chronic pain: Secondary | ICD-10-CM

## 2015-05-10 DIAGNOSIS — F17210 Nicotine dependence, cigarettes, uncomplicated: Secondary | ICD-10-CM | POA: Diagnosis not present

## 2015-05-10 DIAGNOSIS — M545 Low back pain: Secondary | ICD-10-CM | POA: Insufficient documentation

## 2015-05-10 DIAGNOSIS — Z79899 Other long term (current) drug therapy: Secondary | ICD-10-CM

## 2015-05-10 NOTE — Progress Notes (Signed)
Subjective:    Patient ID: Matthew Sloan, male    DOB: [birth year], 54 y.o.   MRN: [id number]  HPI: [name] is a 54 year old male who returns for follow up for chronic pain and medication refill.He says his pain is located in his lower back and bilateral hips right greater than left and complaining of generalized pain all over his body. He rates his pain 9. His current exercise regime is walking and performing stretching exercises using bands and balls daily. Workman's Compensation wouldn't pay for the replacement Hydrocodone. He has been without analgesics for thirty days.Pharmacy called his replacement prescription still on file no script given.  Pain Inventory Average Pain 8 Pain Right Now 9 My pain is constant, sharp, burning, stabbing, tingling and aching  In the last 24 hours, has pain interfered with the following? General activity 9 Relation with others 8 Enjoyment of life 9 What TIME of day is your pain at its worst? Morning, Daytime, Evening and Night Sleep (in general) Poor  Pain is worse with: walking, bending, sitting, inactivity and standing Pain improves with: NA Relief from Meds: 2  Mobility walk with assistance use a cane Do you have any goals in this area?  yes  Function disabled: date disabled NA Do you have any goals in this area?  yes  Neuro/Psych weakness numbness tremor tingling trouble walking spasms  Prior Studies Any changes since last visit?  no  Physicians involved in your care Any changes since last visit?  no   Family History  Problem Relation Age of Onset  . Cancer Mother 8045    breast cancer  . Heart disease Father [id number]    CHF   Social History   Social History  . Marital Status: Single    Spouse Name: N/A  . Number of Children: N/A  . Years of Education: N/A   Social History Main Topics  . Smoking status: Current Every Day Smoker -- 1.00 packs/day for 30 years    Types: Cigarettes  . Smokeless tobacco: Never Used      Comment: working on it-down to 0.5 ppd  . Alcohol Use: No  . Drug Use: No  . Sexual Activity: Not Asked   Other Topics Concern  . None   Social History Narrative   Past Surgical History  Procedure Laterality Date  . Shoulder surgery  2012    lt  . Multiple tooth extractions    . Ulnar tunnel release  12/15/2011    Procedure: CUBITAL TUNNEL RELEASE;  Surgeon: Tami RibasKevin R Kuzma, MD;  Location: [facility];  Service: Orthopedics;  Laterality: Left;  left cubital tunnel release  . Carpal tunnel release  12/15/2011    Procedure: CARPAL TUNNEL RELEASE;  Surgeon: Tami RibasKevin R Kuzma, MD;  Location: [facility];  Service: Orthopedics;  Laterality: Left;  . Bilateral tubes placed in ears  04/12/2012   Past Medical History  Diagnosis Date  . Lumbosacral neuritis   . Rotator cuff (capsule) sprain   . Disturbance of skin sensation   . Chronic pain due to trauma   . Intercostal neuralgia   . Allergy   . Snores   . Myocardial infarction (HCC)    BP 163/90 mmHg  Pulse 67  SpO2 93%  Opioid Risk Score:   Fall Risk Score:  `1  Depression screen PHQ 2/9  [facility] 02/01/2015 09/28/2014  Decreased Interest 0 3  Down, Depressed, Hopeless 0 2  PHQ - 2 Score 0 5  Altered sleeping - 3  Tired, decreased energy - 3  Feeling bad or failure about yourself  - 3  Trouble concentrating - 2  Moving slowly or fidgety/restless - 2  Suicidal thoughts - 0  PHQ-9 Score - 18     Review of Systems  Musculoskeletal:       Spasms  Neurological: Positive for dizziness, weakness and numbness.       Tingling Gait Instability  All other systems reviewed and are negative.      Objective:   Physical Exam  Constitutional: He is oriented to person, place, and time. He appears well-developed and well-nourished.  HENT:  Head: Normocephalic and atraumatic.  Neck: Normal range of motion. Neck supple.  Cardiovascular: Normal rate and regular rhythm.     Pulmonary/Chest: Effort normal and breath sounds normal.  Musculoskeletal:  Normal Muscle Bulk and Muscle Testing Reveals: Upper Extremities: Right: Full ROM and Muscle Strength 4/5 Left: Decreased ROM 45 Degrees and Muscle Strength 4/5 Thoracic and Lumbar Hypersensitivity Lower Extremities: Decreased ROM and Muscle Strength 4/5 Right Lower Extremity Flexion Produces pain into Hip and Lumbar Arises from chair slowly Using straight cane for support Antalgic gait  Neurological: He is alert and oriented to person, place, and time.  Skin: Skin is warm and dry.  Psychiatric: He has a normal mood and affect.  Nursing note and vitals reviewed.         Assessment & Plan:  1. 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  Refilled: Hydrocodone 10/325 mg #120 pills--use every 6 hours as needed.  2. Intercostal neuralgia: Continue Robaxin and Neurontin  3. Insomnia: Continue trazodone.   15 minutes of face to face patient care time was spent during this visit. All questions were encouraged and answered.   F/U in 1 month

## 2015-06-03 ENCOUNTER — Encounter: Payer: Worker's Compensation | Admitting: Registered Nurse

## 2015-06-05 ENCOUNTER — Encounter: Payer: Self-pay | Admitting: Registered Nurse

## 2015-06-05 ENCOUNTER — Encounter: Payer: Worker's Compensation | Attending: Registered Nurse | Admitting: Registered Nurse

## 2015-06-05 VITALS — BP 134/82 | HR 71 | Resp 14

## 2015-06-05 DIAGNOSIS — G588 Other specified mononeuropathies: Secondary | ICD-10-CM | POA: Insufficient documentation

## 2015-06-05 DIAGNOSIS — Z5181 Encounter for therapeutic drug level monitoring: Secondary | ICD-10-CM

## 2015-06-05 DIAGNOSIS — M5417 Radiculopathy, lumbosacral region: Secondary | ICD-10-CM | POA: Insufficient documentation

## 2015-06-05 DIAGNOSIS — Z760 Encounter for issue of repeat prescription: Secondary | ICD-10-CM | POA: Diagnosis not present

## 2015-06-05 DIAGNOSIS — M25552 Pain in left hip: Secondary | ICD-10-CM | POA: Insufficient documentation

## 2015-06-05 DIAGNOSIS — R52 Pain, unspecified: Secondary | ICD-10-CM | POA: Diagnosis not present

## 2015-06-05 DIAGNOSIS — M545 Low back pain: Secondary | ICD-10-CM | POA: Insufficient documentation

## 2015-06-05 DIAGNOSIS — I252 Old myocardial infarction: Secondary | ICD-10-CM | POA: Diagnosis not present

## 2015-06-05 DIAGNOSIS — G8929 Other chronic pain: Secondary | ICD-10-CM

## 2015-06-05 DIAGNOSIS — M25551 Pain in right hip: Secondary | ICD-10-CM | POA: Diagnosis not present

## 2015-06-05 DIAGNOSIS — Z79891 Long term (current) use of opiate analgesic: Secondary | ICD-10-CM | POA: Diagnosis not present

## 2015-06-05 DIAGNOSIS — F17210 Nicotine dependence, cigarettes, uncomplicated: Secondary | ICD-10-CM | POA: Insufficient documentation

## 2015-06-05 DIAGNOSIS — Z79899 Other long term (current) drug therapy: Secondary | ICD-10-CM

## 2015-06-05 DIAGNOSIS — M5416 Radiculopathy, lumbar region: Secondary | ICD-10-CM

## 2015-06-05 DIAGNOSIS — G8921 Chronic pain due to trauma: Secondary | ICD-10-CM

## 2015-06-05 DIAGNOSIS — R209 Unspecified disturbances of skin sensation: Secondary | ICD-10-CM | POA: Insufficient documentation

## 2015-06-05 DIAGNOSIS — G4700 Insomnia, unspecified: Secondary | ICD-10-CM | POA: Insufficient documentation

## 2015-06-05 MED ORDER — HYDROCODONE-ACETAMINOPHEN 10-325 MG PO TABS: 1.0000 | ORAL_TABLET | Freq: Four times a day (QID) | ORAL | Status: DC | PRN

## 2015-06-05 NOTE — Progress Notes (Signed)
Subjective:    Patient ID: Matthew Sloan, male    DOB: 14-Jul-1960, 55 y.o.   MRN: [id number]  HPI: Matthew Sloan is a 54 year old male who returns for follow up for chronic pain and medication refill.He says his pain is located in his lower back and bilateral hips right greater than left and complaining of generalized pain all over his body. He rates his pain 9. His current exercise regime is walking and performing stretching exercises using bands and balls daily.  Pain Inventory Average Pain 8 Pain Right Now 9 My pain is constant, sharp, burning, stabbing, tingling and aching  In the last 24 hours, has pain interfered with the following? General activity 9 Relation with others 9 Enjoyment of life 9 What TIME of day is your pain at its worst? all Sleep (in general) Poor  Pain is worse with: walking, bending, sitting, inactivity and standing Pain improves with: medication Relief from Meds: 2  Mobility use a cane Do you have any goals in this area?  yes  Function disabled: date disabled . Do you have any goals in this area?  yes  Neuro/Psych weakness numbness tingling trouble walking spasms  Prior Studies Any changes since last visit?  no  Physicians involved in your care Any changes since last visit?  no   Family History  Problem Relation Age of Onset  . Cancer Mother 2    breast cancer  . Heart disease Father 31    CHF   Social History   Social History  . Marital Status: Single    Spouse Name: N/A  . Number of Children: N/A  . Years of Education: N/A   Social History Main Topics  . Smoking status: Current Every Day Smoker -- 1.00 packs/day for 30 years    Types: Cigarettes  . Smokeless tobacco: Never Used     Comment: working on it-down to 0.5 ppd  . Alcohol Use: No  . Drug Use: No  . Sexual Activity: Not Asked   Other Topics Concern  . None   Social History Narrative   Past Surgical History  Procedure Laterality Date  . Shoulder  surgery  2012    lt  . Multiple tooth extractions    . Ulnar tunnel release  12/15/2011    Procedure: CUBITAL TUNNEL RELEASE;  Surgeon: [name], MD;  Location: [facility];  Service: Orthopedics;  Laterality: Left;  left cubital tunnel release  . Carpal tunnel release  12/15/2011    Procedure: CARPAL TUNNEL RELEASE;  Surgeon: [name], MD;  Location: [facility];  Service: Orthopedics;  Laterality: Left;  . Bilateral tubes placed in ears  04/12/2012   Past Medical History  Diagnosis Date  . Lumbosacral neuritis   . Rotator cuff (capsule) sprain   . Disturbance of skin sensation   . Chronic pain due to trauma   . Intercostal neuralgia   . Allergy   . Snores   . Myocardial infarction (HCC)    BP 134/82 mmHg  Pulse 71  Resp 14  SpO2 95%  Opioid Risk Score:   Fall Risk Score:  `1  Depression screen PHQ 2/9  Depression screen [facility] 2/9 02/01/2015 09/28/2014  Decreased Interest 0 3  Down, Depressed, Hopeless 0 2  PHQ - 2 Score 0 5  Altered sleeping - 3  Tired, decreased energy - 3  Feeling bad or failure about yourself  - 3  Trouble concentrating -  2  Moving slowly or fidgety/restless - 2  Suicidal thoughts - 0  PHQ-9 Score - 18     Review of Systems  Musculoskeletal: Positive for gait problem.       Spasms   Neurological: Positive for weakness and numbness.       Tingling  All other systems reviewed and are negative.      Objective:   Physical Exam  Constitutional: He is oriented to person, place, and time. He appears well-developed and well-nourished.  HENT:  Head: Normocephalic and atraumatic.  Neck: Normal range of motion. Neck supple.  Cardiovascular: Normal rate and regular rhythm.   Pulmonary/Chest: Effort normal and breath sounds normal.  Musculoskeletal:  Normal Muscle Bulk and Muscle Testing Reveals: Upper Extremities: Right: Full ROM and Muscle Strength 5/5 Left: Decreased ROM 45 Degrees and Muscle Strength  4/5 Lumbar Paraspinal Tenderness: L-3- L-5 Lower Extremities: Right: Decreased ROM and Muscle Strength 4/5 Right Lower Extremity Flexion Produces Pain into Right Hip and Lower Back Left: Full ROM and Muscle Strength 5/5 Arises from chair slowly/ using straight cane for support Antalgic Gait  Neurological: He is alert and oriented to person, place, and time.  Skin: Skin is warm and dry.  Psychiatric: He has a normal mood and affect.  Nursing note and vitals reviewed.         Assessment & Plan:  1. 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  Refilled: Hydrocodone 10/325 mg #120 pills--use every 6 hours as needed.  2. Intercostal neuralgia: Continue Robaxin and Neurontin  3. Insomnia: Continue trazodone.   15 minutes of face to face patient care time was spent during this visit. All questions were encouraged and answered.   F/U in 1 month

## 2015-07-02 ENCOUNTER — Encounter: Payer: Worker's Compensation | Admitting: Registered Nurse

## 2015-07-12 ENCOUNTER — Other Ambulatory Visit: Payer: Self-pay | Admitting: Physical Medicine & Rehabilitation

## 2015-07-12 ENCOUNTER — Encounter: Payer: Self-pay | Admitting: Registered Nurse

## 2015-07-12 ENCOUNTER — Encounter: Payer: Worker's Compensation | Attending: Registered Nurse | Admitting: Registered Nurse

## 2015-07-12 VITALS — BP 147/68 | HR 70

## 2015-07-12 DIAGNOSIS — M545 Low back pain: Secondary | ICD-10-CM | POA: Insufficient documentation

## 2015-07-12 DIAGNOSIS — M25552 Pain in left hip: Secondary | ICD-10-CM | POA: Insufficient documentation

## 2015-07-12 DIAGNOSIS — M5417 Radiculopathy, lumbosacral region: Secondary | ICD-10-CM | POA: Diagnosis not present

## 2015-07-12 DIAGNOSIS — G894 Chronic pain syndrome: Secondary | ICD-10-CM

## 2015-07-12 DIAGNOSIS — Z760 Encounter for issue of repeat prescription: Secondary | ICD-10-CM | POA: Insufficient documentation

## 2015-07-12 DIAGNOSIS — M25551 Pain in right hip: Secondary | ICD-10-CM | POA: Insufficient documentation

## 2015-07-12 DIAGNOSIS — I252 Old myocardial infarction: Secondary | ICD-10-CM | POA: Diagnosis not present

## 2015-07-12 DIAGNOSIS — R209 Unspecified disturbances of skin sensation: Secondary | ICD-10-CM | POA: Diagnosis not present

## 2015-07-12 DIAGNOSIS — Z79891 Long term (current) use of opiate analgesic: Secondary | ICD-10-CM | POA: Diagnosis not present

## 2015-07-12 DIAGNOSIS — M5416 Radiculopathy, lumbar region: Secondary | ICD-10-CM

## 2015-07-12 DIAGNOSIS — G8921 Chronic pain due to trauma: Secondary | ICD-10-CM | POA: Insufficient documentation

## 2015-07-12 DIAGNOSIS — G588 Other specified mononeuropathies: Secondary | ICD-10-CM | POA: Insufficient documentation

## 2015-07-12 DIAGNOSIS — F17210 Nicotine dependence, cigarettes, uncomplicated: Secondary | ICD-10-CM | POA: Diagnosis not present

## 2015-07-12 DIAGNOSIS — G4700 Insomnia, unspecified: Secondary | ICD-10-CM | POA: Diagnosis not present

## 2015-07-12 DIAGNOSIS — Z5181 Encounter for therapeutic drug level monitoring: Secondary | ICD-10-CM

## 2015-07-12 DIAGNOSIS — R52 Pain, unspecified: Secondary | ICD-10-CM | POA: Diagnosis not present

## 2015-07-12 DIAGNOSIS — Z79899 Other long term (current) drug therapy: Secondary | ICD-10-CM

## 2015-07-12 DIAGNOSIS — G8929 Other chronic pain: Secondary | ICD-10-CM

## 2015-07-12 MED ORDER — HYDROCODONE-ACETAMINOPHEN 10-325 MG PO TABS: 1.0000 | ORAL_TABLET | Freq: Four times a day (QID) | ORAL | Status: DC | PRN

## 2015-07-12 NOTE — Addendum Note (Signed)
Addended by: Doreene ElandSHUMAKER, Masiya Claassen W on: 07/12/2015 10:11 AM   Modules accepted: Orders

## 2015-07-12 NOTE — Progress Notes (Signed)
Subjective:    Patient ID: Matthew Sloan, male    DOB: [birth year], 54 y.o.   MRN: [id number]  HPI: Matthew Sloan is a 54 year old male who returns for follow up for chronic pain and medication refill.He states his pain is located in his lower back radiating into his right hip also has generalized pain all over his body. He rates his pain 9. His current exercise regime is walking and performing stretching exercises using bands and balls daily.  Pain Inventory Average Pain 9 Pain Right Now 9 My pain is sharp, burning, stabbing, tingling and aching  In the last 24 hours, has pain interfered with the following? General activity 10 Relation with others 8 Enjoyment of life 8 What TIME of day is your pain at its worst? all Sleep (in general) Poor  Pain is worse with: walking, bending, sitting, inactivity and standing Pain improves with: medication Relief from Meds: 2  Mobility use a cane  Function disabled: date disabled .  Neuro/Psych numbness tingling trouble walking spasms  Prior Studies Any changes since last visit?  no  Physicians involved in your care Any changes since last visit?  no   Family History  Problem Relation Age of Onset  . Cancer Mother 22    breast cancer  . Heart disease Father 48    CHF   Social History   Social History  . Marital Status: Single    Spouse Name: N/A  . Number of Children: N/A  . Years of Education: N/A   Social History Main Topics  . Smoking status: Current Every Day Smoker -- 1.00 packs/day for 30 years    Types: Cigarettes  . Smokeless tobacco: Never Used     Comment: working on it-down to 0.5 ppd  . Alcohol Use: No  . Drug Use: No  . Sexual Activity: Not Asked   Other Topics Concern  . None   Social History Narrative   Past Surgical History  Procedure Laterality Date  . Shoulder surgery  2012    lt  . Multiple tooth extractions    . Ulnar tunnel release  12/15/2011    Procedure: CUBITAL TUNNEL RELEASE;   Surgeon: [name], MD;  Location: [facility];  Service: Orthopedics;  Laterality: Left;  left cubital tunnel release  . Carpal tunnel release  12/15/2011    Procedure: CARPAL TUNNEL RELEASE;  Surgeon: [name], MD;  Location: [facility];  Service: Orthopedics;  Laterality: Left;  . Bilateral tubes placed in ears  04/12/2012   Past Medical History  Diagnosis Date  . Lumbosacral neuritis   . Rotator cuff (capsule) sprain   . Disturbance of skin sensation   . Chronic pain due to trauma   . Intercostal neuralgia   . Allergy   . Snores   . Myocardial infarction (HCC)    BP 147/68 mmHg  Pulse 70  SpO2 94%  Opioid Risk Score:   Fall Risk Score:  `1  Depression screen PHQ 2/9  Depression screen [facility] 2/9 02/01/2015 09/28/2014  Decreased Interest 0 3  Down, Depressed, Hopeless 0 2  PHQ - 2 Score 0 5  Altered sleeping - 3  Tired, decreased energy - 3  Feeling bad or failure about yourself  - 3  Trouble concentrating - 2  Moving slowly or fidgety/restless - 2  Suicidal thoughts - 0  PHQ-9 Score - 18     Review of Systems  All other  systems reviewed and are negative.      Objective:   Physical Exam  Constitutional: He is oriented to person, place, and time. He appears well-developed and well-nourished.  HENT:  Head: Normocephalic and atraumatic.  Neck: Normal range of motion. Neck supple.  Cardiovascular: Normal rate and regular rhythm.   Pulmonary/Chest: Effort normal and breath sounds normal.  Musculoskeletal:  Normal Muscle Bulk and Muscle testing Reveals: Upper Extremities: RightFull ROM and Muscle Strength 5/5 Left: Decreased ROM 45 Degrees and Muscle Strength 4/5 Thoracic and Lumbar Hypersensitivity Lower Extremities: Decreased ROM and Muscle Strength 4/5 Bilateral Lower extremities Flexion Produces Pain into Lumbar and Right Hip Arises from chair slowly using straight cane for support Antalgic gait    Neurological: He is alert  and oriented to person, place, and time.  Skin: Skin is warm and dry.  Psychiatric: He has a normal mood and affect.  Nursing note and vitals reviewed.         Assessment & Plan:  1. 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  Refilled: Hydrocodone 10/325 mg #120 pills--use every 6 hours as needed.  2. Intercostal neuralgia: Continue Robaxin and Neurontin  3. Insomnia: Continue trazodone.   15 minutes of face to face patient care time was spent during this visit. All questions were encouraged and answered.   F/U in 1 month

## 2015-07-13 LAB — PMP ALCOHOL METABOLITE (ETG): ETGU: NEGATIVE ng/mL

## 2015-07-15 LAB — OPIATES/OPIOIDS (LC/MS-MS)
CODEINE URINE: NEGATIVE ng/mL (ref <50)
Hydrocodone: 7596 ng/mL (ref <50)
Hydromorphone: 3192 ng/mL (ref <50)
MORPHINE: NEGATIVE ng/mL (ref <50)
NORHYDROCODONE, UR: 4527 ng/mL (ref <50)
NOROXYCODONE, UR: NEGATIVE ng/mL (ref <50)
OXYCODONE, UR: NEGATIVE ng/mL (ref <50)
OXYMORPHONE, URINE: NEGATIVE ng/mL (ref <50)

## 2015-07-17 LAB — PRESCRIPTION MONITORING PROFILE (SOLSTAS)
AMPHETAMINE/METH: NEGATIVE ng/mL
BARBITURATE SCREEN, URINE: NEGATIVE ng/mL
BENZODIAZEPINE SCREEN, URINE: NEGATIVE ng/mL
BUPRENORPHINE, URINE: NEGATIVE ng/mL
Cannabinoid Scrn, Ur: NEGATIVE ng/mL
Carisoprodol, Urine: NEGATIVE ng/mL
Cocaine Metabolites: NEGATIVE ng/mL
Creatinine, Urine: 265.57 mg/dL (ref >20.0)
Fentanyl, Ur: NEGATIVE ng/mL
MDMA URINE: NEGATIVE ng/mL
MEPERIDINE UR: NEGATIVE ng/mL
Methadone Screen, Urine: NEGATIVE ng/mL
NITRITES URINE, INITIAL: NEGATIVE ug/mL
OXYCODONE SCRN UR: NEGATIVE ng/mL
PROPOXYPHENE: NEGATIVE ng/mL
TAPENTADOLUR: NEGATIVE ng/mL
Tramadol Scrn, Ur: NEGATIVE ng/mL
Zolpidem, Urine: NEGATIVE ng/mL
pH, Initial: 5.3 pH (ref 4.5–8.9)

## 2015-07-18 ENCOUNTER — Other Ambulatory Visit: Payer: Self-pay | Admitting: *Deleted

## 2015-07-18 MED ORDER — GABAPENTIN 800 MG PO TABS: 800.0000 mg | ORAL_TABLET | Freq: Three times a day (TID) | ORAL | Status: DC

## 2015-08-09 ENCOUNTER — Encounter: Payer: Self-pay | Admitting: Registered Nurse

## 2015-08-09 ENCOUNTER — Encounter: Payer: Medicare HMO | Attending: Registered Nurse | Admitting: Registered Nurse

## 2015-08-09 ENCOUNTER — Encounter: Payer: Medicare HMO | Admitting: Registered Nurse

## 2015-08-09 VITALS — BP 133/82 | HR 60 | Resp 14

## 2015-08-09 DIAGNOSIS — Z5181 Encounter for therapeutic drug level monitoring: Secondary | ICD-10-CM

## 2015-08-09 DIAGNOSIS — Z79899 Other long term (current) drug therapy: Secondary | ICD-10-CM

## 2015-08-09 DIAGNOSIS — M5416 Radiculopathy, lumbar region: Secondary | ICD-10-CM | POA: Diagnosis not present

## 2015-08-09 DIAGNOSIS — G894 Chronic pain syndrome: Secondary | ICD-10-CM

## 2015-08-09 DIAGNOSIS — R52 Pain, unspecified: Secondary | ICD-10-CM | POA: Insufficient documentation

## 2015-08-09 DIAGNOSIS — G4700 Insomnia, unspecified: Secondary | ICD-10-CM | POA: Diagnosis not present

## 2015-08-09 DIAGNOSIS — I252 Old myocardial infarction: Secondary | ICD-10-CM | POA: Diagnosis not present

## 2015-08-09 DIAGNOSIS — M25552 Pain in left hip: Secondary | ICD-10-CM | POA: Diagnosis not present

## 2015-08-09 DIAGNOSIS — M545 Low back pain: Secondary | ICD-10-CM | POA: Insufficient documentation

## 2015-08-09 DIAGNOSIS — F17210 Nicotine dependence, cigarettes, uncomplicated: Secondary | ICD-10-CM | POA: Insufficient documentation

## 2015-08-09 DIAGNOSIS — Z79891 Long term (current) use of opiate analgesic: Secondary | ICD-10-CM | POA: Insufficient documentation

## 2015-08-09 DIAGNOSIS — G8921 Chronic pain due to trauma: Secondary | ICD-10-CM | POA: Insufficient documentation

## 2015-08-09 DIAGNOSIS — R209 Unspecified disturbances of skin sensation: Secondary | ICD-10-CM | POA: Diagnosis not present

## 2015-08-09 DIAGNOSIS — G588 Other specified mononeuropathies: Secondary | ICD-10-CM | POA: Diagnosis not present

## 2015-08-09 DIAGNOSIS — Z760 Encounter for issue of repeat prescription: Secondary | ICD-10-CM | POA: Insufficient documentation

## 2015-08-09 DIAGNOSIS — M5417 Radiculopathy, lumbosacral region: Secondary | ICD-10-CM | POA: Insufficient documentation

## 2015-08-09 DIAGNOSIS — M25551 Pain in right hip: Secondary | ICD-10-CM | POA: Diagnosis not present

## 2015-08-09 DIAGNOSIS — G8929 Other chronic pain: Secondary | ICD-10-CM

## 2015-08-09 MED ORDER — HYDROCODONE-ACETAMINOPHEN 10-325 MG PO TABS
1.0000 | ORAL_TABLET | Freq: Four times a day (QID) | ORAL | Status: DC | PRN
Start: 2015-08-09 — End: 2015-09-06

## 2015-08-09 NOTE — Progress Notes (Signed)
Subjective:    Patient ID: Matthew Sloan, male    DOB: 04-Apr-1961, 55 y.o.   MRN: [id number]  HPI: Matthew Sloan is a 55 year old male who returns for follow up for chronic pain and medication refill.He states his pain is located in his lower back radiating into his right hip also has generalized pain all over his body. He rates his pain 9. His current exercise regime is walking and performing stretching exercises using bands and balls daily.  Pain Inventory Average Pain 8 Pain Right Now 9 My pain is sharp, burning, stabbing, tingling and aching  In the last 24 hours, has pain interfered with the following? General activity 10 Relation with others 9 Enjoyment of life 9 What TIME of day is your pain at its worst? all Sleep (in general) Poor  Pain is worse with: walking, bending, sitting, inactivity and standing Pain improves with: medication Relief from Meds: 2  Mobility walk with assistance use a cane Do you have any goals in this area?  yes  Function disabled: date disabled . Do you have any goals in this area?  yes  Neuro/Psych weakness numbness tingling trouble walking spasms  Prior Studies Any changes since last visit?  no  Physicians involved in your care Any changes since last visit?  no   Family History  Problem Relation Age of Onset  . Cancer Mother 81    breast cancer  . Heart disease Father 29    CHF   Social History   Social History  . Marital Status: Single    Spouse Name: N/A  . Number of Children: N/A  . Years of Education: N/A   Social History Main Topics  . Smoking status: Current Every Day Smoker -- 1.00 packs/day for 30 years    Types: Cigarettes  . Smokeless tobacco: Never Used     Comment: working on it-down to 0.5 ppd  . Alcohol Use: No  . Drug Use: No  . Sexual Activity: Not Asked   Other Topics Concern  . None   Social History Narrative   Past Surgical History  Procedure Laterality Date  . Shoulder surgery  2012     lt  . Multiple tooth extractions    . Ulnar tunnel release  12/15/2011    Procedure: CUBITAL TUNNEL RELEASE;  Surgeon: [name], MD;  Location: [facility];  Service: Orthopedics;  Laterality: Left;  left cubital tunnel release  . Carpal tunnel release  12/15/2011    Procedure: CARPAL TUNNEL RELEASE;  Surgeon: [name], MD;  Location:  S[facility]ERY [facility];  Service: Orthopedics;  Laterality: Left;  . Bilateral tubes placed in ears  04/12/2012   Past Medical History  Diagnosis Date  . Lumbosacral neuritis   . Rotator cuff (capsule) sprain   . Disturbance of skin sensation   . Chronic pain due to trauma   . Intercostal neuralgia   . Allergy   . Snores   . Myocardial infarction (HCC)    BP 133/82 mmHg  Pulse 60  Resp 14  SpO2 96%  Opioid Risk Score:   Fall Risk Score:  `1  Depression screen PHQ 2/9  Depression screen [facility] 2/9 02/01/2015 09/28/2014  Decreased Interest 0 3  Down, Depressed, Hopeless 0 2  PHQ - 2 Score 0 5  Altered sleeping - 3  Tired, decreased energy - 3  Feeling bad or failure about yourself  - 3  Trouble concentrating - 2  Moving slowly or fidgety/restless - 2  Suicidal thoughts - 0  PHQ-9 Score - 18     Review of Systems  All other systems reviewed and are negative.      Objective:   Physical Exam  Constitutional: He is oriented to person, place, and time. He appears well-developed and well-nourished.  HENT:  Head: Normocephalic and atraumatic.  Neck: Normal range of motion. Neck supple.  Cardiovascular: Normal rate and regular rhythm.   Pulmonary/Chest: Effort normal and breath sounds normal.  Musculoskeletal:  Normal Muscle Bulk and Muscle Testing Reveals: Upper Extremities: Right: Full ROM and Muscle Strength 5/5 Left: Decreased ROM 90 Degrees and Muscle Strength 5/5 Thoracic Hypersensitivity: T-7- T-10 Lumbar Hypersensitivity Lower Extremities: Decreased ROM and Muscle Strength 5/5 Right Lower Extremity  Flexion Produces Pain into Hip and Lumbar Left Lower Extremity Flexion Produces Pain into Lumbar Arises from chair slowly using straight cane for support Antalgic Gait  Neurological: He is alert and oriented to person, place, and time.  Skin: Skin is warm and dry.  Psychiatric: He has a normal mood and affect.  Nursing note and vitals reviewed.         Assessment & Plan:  1. 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  Refilled: Hydrocodone 10/325 mg #120 pills--use every 6 hours as needed.  2. Intercostal neuralgia: Continue Robaxin and Neurontin  3. Insomnia: Continue trazodone.   15 minutes of face to face patient care time was spent during this visit. All questions were encouraged and answered.   F/U in 1 month

## 2015-08-13 ENCOUNTER — Other Ambulatory Visit: Payer: Self-pay | Admitting: *Deleted

## 2015-08-13 MED ORDER — TRAZODONE HCL 100 MG PO TABS: ORAL_TABLET | ORAL | Status: DC

## 2015-09-06 ENCOUNTER — Encounter: Payer: Worker's Compensation | Attending: Registered Nurse | Admitting: Registered Nurse

## 2015-09-06 ENCOUNTER — Encounter: Payer: Self-pay | Admitting: Registered Nurse

## 2015-09-06 VITALS — BP 135/75 | HR 70 | Resp 14

## 2015-09-06 DIAGNOSIS — M545 Low back pain: Secondary | ICD-10-CM | POA: Insufficient documentation

## 2015-09-06 DIAGNOSIS — Z760 Encounter for issue of repeat prescription: Secondary | ICD-10-CM | POA: Diagnosis present

## 2015-09-06 DIAGNOSIS — Z79899 Other long term (current) drug therapy: Secondary | ICD-10-CM

## 2015-09-06 DIAGNOSIS — R52 Pain, unspecified: Secondary | ICD-10-CM | POA: Diagnosis not present

## 2015-09-06 DIAGNOSIS — F17210 Nicotine dependence, cigarettes, uncomplicated: Secondary | ICD-10-CM | POA: Diagnosis not present

## 2015-09-06 DIAGNOSIS — G4700 Insomnia, unspecified: Secondary | ICD-10-CM | POA: Insufficient documentation

## 2015-09-06 DIAGNOSIS — G894 Chronic pain syndrome: Secondary | ICD-10-CM | POA: Diagnosis not present

## 2015-09-06 DIAGNOSIS — I252 Old myocardial infarction: Secondary | ICD-10-CM | POA: Insufficient documentation

## 2015-09-06 DIAGNOSIS — M5416 Radiculopathy, lumbar region: Secondary | ICD-10-CM | POA: Diagnosis not present

## 2015-09-06 DIAGNOSIS — Z79891 Long term (current) use of opiate analgesic: Secondary | ICD-10-CM | POA: Insufficient documentation

## 2015-09-06 DIAGNOSIS — M25552 Pain in left hip: Secondary | ICD-10-CM | POA: Diagnosis not present

## 2015-09-06 DIAGNOSIS — R209 Unspecified disturbances of skin sensation: Secondary | ICD-10-CM | POA: Diagnosis not present

## 2015-09-06 DIAGNOSIS — G8921 Chronic pain due to trauma: Secondary | ICD-10-CM | POA: Diagnosis not present

## 2015-09-06 DIAGNOSIS — M25551 Pain in right hip: Secondary | ICD-10-CM | POA: Insufficient documentation

## 2015-09-06 DIAGNOSIS — M5417 Radiculopathy, lumbosacral region: Secondary | ICD-10-CM | POA: Insufficient documentation

## 2015-09-06 DIAGNOSIS — G8929 Other chronic pain: Secondary | ICD-10-CM

## 2015-09-06 DIAGNOSIS — G588 Other specified mononeuropathies: Secondary | ICD-10-CM | POA: Diagnosis not present

## 2015-09-06 DIAGNOSIS — Z5181 Encounter for therapeutic drug level monitoring: Secondary | ICD-10-CM

## 2015-09-06 MED ORDER — HYDROCODONE-ACETAMINOPHEN 10-325 MG PO TABS: 1.0000 | ORAL_TABLET | Freq: Four times a day (QID) | ORAL | Status: DC | PRN

## 2015-09-06 NOTE — Progress Notes (Signed)
Subjective:    Patient ID: Matthew Sloan, male    DOB: 1960-07-14, 55 y.o.   MRN: [id number]  HPI: [name] is a 55 year old male who returns for follow up for chronic pain and medication refill.He states his pain is located in his lower back radiating into his right hip also has generalized pain all over his body. He rates his pain 9. His current exercise regime is walking and performing stretching exercises using bands and balls daily.  Pain Inventory Average Pain 8 Pain Right Now 9 My pain is sharp, burning, stabbing, tingling and aching  In the last 24 hours, has pain interfered with the following? General activity 10 Relation with others 8 Enjoyment of life 8 What TIME of day is your pain at its worst? morning, daytime, evening, night Sleep (in general) Poor  Pain is worse with: walking, bending, sitting, inactivity and standing Pain improves with: NA Relief from Meds: 2  Mobility use a cane how many minutes can you walk? 10 ability to climb steps?  no do you drive?  yes Do you have any goals in this area?  yes  Function disabled: date disabled NA Do you have any goals in this area?  yes  Neuro/Psych weakness numbness tremor tingling spasms dizziness  Prior Studies Any changes since last visit?  no  Physicians involved in your care Any changes since last visit?  no   Family History  Problem Relation Age of Onset  . Cancer Mother 36    breast cancer  . Heart disease Father 64    CHF   Social History   Social History  . Marital Status: Single    Spouse Name: N/A  . Number of Children: N/A  . Years of Education: N/A   Social History Main Topics  . Smoking status: Current Every Day Smoker -- 1.00 packs/day for 30 years    Types: Cigarettes  . Smokeless tobacco: Never Used     Comment: working on it-down to 0.5 ppd  . Alcohol Use: No  . Drug Use: No  . Sexual Activity: Not Asked   Other Topics Concern  . None   Social History  Narrative   Past Surgical History  Procedure Laterality Date  . Shoulder surgery  2012    lt  . Multiple tooth extractions    . Ulnar tunnel release  12/15/2011    Procedure: CUBITAL TUNNEL RELEASE;  Surgeon: [name], MD;  Location: [facility];  Service: Orthopedics;  Laterality: Left;  left cubital tunnel release  . Carpal tunnel release  12/15/2011    Procedure: CARPAL TUNNEL RELEASE;  Surgeon: [name], MD;  Location: [facility];  Service: Orthopedics;  Laterality: Left;  . Bilateral tubes placed in ears  04/12/2012   Past Medical History  Diagnosis Date  . Lumbosacral neuritis   . Rotator cuff (capsule) sprain   . Disturbance of skin sensation   . Chronic pain due to trauma   . Intercostal neuralgia   . Allergy   . Snores   . Myocardial infarction (HCC)    BP 135/75 mmHg  Pulse 70  Resp 14  Opioid Risk Score:   Fall Risk Score:  `1  Depression screen PHQ 2/9  Depression screen [facility] 2/9 02/01/2015 09/28/2014  Decreased Interest 0 3  Down, Depressed, Hopeless 0 2  PHQ - 2 Score 0 5  Altered sleeping - 3  Tired, decreased energy - 3  Feeling bad or failure about yourself  - 3  Trouble concentrating - 2  Moving slowly or fidgety/restless - 2  Suicidal thoughts - 0  PHQ-9 Score - 18     Review of Systems  Neurological: Positive for tremors, weakness and numbness.       Tingling  Spasms   All other systems reviewed and are negative.      Objective:   Physical Exam  Constitutional: He is oriented to person, place, and time. He appears well-developed and well-nourished.  HENT:  Head: Normocephalic and atraumatic.  Neck: Normal range of motion. Neck supple.  Cardiovascular: Normal rate and regular rhythm.   Pulmonary/Chest: Effort normal and breath sounds normal.  Musculoskeletal:  Normal Muscle Bulk and Muscle Testing Reveals: Upper Extremities:Right: Full ROM and Muscle Strength 5/5 Left: Decreased ROM 90 Degrees and  Muscle Strength 5/5 Thoracic Paraspinal Tenderness: T-7- T-12 Lumbar Paraspinal Tenderness: L-3- L-5 Lower Extremities: Left Full ROM and Muscle Strength 5/5 Right: Decreased ROM and Muscle Strength 5/5 Right Lower Extremity Flexion Produces Pain into Hip and Lumbar Arises from chair slowly using straight cane for support Antalgic gait  Neurological: He is alert and oriented to person, place, and time.  Skin: Skin is warm and dry.  Psychiatric: He has a normal mood and affect.  Nursing note and vitals reviewed.         Assessment & Plan:  1. 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  Refilled: Hydrocodone 10/325 mg #120 pills--use every 6 hours as needed.  2. Intercostal neuralgia: Continue Robaxin and Neurontin  3. Insomnia: Continue trazodone.   15 minutes of face to face patient care time was spent during this visit. All questions were encouraged and answered.   F/U in 1 month

## 2015-10-04 ENCOUNTER — Encounter: Payer: Medicare HMO | Admitting: Registered Nurse

## 2015-10-11 ENCOUNTER — Encounter: Payer: Self-pay | Admitting: Registered Nurse

## 2015-10-11 ENCOUNTER — Encounter: Payer: Medicare HMO | Attending: Registered Nurse | Admitting: Registered Nurse

## 2015-10-11 VITALS — BP 130/65 | HR 79 | Resp 14

## 2015-10-11 DIAGNOSIS — Z5181 Encounter for therapeutic drug level monitoring: Secondary | ICD-10-CM | POA: Diagnosis not present

## 2015-10-11 DIAGNOSIS — Z760 Encounter for issue of repeat prescription: Secondary | ICD-10-CM | POA: Insufficient documentation

## 2015-10-11 DIAGNOSIS — M5417 Radiculopathy, lumbosacral region: Secondary | ICD-10-CM | POA: Diagnosis not present

## 2015-10-11 DIAGNOSIS — G894 Chronic pain syndrome: Secondary | ICD-10-CM

## 2015-10-11 DIAGNOSIS — G588 Other specified mononeuropathies: Secondary | ICD-10-CM | POA: Diagnosis not present

## 2015-10-11 DIAGNOSIS — F17210 Nicotine dependence, cigarettes, uncomplicated: Secondary | ICD-10-CM | POA: Diagnosis not present

## 2015-10-11 DIAGNOSIS — G8921 Chronic pain due to trauma: Secondary | ICD-10-CM | POA: Diagnosis not present

## 2015-10-11 DIAGNOSIS — R52 Pain, unspecified: Secondary | ICD-10-CM | POA: Diagnosis not present

## 2015-10-11 DIAGNOSIS — Z79899 Other long term (current) drug therapy: Secondary | ICD-10-CM

## 2015-10-11 DIAGNOSIS — M25551 Pain in right hip: Secondary | ICD-10-CM | POA: Insufficient documentation

## 2015-10-11 DIAGNOSIS — I252 Old myocardial infarction: Secondary | ICD-10-CM | POA: Diagnosis not present

## 2015-10-11 DIAGNOSIS — G8929 Other chronic pain: Secondary | ICD-10-CM

## 2015-10-11 DIAGNOSIS — M545 Low back pain: Secondary | ICD-10-CM | POA: Insufficient documentation

## 2015-10-11 DIAGNOSIS — M25552 Pain in left hip: Secondary | ICD-10-CM | POA: Diagnosis not present

## 2015-10-11 DIAGNOSIS — G4700 Insomnia, unspecified: Secondary | ICD-10-CM | POA: Insufficient documentation

## 2015-10-11 DIAGNOSIS — M5416 Radiculopathy, lumbar region: Secondary | ICD-10-CM

## 2015-10-11 DIAGNOSIS — Z79891 Long term (current) use of opiate analgesic: Secondary | ICD-10-CM | POA: Diagnosis not present

## 2015-10-11 DIAGNOSIS — R209 Unspecified disturbances of skin sensation: Secondary | ICD-10-CM | POA: Insufficient documentation

## 2015-10-11 MED ORDER — HYDROCODONE-ACETAMINOPHEN 10-325 MG PO TABS: 1.0000 | ORAL_TABLET | Freq: Four times a day (QID) | ORAL | Status: DC | PRN

## 2015-10-11 NOTE — Progress Notes (Signed)
Subjective:    Patient ID: Matthew Sloan, male    DOB: [birth year], 55 y.o.   MRN: [id number]  HPI: Matthew Sloan is a 55 year old male who returns for follow up for chronic pain and medication refill.He states his pain is located in his lower back radiating into his right hip also states he has generalized pain all over his body. States his right hip pain has intensified, he's going to make an appointment with Dr. Dion SaucierLandau.He rates his pain 9. His current exercise regime is walking and performing stretching exercises using bands and balls daily.   Pain Inventory Average Pain 8 Pain Right Now 9 My pain is constant, sharp, burning, dull, stabbing, tingling and aching  In the last 24 hours, has pain interfered with the following? General activity 10 Relation with others 8 Enjoyment of life 9 What TIME of day is your pain at its worst? all Sleep (in general) Poor  Pain is worse with: walking, bending, sitting, inactivity and standing Pain improves with: medication Relief from Meds: 2  Mobility walk with assistance use a cane ability to climb steps?  no do you drive?  yes Do you have any goals in this area?  yes  Function disabled: date disabled .  Neuro/Psych weakness numbness tremor tingling trouble walking spasms  Prior Studies Any changes since last visit?  no  Physicians involved in your care Any changes since last visit?  no   Family History  Problem Relation Age of Onset  . Cancer Mother 145    breast cancer  . Heart disease Father [phone]    CHF   Social History   Social History  . Marital Status: Single    Spouse Name: N/A  . Number of Children: N/A  . Years of Education: N/A   Social History Main Topics  . Smoking status: Current Every Day Smoker -- 1.00 packs/day for 30 years    Types: Cigarettes  . Smokeless tobacco: Never Used     Comment: working on it-down to 0.5 ppd  . Alcohol Use: No  . Drug Use: No  . Sexual Activity: Not Asked    Other Topics Concern  . None   Social History Narrative   Past Surgical History  Procedure Laterality Date  . Shoulder surgery  2012    lt  . Multiple tooth extractions    . Ulnar tunnel release  12/15/2011    Procedure: CUBITAL TUNNEL RELEASE;  Surgeon: Tami RibasKevin R Kuzma, MD;  Location: [facility];  Service: Orthopedics;  Laterality: Left;  left cubital tunnel release  . Carpal tunnel release  12/15/2011    Procedure: CARPAL TUNNEL RELEASE;  Surgeon: Tami RibasKevin R Kuzma, MD;  Location: [facility];  Service: Orthopedics;  Laterality: Left;  . Bilateral tubes placed in ears  04/12/2012   Past Medical History  Diagnosis Date  . Lumbosacral neuritis   . Rotator cuff (capsule) sprain   . Disturbance of skin sensation   . Chronic pain due to trauma   . Intercostal neuralgia   . Allergy   . Snores   . Myocardial infarction (HCC)    BP 130/65 mmHg  Pulse 79  Resp 14  SpO2 95%  Opioid Risk Score:   Fall Risk Score:  `1  Depression screen PHQ 2/9  [facility] 02/01/2015 09/28/2014  Decreased Interest 0 3  Down, Depressed, Hopeless 0 2  PHQ - 2 Score 0 5  Altered sleeping -  3  Tired, decreased energy - 3  Feeling bad or failure about yourself  - 3  Trouble concentrating - 2  Moving slowly or fidgety/restless - 2  Suicidal thoughts - 0  PHQ-9 Score - 18     Review of Systems  All other systems reviewed and are negative.      Objective:   Physical Exam  Constitutional: He is oriented to person, place, and time. He appears well-developed and well-nourished.  HENT:  Head: Normocephalic and atraumatic.  Neck: Normal range of motion. Neck supple.  Cardiovascular: Normal rate and regular rhythm.   Pulmonary/Chest: Effort normal and breath sounds normal.  Musculoskeletal:  Normal Muscle Bulk and Muscle Testing Reveals: Upper Extremities: Right: Full ROM and Muscle Strength 5/5 Left: Decreased ROM 45 Degrees and Muscle Strength  5/5 Thoracic Paraspinal Tenderness: T-7- T-9 Lumbar Hypersensitivity Lower Extremities: Right: Decreased ROM and Muscle Strength 5/5 Left: Full ROM ad Muscle Strength 5/5 Left Lower Extremity Flexion Produces Pain into Right Hip Right Lower Extremity Flexion Produces Pain into Lumbar and Right Hip Arises from chair slowlyy Using straight cane for support Antalgic gait  Neurological: He is alert and oriented to person, place, and time.  Skin: Skin is warm and dry.  Psychiatric: He has a normal mood and affect.  Nursing note and vitals reviewed.         Assessment & Plan:  1. 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  Refilled: Hydrocodone 10/325 mg #120 pills--use every 6 hours as needed.  2. Intercostal neuralgia: Continue Robaxin and Neurontin  3. Insomnia: Continue trazodone.   15 minutes of face to face patient care time was spent during this visit. All questions were encouraged and answered.   F/U in 1 month

## 2015-10-17 LAB — TOXASSURE SELECT,+ANTIDEPR,UR: PDF: 0

## 2015-10-21 NOTE — Progress Notes (Signed)
Urine drug screen for this encounter is consistent for prescribed medication 

## 2015-11-01 ENCOUNTER — Ambulatory Visit: Payer: Self-pay | Admitting: Physical Medicine & Rehabilitation

## 2015-11-08 ENCOUNTER — Encounter: Payer: Medicare HMO | Attending: Registered Nurse | Admitting: Registered Nurse

## 2015-11-08 ENCOUNTER — Encounter: Payer: Self-pay | Admitting: Registered Nurse

## 2015-11-08 VITALS — BP 132/63 | HR 69 | Resp 14

## 2015-11-08 DIAGNOSIS — Z79891 Long term (current) use of opiate analgesic: Secondary | ICD-10-CM | POA: Diagnosis not present

## 2015-11-08 DIAGNOSIS — M5417 Radiculopathy, lumbosacral region: Secondary | ICD-10-CM | POA: Insufficient documentation

## 2015-11-08 DIAGNOSIS — G4700 Insomnia, unspecified: Secondary | ICD-10-CM | POA: Insufficient documentation

## 2015-11-08 DIAGNOSIS — G894 Chronic pain syndrome: Secondary | ICD-10-CM

## 2015-11-08 DIAGNOSIS — M25552 Pain in left hip: Secondary | ICD-10-CM | POA: Insufficient documentation

## 2015-11-08 DIAGNOSIS — G588 Other specified mononeuropathies: Secondary | ICD-10-CM | POA: Insufficient documentation

## 2015-11-08 DIAGNOSIS — M25551 Pain in right hip: Secondary | ICD-10-CM | POA: Diagnosis not present

## 2015-11-08 DIAGNOSIS — R209 Unspecified disturbances of skin sensation: Secondary | ICD-10-CM | POA: Diagnosis not present

## 2015-11-08 DIAGNOSIS — F17210 Nicotine dependence, cigarettes, uncomplicated: Secondary | ICD-10-CM | POA: Insufficient documentation

## 2015-11-08 DIAGNOSIS — M545 Low back pain: Secondary | ICD-10-CM | POA: Diagnosis not present

## 2015-11-08 DIAGNOSIS — Z760 Encounter for issue of repeat prescription: Secondary | ICD-10-CM | POA: Insufficient documentation

## 2015-11-08 DIAGNOSIS — M5416 Radiculopathy, lumbar region: Secondary | ICD-10-CM

## 2015-11-08 DIAGNOSIS — I252 Old myocardial infarction: Secondary | ICD-10-CM | POA: Diagnosis not present

## 2015-11-08 DIAGNOSIS — Z79899 Other long term (current) drug therapy: Secondary | ICD-10-CM

## 2015-11-08 DIAGNOSIS — R52 Pain, unspecified: Secondary | ICD-10-CM | POA: Insufficient documentation

## 2015-11-08 DIAGNOSIS — G8921 Chronic pain due to trauma: Secondary | ICD-10-CM | POA: Insufficient documentation

## 2015-11-08 DIAGNOSIS — Z5181 Encounter for therapeutic drug level monitoring: Secondary | ICD-10-CM

## 2015-11-08 DIAGNOSIS — G8929 Other chronic pain: Secondary | ICD-10-CM

## 2015-11-08 MED ORDER — HYDROCODONE-ACETAMINOPHEN 10-325 MG PO TABS: 1.0000 | ORAL_TABLET | Freq: Four times a day (QID) | ORAL | Status: DC | PRN

## 2015-11-08 NOTE — Progress Notes (Signed)
Subjective:    Patient ID: Matthew Sloan, male    DOB: 14-Mar-1961, 55 y.o.   MRN: [id number]  HPI: Matthew Sloan is a 55 year old male who returns for follow up for chronic pain and medication refill.He states his pain is located in his lower back radiating into his bilateral hips right greater than left also states he has generalized pain all over his body. He rates his pain 9. His current exercise regime is walking and performing stretching exercises using bands and balls daily.  Pain Inventory Average Pain 9 Pain Right Now 9 My pain is constant, sharp, burning, stabbing, tingling and aching  In the last 24 hours, has pain interfered with the following? General activity 10 Relation with others 8 Enjoyment of life 8 What TIME of day is your pain at its worst? all Sleep (in general) Poor  Pain is worse with: walking, bending, sitting, inactivity and standing Pain improves with: medication Relief from Meds: 2  Mobility walk with assistance use a cane how many minutes can you walk? 5-10 ability to climb steps?  no do you drive?  yes Do you have any goals in this area?  yes  Function disabled: date disabled . Do you have any goals in this area?  yes  Neuro/Psych weakness numbness tremor tingling trouble walking spasms  Prior Studies Any changes since last visit?  no  Physicians involved in your care Any changes since last visit?  no   Family History  Problem Relation Age of Onset  . Cancer Mother [id number]    breast cancer  . Heart disease Father 772    CHF   Social History   Social History  . Marital Status: Single    Spouse Name: N/A  . Number of Children: N/A  . Years of Education: N/A   Social History Main Topics  . Smoking status: Current Every Day Smoker -- 1.00 packs/day for 30 years    Types: Cigarettes  . Smokeless tobacco: Never Used     Comment: working on it-down to 0.5 ppd  . Alcohol Use: No  . Drug Use: No  . Sexual Activity: Not Asked     Other Topics Concern  . None   Social History Narrative   Past Surgical History  Procedure Laterality Date  . Shoulder surgery  2012    lt  . Multiple tooth extractions    . Ulnar tunnel release  12/15/2011    Procedure: CUBITAL TUNNEL RELEASE;  Surgeon: Tami RibasKevin R Kuzma, MD;  Location: [facility];  Service: Orthopedics;  Laterality: Left;  left cubital tunnel release  . Carpal tunnel release  12/15/2011    Procedure: CARPAL TUNNEL RELEASE;  Surgeon: Tami RibasKevin R Kuzma, MD;  Location: [facility];  Service: Orthopedics;  Laterality: Left;  . Bilateral tubes placed in ears  04/12/2012   Past Medical History  Diagnosis Date  . Lumbosacral neuritis   . Rotator cuff (capsule) sprain   . Disturbance of skin sensation   . Chronic pain due to trauma   . Intercostal neuralgia   . Allergy   . Snores   . Myocardial infarction (HCC)    BP 132/63 mmHg  Pulse 69  Resp 14  SpO2 95%  Opioid Risk Score:   Fall Risk Score:  `1  Depression screen PHQ 2/9  [facility] 02/01/2015 09/28/2014  Decreased Interest 0 3  Down, Depressed, Hopeless 0 2  PHQ - 2 Score 0  5  Altered sleeping - 3  Tired, decreased energy - 3  Feeling bad or failure about yourself  - 3  Trouble concentrating - 2  Moving slowly or fidgety/restless - 2  Suicidal thoughts - 0  PHQ-9 Score - 18     Review of Systems  All other systems reviewed and are negative.      Objective:   Physical Exam  Constitutional: He is oriented to person, place, and time. He appears well-developed and well-nourished.  HENT:  Head: Normocephalic and atraumatic.  Neck: Normal range of motion. Neck supple.  Cardiovascular: Normal rate and regular rhythm.   Pulmonary/Chest: Effort normal and breath sounds normal.  Musculoskeletal:  Normal Muscle Bulk and Muscle Testing Reveals: Upper Extremities: Right:Full ROM and Muscle Strength 5/5 Left: Decreased ROM 45 Degrees and Muscle Strength  5/5 Thoracic Paraspinal Tenderness: T-7- T-9  Lumbar Hypersensitivity Right Greater Trochanteric Tenderness Lower Extremities: Right: Decreased ROM and Muscle Strength 4/5 Right Lower Extremity Flexion Produces Pain into Right Hip Left: Full ROM and Muscle Strength 5/5 Arises from chair slowly using straight cane for support Antalgic gait   Neurological: He is alert and oriented to person, place, and time.  Skin: Skin is warm and dry.  Psychiatric: He has a normal mood and affect.  Nursing note and vitals reviewed.         Assessment & Plan:  1. 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  Refilled: Hydrocodone 10/325 mg #120 pills--use every 6 hours as needed.  We will continue the opioid monitoring program, this consists of regular clinic visits, examinations, urine drug screen, pill counts as well as use of [place] Controlled Substance reporting System. 2. Intercostal neuralgia: Continue Robaxin and Neurontin  3. Insomnia: Continue trazodone.   15 minutes of face to face patient care time was spent during this visit. All questions were encouraged and answered.   F/U in 1 month

## 2015-12-06 ENCOUNTER — Ambulatory Visit (HOSPITAL_BASED_OUTPATIENT_CLINIC_OR_DEPARTMENT_OTHER): Payer: Medicare HMO | Admitting: Physical Medicine & Rehabilitation

## 2015-12-06 ENCOUNTER — Encounter: Payer: Medicare HMO | Attending: Physical Medicine & Rehabilitation

## 2015-12-06 ENCOUNTER — Encounter: Payer: Self-pay | Admitting: Physical Medicine & Rehabilitation

## 2015-12-06 VITALS — BP 144/78 | HR 70 | Resp 16

## 2015-12-06 DIAGNOSIS — G8921 Chronic pain due to trauma: Secondary | ICD-10-CM | POA: Insufficient documentation

## 2015-12-06 DIAGNOSIS — M5416 Radiculopathy, lumbar region: Secondary | ICD-10-CM | POA: Insufficient documentation

## 2015-12-06 DIAGNOSIS — G8929 Other chronic pain: Secondary | ICD-10-CM

## 2015-12-06 DIAGNOSIS — M25551 Pain in right hip: Secondary | ICD-10-CM

## 2015-12-06 DIAGNOSIS — M519 Unspecified thoracic, thoracolumbar and lumbosacral intervertebral disc disorder: Secondary | ICD-10-CM

## 2015-12-06 DIAGNOSIS — F17210 Nicotine dependence, cigarettes, uncomplicated: Secondary | ICD-10-CM | POA: Diagnosis not present

## 2015-12-06 DIAGNOSIS — M4647 Discitis, unspecified, lumbosacral region: Secondary | ICD-10-CM | POA: Diagnosis not present

## 2015-12-06 MED ORDER — TAPENTADOL HCL 75 MG PO TABS: 75.0000 mg | ORAL_TABLET | Freq: Four times a day (QID) | ORAL | Status: DC
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
## 2015-12-06 NOTE — Patient Instructions (Signed)
Will try nucynta this month We checked interaction and I found none

## 2016-01-03 ENCOUNTER — Encounter: Payer: Medicare HMO | Admitting: Registered Nurse

## 2016-01-16 ENCOUNTER — Encounter: Payer: Medicare HMO | Admitting: Registered Nurse

## 2016-01-22 ENCOUNTER — Encounter: Payer: Medicare HMO | Attending: Physical Medicine & Rehabilitation | Admitting: Registered Nurse

## 2016-01-22 ENCOUNTER — Encounter: Payer: Self-pay | Admitting: Registered Nurse

## 2016-01-22 VITALS — BP 145/83 | HR 75

## 2016-01-22 DIAGNOSIS — G894 Chronic pain syndrome: Secondary | ICD-10-CM

## 2016-01-22 DIAGNOSIS — M519 Unspecified thoracic, thoracolumbar and lumbosacral intervertebral disc disorder: Secondary | ICD-10-CM

## 2016-01-22 DIAGNOSIS — Z5181 Encounter for therapeutic drug level monitoring: Secondary | ICD-10-CM

## 2016-01-22 DIAGNOSIS — G8929 Other chronic pain: Secondary | ICD-10-CM | POA: Insufficient documentation

## 2016-01-22 DIAGNOSIS — M25551 Pain in right hip: Secondary | ICD-10-CM | POA: Diagnosis present

## 2016-01-22 DIAGNOSIS — Z79899 Other long term (current) drug therapy: Secondary | ICD-10-CM | POA: Diagnosis not present

## 2016-01-22 DIAGNOSIS — F17210 Nicotine dependence, cigarettes, uncomplicated: Secondary | ICD-10-CM | POA: Diagnosis not present

## 2016-01-22 DIAGNOSIS — M5416 Radiculopathy, lumbar region: Secondary | ICD-10-CM | POA: Insufficient documentation

## 2016-01-22 DIAGNOSIS — M4647 Discitis, unspecified, lumbosacral region: Secondary | ICD-10-CM | POA: Insufficient documentation

## 2016-01-22 DIAGNOSIS — G8921 Chronic pain due to trauma: Secondary | ICD-10-CM | POA: Insufficient documentation

## 2016-01-22 MED ORDER — HYDROCODONE-ACETAMINOPHEN 10-325 MG PO TABS: 1.0000 | ORAL_TABLET | Freq: Four times a day (QID) | ORAL | Status: DC | PRN

## 2016-01-22 NOTE — Progress Notes (Signed)
Subjective:    Patient ID: Matthew Sloan, male    DOB: Aug 27, 1960, 55 y.o.   MRN: [id number]  HPI: Matthew Sloan is a 55 year old male who returns for follow up for chronic pain and medication refill.He states his pain is located in his lower back radiating into his right hip, and right lower extremity. He rates his pain 9. Also states his pain has intensified since he was switch to Nucynta and he has been experiencing nausea, vomiting and insomnia. Nucynta discontinued and Hydrocodone ordered.  His current exercise regime is walking and performing stretching exercises using bands and balls daily.  Pain Inventory Average Pain 8 Pain Right Now 9 My pain is constant, sharp, burning, stabbing, tingling and aching  In the last 24 hours, has pain interfered with the following? General activity 8 Relation with others 8 Enjoyment of life 9 What TIME of day is your pain at its worst? all times Sleep (in general) Poor  Pain is worse with: walking, bending, sitting, inactivity and standing Pain improves with: . Relief from Meds: 2  Mobility use a cane how many minutes can you walk? 5 ability to climb steps?  no do you drive?  yes needs help with transfers transfers alone Do you have any goals in this area?  yes  Function disabled: date disabled . I need assistance with the following:  meal prep, household duties and shopping Do you have any goals in this area?  yes  Neuro/Psych weakness numbness trouble walking spasms  Prior Studies Any changes since last visit?  no  Physicians involved in your care Any changes since last visit?  no   Family History  Problem Relation Age of Onset  . Cancer Mother 42    breast cancer  . Heart disease Father 61    CHF   Social History   Social History  . Marital Status: Single    Spouse Name: N/A  . Number of Children: N/A  . Years of Education: N/A   Social History Main Topics  . Smoking status: Current Every Day Smoker --  1.00 packs/day for 30 years    Types: Cigarettes  . Smokeless tobacco: Never Used     Comment: working on it-down to 0.5 ppd  . Alcohol Use: No  . Drug Use: No  . Sexual Activity: Not Asked   Other Topics Concern  . None   Social History Narrative   Past Surgical History  Procedure Laterality Date  . Shoulder surgery  2012    lt  . Multiple tooth extractions    . Ulnar tunnel release  12/15/2011    Procedure: CUBITAL TUNNEL RELEASE;  Surgeon: [name], MD;  Location: [facility];  Service: Orthopedics;  Laterality: Left;  left cubital tunnel release  . Carpal tunnel release  12/15/2011    Procedure: CARPAL TUNNEL RELEASE;  Surgeon: [name], MD;  Location: [facility];  Service: Orthopedics;  Laterality: Left;  . Bilateral tubes placed in ears  04/12/2012   Past Medical History  Diagnosis Date  . Lumbosacral neuritis   . Rotator cuff (capsule) sprain   . Disturbance of skin sensation   . Chronic pain due to trauma   . Intercostal neuralgia   . Allergy   . Snores   . Myocardial infarction (HCC)    BP 167/80 mmHg  Pulse 74  SpO2 96%  Opioid Risk Score:   Fall Risk Score:  `1  Depression screen PHQ 2/9  Depression screen [facility] 2/9 02/01/2015 09/28/2014  Decreased Interest 0 3  Down, Depressed, Hopeless 0 2  PHQ - 2 Score 0 5  Altered sleeping - 3  Tired, decreased energy - 3  Feeling bad or failure about yourself  - 3  Trouble concentrating - 2  Moving slowly or fidgety/restless - 2  Suicidal thoughts - 0  PHQ-9 Score - 18     Review of Systems  Constitutional: Negative.   HENT: Negative.   Eyes: Negative.   Respiratory: Negative.   Cardiovascular: Negative.   Gastrointestinal: Negative.   Endocrine: Negative.   Genitourinary: Negative.   Musculoskeletal: Positive for back pain.  Skin: Negative.   Allergic/Immunologic: Negative.   Neurological: Negative.   Hematological: Negative.   Psychiatric/Behavioral: Negative.         Objective:   Physical Exam  Constitutional: He is oriented to person, place, and time. He appears well-developed and well-nourished.  HENT:  Head: Normocephalic and atraumatic.  Neck: Normal range of motion. Neck supple.  Cardiovascular: Normal rate and regular rhythm.   Pulmonary/Chest: Effort normal and breath sounds normal.  Musculoskeletal:  Normal Muscle Bulk and Muscle Testing Reveals: Upper Extremities: Right: Full ROM and Muscle Strength 5/5 Left: Decreased ROM 45 Degrees and Muscle Strength 5/5 Left AC Joint Tenderness Thoracic Paraspinal Tenderness: T-1- T-4 Mainly Left Side Right Greater Trochanteric Tenderness Lower Extremities: Left: Full ROM and Muscle Strength 5/5 Arises from Table slowly using straight cane for support Antalgic Gait  Neurological: He is alert and oriented to person, place, and time.  Skin: Skin is warm and dry.  Psychiatric: He has a normal mood and affect.  Nursing note and vitals reviewed.         Assessment & Plan:  1. 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  Refilled: Hydrocodone 10/325 mg #120 pills--use every 6 hours as needed.  We will continue the opioid monitoring program, this consists of regular clinic visits, examinations, urine drug screen, pill counts as well as use of [place] Cont[place]. 2. Intercostal neuralgia: Continue Robaxin and Neurontin  3. Insomnia: Continue trazodone.   15 minutes of face to face patient care time was spent during this visit. All questions were encouraged and answered.   F/U in 1 month

## 2016-01-31 LAB — TOXASSURE SELECT,+ANTIDEPR,UR: PDF: 0

## 2016-02-04 NOTE — Progress Notes (Signed)
Urine drug screen for this encounter is consistent for prescribed medications.   

## 2016-02-14 ENCOUNTER — Ambulatory Visit: Payer: Self-pay | Admitting: Registered Nurse

## 2016-02-20 ENCOUNTER — Encounter: Payer: Self-pay | Admitting: Registered Nurse

## 2016-02-20 ENCOUNTER — Encounter: Payer: Medicare HMO | Attending: Physical Medicine & Rehabilitation | Admitting: Registered Nurse

## 2016-02-20 VITALS — BP 153/93 | HR 75 | Resp 16

## 2016-02-20 DIAGNOSIS — Z79899 Other long term (current) drug therapy: Secondary | ICD-10-CM

## 2016-02-20 DIAGNOSIS — G8921 Chronic pain due to trauma: Secondary | ICD-10-CM

## 2016-02-20 DIAGNOSIS — G8929 Other chronic pain: Secondary | ICD-10-CM | POA: Diagnosis not present

## 2016-02-20 DIAGNOSIS — F17210 Nicotine dependence, cigarettes, uncomplicated: Secondary | ICD-10-CM | POA: Diagnosis not present

## 2016-02-20 DIAGNOSIS — G894 Chronic pain syndrome: Secondary | ICD-10-CM

## 2016-02-20 DIAGNOSIS — M4647 Discitis, unspecified, lumbosacral region: Secondary | ICD-10-CM

## 2016-02-20 DIAGNOSIS — M519 Unspecified thoracic, thoracolumbar and lumbosacral intervertebral disc disorder: Secondary | ICD-10-CM

## 2016-02-20 DIAGNOSIS — Z5181 Encounter for therapeutic drug level monitoring: Secondary | ICD-10-CM

## 2016-02-20 DIAGNOSIS — M25551 Pain in right hip: Secondary | ICD-10-CM | POA: Diagnosis present

## 2016-02-20 DIAGNOSIS — M5416 Radiculopathy, lumbar region: Secondary | ICD-10-CM | POA: Diagnosis present

## 2016-02-20 MED ORDER — HYDROCODONE-ACETAMINOPHEN 10-325 MG PO TABS: 1.0000 | ORAL_TABLET | Freq: Four times a day (QID) | ORAL | 0 refills | Status: DC | PRN

## 2016-02-20 NOTE — Progress Notes (Signed)
Subjective:    Patient ID: Matthew Sloan, male    DOB: 1961/01/28, 55 y.o.   MRN: [id number]  HPI: Matthew Sloan is a 55 year old male who returns for follow up for chronic pain and medication refill.He states his pain is located in his lower back radiating into his right hip, and right lower extremity. He rates his pain 9. Also states he has generalized pain all over. His current exercise regime is walking and performing stretching exercises using bands and balls daily.  Pain Inventory Average Pain 8 Pain Right Now 9 My pain is sharp, burning, stabbing, tingling and aching  In the last 24 hours, has pain interfered with the following? General activity 9 Relation with others 8 Enjoyment of life 8 What TIME of day is your pain at its worst? all Sleep (in general) Poor  Pain is worse with: walking, bending, sitting, inactivity and standing Pain improves with: medication Relief from Meds: 3  Mobility use a cane  Function disabled: date disabled .  Neuro/Psych weakness numbness tremor tingling trouble walking spasms  Prior Studies Any changes since last visit?  no  Physicians involved in your care Any changes since last visit?  no   Family History  Problem Relation Age of Onset  . Cancer Mother 5745    breast cancer  . Heart disease Father [phone]    CHF   Social History   Social History  . Marital status: Single    Spouse name: N/A  . Number of children: N/A  . Years of education: N/A   Social History Main Topics  . Smoking status: Current Every Day Smoker    Packs/day: 1.00    Years: 30.00    Types: Cigarettes  . Smokeless tobacco: Never Used     Comment: working on it-down to 0.5 ppd  . Alcohol use No  . Drug use: No  . Sexual activity: Not Asked   Other Topics Concern  . None   Social History Narrative  . None   Past Surgical History:  Procedure Laterality Date  . bilateral tubes placed in ears  04/12/2012  . CARPAL TUNNEL RELEASE  12/15/2011    Procedure: CARPAL TUNNEL RELEASE;  Surgeon: Tami RibasKevin R Kuzma, MD;  Location: [facility];  Service: Orthopedics;  Laterality: Left;  [name]. MULTIPLE TOOTH EXTRACTIONS    . SHOULDER SURGERY  2012   lt  . ULNAR TUNNEL RELEASE  12/15/2011   Procedure: CUBITAL TUNNEL RELEASE;  Surgeon: Tami RibasKevin R Kuzma, MD;  [facility];  Service: Orthopedics;  Laterality: Left;  left cubital tunnel release   Past Medical History:  Diagnosis Date  . Allergy   . Chronic pain due to trauma   . Disturbance of skin sensation   . Intercostal neuralgia   . Lumbosacral neuritis   . Myocardial infarction (HCC)   . Rotator cuff (capsule) sprain   . Snores    BP (!) 153/93 (BP Location: Left Arm, Patient Position: Sitting, Cuff Size: Normal)   Pulse 75   Resp 16   SpO2 93%   Opioid Risk Score:   Fall Risk Score:  `1  Depression screen PHQ 2/9  Depression screen [facility] 2/[facility] 02/20/2016 02/01/2015 09/28/2014  Decreased Interest 0 0 3  Down, Depressed, Hopeless 0 0 2  PHQ - 2 Score 0 0 5  Altered sleeping - - 3  Tired, decreased energy - - 3  Feeling bad or failure about yourself  - -  3  Trouble concentrating - - 2  Moving slowly or fidgety/restless - - 2  Suicidal thoughts - - 0  PHQ-9 Score - - 18   Review of Systems  All other systems reviewed and are negative.      Objective:   Physical Exam  Constitutional: He is oriented to person, place, and time. He appears well-developed and well-nourished.  HENT:  Head: Normocephalic and atraumatic.  Neck: Normal range of motion. Neck supple.  Cardiovascular: Normal rate and regular rhythm.   Pulmonary/Chest: Effort normal and breath sounds normal.  Musculoskeletal:  Normal Muscle Bulk and Muscle Testing Reveals: Upper Extremities: Right: Full ROM and Muscle Strength 5/5 Left: Decreased ROM 90 Degrees and Muscle Strength 5/5 Lumbar Paraspinal Tenderness: L-3- L-5 Right Greater Trochanteric tenderness Lower Extremities: Right  Decreased ROM and Muscle Strength 5/5 Left: Full ROM and Muscle Strength 5/5 Arises from chair slowly Narrow Based Gait  Neurological: He is alert and oriented to person, place, and time.  Skin: Skin is warm and dry.  Psychiatric: He has a normal mood and affect.  Nursing note and vitals reviewed.         Assessment & Plan:  1. 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  Refilled: Hydrocodone 10/325 mg #120 pills--use every 6 hours as needed.  We will continue the opioid monitoring program, this consists of regular clinic visits, examinations, urine drug screen, pill counts as well as use of [place] Controlled Substance reporting System. 2. Intercostal neuralgia: Continue Robaxin and Neurontin  3. Insomnia: Continue trazodone.   15 minutes of face to face patient care time was spent during this visit. All questions were encouraged and answered.   F/U in 1 month

## 2016-03-20 ENCOUNTER — Encounter: Payer: Medicare HMO | Admitting: Registered Nurse

## 2016-03-23 ENCOUNTER — Encounter: Payer: Medicare HMO | Attending: Physical Medicine & Rehabilitation | Admitting: Registered Nurse

## 2016-03-23 ENCOUNTER — Encounter: Payer: Self-pay | Admitting: Registered Nurse

## 2016-03-23 VITALS — BP 142/75 | HR 66 | Resp 16

## 2016-03-23 DIAGNOSIS — M25551 Pain in right hip: Secondary | ICD-10-CM | POA: Diagnosis present

## 2016-03-23 DIAGNOSIS — M5416 Radiculopathy, lumbar region: Secondary | ICD-10-CM | POA: Diagnosis present

## 2016-03-23 DIAGNOSIS — G8921 Chronic pain due to trauma: Secondary | ICD-10-CM | POA: Diagnosis not present

## 2016-03-23 DIAGNOSIS — Z79899 Other long term (current) drug therapy: Secondary | ICD-10-CM

## 2016-03-23 DIAGNOSIS — G8929 Other chronic pain: Secondary | ICD-10-CM

## 2016-03-23 DIAGNOSIS — F17210 Nicotine dependence, cigarettes, uncomplicated: Secondary | ICD-10-CM | POA: Diagnosis not present

## 2016-03-23 DIAGNOSIS — G894 Chronic pain syndrome: Secondary | ICD-10-CM

## 2016-03-23 DIAGNOSIS — M519 Unspecified thoracic, thoracolumbar and lumbosacral intervertebral disc disorder: Secondary | ICD-10-CM

## 2016-03-23 DIAGNOSIS — Z5181 Encounter for therapeutic drug level monitoring: Secondary | ICD-10-CM

## 2016-03-23 DIAGNOSIS — M4647 Discitis, unspecified, lumbosacral region: Secondary | ICD-10-CM | POA: Insufficient documentation

## 2016-03-23 MED ORDER — HYDROCODONE-ACETAMINOPHEN 10-325 MG PO TABS: 1.0000 | ORAL_TABLET | Freq: Four times a day (QID) | ORAL | 0 refills | Status: DC | PRN

## 2016-03-23 NOTE — Progress Notes (Signed)
Subjective:    Patient ID: Matthew Sloan, male    DOB: [birth year], 55 y.o.   MRN: [id number]  HPI: Matthew Sloan is a 55 year old male who returns for follow up for chronic pain and medication refill.He states his pain is located in his lower back radiating into his right hip, and right lower extremity. He rates his pain 9. His current exercise regime is walking and performing stretching exercises using bands and balls daily.  Pain Inventory Average Pain 8 Pain Right Now 9 My pain is constant  In the last 24 hours, has pain interfered with the following? General activity 10 Relation with others 8 Enjoyment of life 8 What TIME of day is your pain at its worst? all Sleep (in general) Poor  Pain is worse with: walking, bending, sitting, inactivity and standing Pain improves with: medication Relief from Meds: 3  Mobility use a cane how many minutes can you walk? 5 ability to climb steps?  no do you drive?  yes  Function disabled: date disabled .  Neuro/Psych weakness numbness tremor tingling trouble walking spasms  Prior Studies Any changes since last visit?  no  Physicians involved in your care Any changes since last visit?  no   Family History  Problem Relation Age of Onset  . Cancer Mother 7145    breast cancer  . Heart disease Father 5672    CHF   Social History   Social History  . Marital status: Single    Spouse name: N/A  . Number of children: N/A  . Years of education: N/A   Social History Main Topics  . Smoking status: Current Every Day Smoker    Packs/day: 1.00    Years: 30.00    Types: Cigarettes  . Smokeless tobacco: Never Used     Comment: working on it-down to 0.5 ppd  . Alcohol use No  . Drug use: No  . Sexual activity: Not Asked   Other Topics Concern  . None   Social History Narrative  . None   Past Surgical History:  Procedure Laterality Date  . bilateral tubes placed in ears  04/12/2012  . CARPAL TUNNEL RELEASE   12/15/2011   Procedure: CARPAL TUNNEL RELEASE;  Surgeon: Tami RibasKevin R Kuzma, MD;  Location: [facility];  Service: Orthopedics;  Laterality: Left;  [name]. MULTIPLE TOOTH EXTRACTIONS    . SHOULDER SURGERY  2012   lt  . ULNAR TUNNEL RELEASE  12/15/2011   Procedure: CUBITAL TUNNEL RELEASE;  Surgeon: Tami RibasKevin R Kuzma, MD;  Location: [facility];  Service: Orthopedics;  Laterality: Left;  left cubital tunnel release   Past Medical History:  Diagnosis Date  . Allergy   . Chronic pain due to trauma   . Disturbance of skin sensation   . Intercostal neuralgia   . Lumbosacral neuritis   . Myocardial infarction (HCC)   . Rotator cuff (capsule) sprain   . Snores    BP (!) 142/75 (BP Location: Left Arm, Patient Position: Sitting, Cuff Size: Large)   Pulse 66   Resp 16   SpO2 94%   Opioid Risk Score:   Fall Risk Score:  `1  Depression screen PHQ 2/9  Depression screen [facility] 2/9 03/23/2016 02/20/2016 02/01/2015 09/28/2014  Decreased Interest 0 0 0 3  Down, Depressed, Hopeless 0 0 0 2  PHQ - 2 Score 0 0 0 5  Altered sleeping - - - 3  Tired, decreased energy - - -  3  Feeling bad or failure about yourself  - - - 3  Trouble concentrating - - - 2  Moving slowly or fidgety/restless - - - 2  Suicidal thoughts - - - 0  PHQ-9 Score - - - 18    Review of Systems  All other systems reviewed and are negative.      Objective:   Physical Exam  Constitutional: He is oriented to person, place, and time. He appears well-developed and well-nourished.  HENT:  Head: Normocephalic and atraumatic.  Neck: Normal range of motion. Neck supple.  Cardiovascular: Normal rate and regular rhythm.   Pulmonary/Chest: Effort normal and breath sounds normal.  Musculoskeletal:  Normal Muscle Bulk and Muscle Testing Reveals: Upper Extremities: Right: Full ROM and  Muscle Strength 5/5 Left: Decreased ROM 90 Degrees  And Muscle Strength 5/5 Lumbar Paraspinal Tenderness: L-3- L-5 Right Greater  Trochanteric Tenderness Left Lower Extremity Flexion Produces Pain into Lumbar Right Lower Extremity Flexion Produces Pain into Lumbar and Hip Arises from chair slowly using straight cane for support Wide Based Gait   Neurological: He is alert and oriented to person, place, and time.  Skin: Skin is warm and dry.  Psychiatric: He has a normal mood and affect.  Nursing note and vitals reviewed.         Assessment & Plan:  1. 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  Refilled:Hydrocodone 10/325 mg #120 pills--use every 6 hours as needed.  We will continue the opioid monitoring program, this consists of regular clinic visits, examinations, urine drug screen, pill counts as well as use of [place] Controlled Substance reporting System. 2. Intercostal neuralgia: Continue Robaxin and Neurontin  3. Insomnia: Continue trazodone.   15 minutes of face to face patient care time was spent during this visit. All questions were encouraged and answered.   F/U in 1 month

## 2016-04-15 ENCOUNTER — Encounter: Payer: Medicare HMO | Admitting: Registered Nurse

## 2016-04-17 ENCOUNTER — Encounter: Payer: Medicare HMO | Attending: Physical Medicine & Rehabilitation | Admitting: Registered Nurse

## 2016-04-17 ENCOUNTER — Encounter: Payer: Self-pay | Admitting: Registered Nurse

## 2016-04-17 VITALS — BP 130/79 | HR 89

## 2016-04-17 DIAGNOSIS — G8921 Chronic pain due to trauma: Secondary | ICD-10-CM | POA: Insufficient documentation

## 2016-04-17 DIAGNOSIS — Z5181 Encounter for therapeutic drug level monitoring: Secondary | ICD-10-CM

## 2016-04-17 DIAGNOSIS — M519 Unspecified thoracic, thoracolumbar and lumbosacral intervertebral disc disorder: Secondary | ICD-10-CM | POA: Diagnosis not present

## 2016-04-17 DIAGNOSIS — G8929 Other chronic pain: Secondary | ICD-10-CM | POA: Diagnosis not present

## 2016-04-17 DIAGNOSIS — Z79899 Other long term (current) drug therapy: Secondary | ICD-10-CM

## 2016-04-17 DIAGNOSIS — M5416 Radiculopathy, lumbar region: Secondary | ICD-10-CM | POA: Diagnosis not present

## 2016-04-17 DIAGNOSIS — M4647 Discitis, unspecified, lumbosacral region: Secondary | ICD-10-CM | POA: Diagnosis not present

## 2016-04-17 DIAGNOSIS — F17210 Nicotine dependence, cigarettes, uncomplicated: Secondary | ICD-10-CM | POA: Diagnosis not present

## 2016-04-17 DIAGNOSIS — M25551 Pain in right hip: Secondary | ICD-10-CM | POA: Diagnosis present

## 2016-04-17 DIAGNOSIS — G894 Chronic pain syndrome: Secondary | ICD-10-CM

## 2016-04-17 DIAGNOSIS — R0781 Pleurodynia: Secondary | ICD-10-CM

## 2016-04-17 MED ORDER — HYDROCODONE-ACETAMINOPHEN 10-325 MG PO TABS: 1.0000 | ORAL_TABLET | Freq: Four times a day (QID) | ORAL | 0 refills | Status: DC | PRN

## 2016-04-17 NOTE — Progress Notes (Signed)
Subjective:    Patient ID: Matthew Sloan, male    DOB: August 23, 1960, 55 y.o.   MRN: [id number]  HPI: Matthew Sloan is a 55 year old male who returns for follow up for chronic pain and medication refill.He states his pain is located in his right rib pain,lower back radiating into his right hip andright groin, and right lower extremity. He rates his pain 9. His current exercise regime is walking and performing stretching exercises using bands and balls daily.  Pain Inventory Average Pain 8 Pain Right Now 9 My pain is constant, sharp, burning, stabbing, tingling and aching  In the last 24 hours, has pain interfered with the following? General activity 9 Relation with others 9 Enjoyment of life 9 What TIME of day is your pain at its worst? all times Sleep (in general) Poor  Pain is worse with: walking, bending, sitting, inactivity and standing Pain improves with: rest and medication Relief from Meds: 2  Mobility use a cane Do you have any goals in this area?  yes  Function disabled: date disabled . Do you have any goals in this area?  yes  Neuro/Psych weakness tingling trouble walking spasms  Prior Studies Any changes since last visit?  no  Physicians involved in your care Any changes since last visit?  no   Family History  Problem Relation Age of Onset  . Cancer Mother 2945    breast cancer  . Heart disease Father 7472    CHF   Social History   Social History  . Marital status: Single    Spouse name: N/A  . Number of children: N/A  . Years of education: N/A   Social History Main Topics  . Smoking status: Current Every Day Smoker    Packs/day: 1.00    Years: 30.00    Types: Cigarettes  . Smokeless tobacco: Never Used     Comment: working on it-down to 0.5 ppd  . Alcohol use No  . Drug use: No  . Sexual activity: Not Asked   Other Topics Concern  . None   Social History Narrative  . None   Past Surgical History:  Procedure Laterality Date  .  bilateral tubes placed in ears  04/12/2012  . CARPAL TUNNEL RELEASE  12/15/2011   Procedure: CARPAL TUNNEL RELEASE;  Surgeon: Tami RibasKevin R Kuzma, MD;  Location: [facility];  Service: Orthopedics;  Laterality: Left;  [name]. MULTIPLE TOOTH EXTRACTIONS    . SHOULDER SURGERY  2012   lt  . ULNAR TUNNEL RELEASE  12/15/2011   Procedure: CUBITAL TUNNEL RELEASE;  Surgeon: Tami RibasKevin R Kuzma, MD;  Location: [facility];  Service: Orthopedics;  Laterality: Left;  left cubital tunnel release   Past Medical History:  Diagnosis Date  . Allergy   . Chronic pain due to trauma   . Disturbance of skin sensation   . Intercostal neuralgia   . Lumbosacral neuritis   . Myocardial infarction   . Rotator cuff (capsule) sprain   . Snores    BP 130/79   Pulse 89   SpO2 92%   Opioid Risk Score:   Fall Risk Score:  `1  Depression screen PHQ 2/9  [facility] 03/23/2016 02/20/2016 02/01/2015 09/28/2014  Decreased Interest 0 0 0 3  Down, Depressed, Hopeless 0 0 0 2  PHQ - 2 Score 0 0 0 5  Altered sleeping - - - 3  Tired, decreased energy - - - 3  Feeling bad or failure about yourself  - - - 3  Trouble concentrating - - - 2  Moving slowly or fidgety/restless - - - 2  Suicidal thoughts - - - 0  PHQ-9 Score - - - 18    Review of Systems  HENT: Negative.   Eyes: Negative.   Respiratory: Negative.   Cardiovascular: Negative.   Gastrointestinal: Negative.   Endocrine: Negative.   Genitourinary: Negative.   Musculoskeletal: Positive for back pain.  Skin: Negative.   Allergic/Immunologic: Negative.   Neurological: Positive for weakness and numbness.  Hematological: Negative.   Psychiatric/Behavioral: Negative.        Objective:   Physical Exam  Constitutional: He is oriented to person, place, and time. He appears well-developed and well-nourished.  HENT:  Head: Normocephalic and atraumatic.  Neck: Normal range of motion. Neck supple.  Cardiovascular: Normal rate and  regular rhythm.   Pulmonary/Chest: Effort normal and breath sounds normal.  Musculoskeletal:  Normal Muscle Bulk and Muscle Testing Reveals: Upper Extremities: Right: Full ROM and Muscle Strength 5/5 Left: Decreased ROM 45 Degrees and Muscle Strength 5/5 Thoracic Paraspinal Tenderness: T-9- T-12 Lumbar Hypersensitivity Lower Extremities: Right: Decreased ROM and Muscle Strength 4/5 Left: Full ROM and Muscle Strength 5/5 Arises from chair slowly using straight cane for support Narrow Based Gait  Neurological: He is alert and oriented to person, place, and time.  Skin: Skin is warm and dry.  Psychiatric: He has a normal mood and affect.  Nursing note and vitals reviewed.         Assessment & Plan:  1. 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  Refilled:Hydrocodone 10/325 mg #120 pills--use every 6 hours as needed.  We will continue the opioid monitoring program, this consists of regular clinic visits, examinations, urine drug screen, pill counts as well as use of [place] Controlled Substance reporting System. 2. Intercostal neuralgia: Continue Robaxin and Neurontin  3. Insomnia: Continue trazodone.   15 minutes of face to face patient care time was spent during this visit. All questions were encouraged and answered.   F/U in 1 month

## 2016-05-15 ENCOUNTER — Ambulatory Visit (HOSPITAL_BASED_OUTPATIENT_CLINIC_OR_DEPARTMENT_OTHER): Payer: Medicare HMO | Admitting: Physical Medicine & Rehabilitation

## 2016-05-15 ENCOUNTER — Encounter: Payer: Self-pay | Admitting: Physical Medicine & Rehabilitation

## 2016-05-15 ENCOUNTER — Encounter: Payer: Medicare HMO | Attending: Physical Medicine & Rehabilitation

## 2016-05-15 VITALS — BP 146/75 | HR 81 | Resp 14

## 2016-05-15 DIAGNOSIS — M25551 Pain in right hip: Secondary | ICD-10-CM | POA: Diagnosis not present

## 2016-05-15 DIAGNOSIS — G8929 Other chronic pain: Secondary | ICD-10-CM | POA: Diagnosis present

## 2016-05-15 DIAGNOSIS — G8921 Chronic pain due to trauma: Secondary | ICD-10-CM

## 2016-05-15 DIAGNOSIS — M4647 Discitis, unspecified, lumbosacral region: Secondary | ICD-10-CM | POA: Insufficient documentation

## 2016-05-15 DIAGNOSIS — M5416 Radiculopathy, lumbar region: Secondary | ICD-10-CM | POA: Insufficient documentation

## 2016-05-15 DIAGNOSIS — F17210 Nicotine dependence, cigarettes, uncomplicated: Secondary | ICD-10-CM | POA: Diagnosis not present

## 2016-05-15 MED ORDER — HYDROCODONE-ACETAMINOPHEN 10-325 MG PO TABS: 1.0000 | ORAL_TABLET | Freq: Four times a day (QID) | ORAL | 0 refills | Status: DC | PRN

## 2016-05-15 MED ORDER — METHOCARBAMOL 500 MG PO TABS: 500.0000 mg | ORAL_TABLET | Freq: Three times a day (TID) | ORAL | 3 refills | Status: DC

## 2016-05-15 MED ORDER — GABAPENTIN 800 MG PO TABS: 800.0000 mg | ORAL_TABLET | Freq: Three times a day (TID) | ORAL | 3 refills | Status: DC

## 2016-05-15 MED ORDER — TRAZODONE HCL 100 MG PO TABS: ORAL_TABLET | ORAL | 2 refills | Status: DC

## 2016-05-15 NOTE — Progress Notes (Signed)
Subjective:    Patient ID: Matthew Sloan, male    DOB: [birth year], 55 y.o.   MRN: [id number]  HPI He has no significant side effects from the hydrocodone   He is independent with his self-care and mobility he continues to ambulate with a cane. His adjuvant medications including gabapentin 800 mg 3 times a day as well as methocarbamol 500 mg when necessary  Patient tried Nucynta 75 mg 4 times per day, but this was not very helpful and it causes some stomach upset. Also, the out-of-pocket expense was about $700 for a month. Pain Inventory Average Pain 8 Pain Right Now 9 My pain is constant, sharp, burning, stabbing, tingling and aching  In the last 24 hours, has pain interfered with the following? General activity 9 Relation with others 8 Enjoyment of life 8 What TIME of day is your pain at its worst? all Sleep (in general) Poor  Pain is worse with: walking, bending, sitting, inactivity and standing Pain improves with: n/a Relief from Meds: 3  Mobility use a cane ability to climb steps?  no do you drive?  yes Do you have any goals in this area?  yes  Function disabled: date disabled n/a Do you have any goals in this area?  yes  Neuro/Psych weakness numbness tingling trouble walking  Prior Studies Any changes since last visit?  no  Physicians involved in your care Any changes since last visit?  no   Family History  Problem Relation Age of Onset  . Cancer Mother 845    breast cancer  . Heart disease Father 6572    CHF   Social History   Social History  . Marital status: Single    Spouse name: N/A  . Number of children: N/A  . Years of education: N/A   Social History Main Topics  . Smoking status: Current Every Day Smoker    Packs/day: 1.00    Years: 30.00    Types: Cigarettes  . Smokeless tobacco: Never Used     Comment: working on it-down to 0.5 ppd  . Alcohol use No  . Drug use: No  . Sexual activity: Not Asked   Other Topics Concern  . None     Social History Narrative  . None   Past Surgical History:  Procedure Laterality Date  . bilateral tubes placed in ears  04/12/2012  . CARPAL TUNNEL RELEASE  12/15/2011   Procedure: CARPAL TUNNEL RELEASE;  Surgeon: Tami RibasKevin R Kuzma, MD;  Location: [facility];  Service: Orthopedics;  Laterality: Left;  [name]. MULTIPLE TOOTH EXTRACTIONS    . SHOULDER SURGERY  2012   lt  . ULNAR TUNNEL RELEASE  12/15/2011   Procedure: CUBITAL TUNNEL RELEASE;  Surgeon: Tami RibasKevin R Kuzma, MD;  Location: [facility];  Service: Orthopedics;  Laterality: Left;  left cubital tunnel release   Past Medical History:  Diagnosis Date  . Allergy   . Chronic pain due to trauma   . Disturbance of skin sensation   . Intercostal neuralgia   . Lumbosacral neuritis   . Myocardial infarction   . Rotator cuff (capsule) sprain   . Snores    BP (!) 146/75   Pulse 81   Resp 14   SpO2 94%   Opioid Risk Score:   Fall Risk Score:  `1  Depression screen PHQ 2/9  [facility] 03/23/2016 02/20/2016 02/01/2015 09/28/2014  Decreased Interest 0 0 0 3  Down, Depressed, Hopeless 0 0  0 2  PHQ - 2 Score 0 0 0 5  Altered sleeping - - - 3  Tired, decreased energy - - - 3  Feeling bad or failure about yourself  - - - 3  Trouble concentrating - - - 2  Moving slowly or fidgety/restless - - - 2  Suicidal thoughts - - - 0  PHQ-9 Score - - - 18   Review of Systems  All other systems reviewed and are negative.      Objective:   Physical Exam  Constitutional: He is oriented to person, place, and time. He appears well-developed and well-nourished.  HENT:  Head: Normocephalic and atraumatic.  Eyes: Conjunctivae and EOM are normal. Pupils are equal, round, and reactive to light.  Neurological: He is alert and oriented to person, place, and time. He exhibits normal muscle tone. Gait abnormal.  Reflex Scores:      Patellar reflexes are 2+ on the right side and 2+ on the left side.      Achilles  reflexes are 1+ on the right side and 1+ on the left side. He relates to a straight cane, antalgic gait favoring right lower extremity  Psychiatric: Judgment and thought content normal. His mood appears anxious. His speech is rapid and/or pressured. He is hyperactive. Cognition and memory are normal.  Nursing note and vitals reviewed.  Pain with right hip internal/external rotation mildly diminished range of motion       Assessment & Plan:  1. Chronic pain related trauma history of right hip dislocation as well as chronic lumbosacral radiculopathy. Patient has had multiple spine fractures associated with the trauma in 2010 May be developing posttraumatic arthritis, recommend right hip x-ray , last x-ray was in 2011, which showed no evidence of osteoarthritis, however, that was only one year post trauma Continue gabapentin 800 mg 1-2 times a day   hydrocodone 10/325mg  4 times a day Continue Flector patch when needed 1-2 per week Continue methocarbamol 500 mg 2 times a day as needed  Trazodone 100 mg, takes this 1-2 times per week   Return to clinic one month with nurse practitioner X-ray shows evidence arthritis, would recommend fluoroscopic guidance, a hip injection M.D. Follow-up in 6 months .

## 2016-06-12 ENCOUNTER — Encounter: Payer: Self-pay | Admitting: Registered Nurse

## 2016-06-12 ENCOUNTER — Encounter: Payer: Medicare HMO | Attending: Physical Medicine & Rehabilitation | Admitting: Registered Nurse

## 2016-06-12 VITALS — BP 145/81 | HR 75

## 2016-06-12 DIAGNOSIS — G8929 Other chronic pain: Secondary | ICD-10-CM | POA: Diagnosis present

## 2016-06-12 DIAGNOSIS — M4647 Discitis, unspecified, lumbosacral region: Secondary | ICD-10-CM | POA: Insufficient documentation

## 2016-06-12 DIAGNOSIS — Z5181 Encounter for therapeutic drug level monitoring: Secondary | ICD-10-CM

## 2016-06-12 DIAGNOSIS — M5416 Radiculopathy, lumbar region: Secondary | ICD-10-CM | POA: Diagnosis present

## 2016-06-12 DIAGNOSIS — M25551 Pain in right hip: Secondary | ICD-10-CM | POA: Diagnosis present

## 2016-06-12 DIAGNOSIS — F17210 Nicotine dependence, cigarettes, uncomplicated: Secondary | ICD-10-CM | POA: Insufficient documentation

## 2016-06-12 DIAGNOSIS — M519 Unspecified thoracic, thoracolumbar and lumbosacral intervertebral disc disorder: Secondary | ICD-10-CM

## 2016-06-12 DIAGNOSIS — G8921 Chronic pain due to trauma: Secondary | ICD-10-CM | POA: Insufficient documentation

## 2016-06-12 DIAGNOSIS — Z79899 Other long term (current) drug therapy: Secondary | ICD-10-CM

## 2016-06-12 DIAGNOSIS — G894 Chronic pain syndrome: Secondary | ICD-10-CM

## 2016-06-12 MED ORDER — HYDROCODONE-ACETAMINOPHEN 10-325 MG PO TABS: 1.0000 | ORAL_TABLET | Freq: Four times a day (QID) | ORAL | 0 refills | Status: DC | PRN

## 2016-06-12 NOTE — Addendum Note (Signed)
Addended by: Angela NevinWESSLING, Kearah Gayden D on: 06/12/2016 03:42 PM   Modules accepted: Orders

## 2016-06-12 NOTE — Progress Notes (Signed)
Subjective:    Patient ID: Matthew Sloan, male    DOB: May 13, 1961, 55 y.o.   MRN: [id number]  HPI: Matthew Sloan is a 55 year old male who returns for follow up appointment for chronic pain and medication refill.He states his pain is located in his lower back radiating into his buttocks, right hip and right lower extremity into his bilateral feet. He rates his pain 9. His current exercise regime is walking and performing stretching exercises using bands and balls daily. Also states he was hospitalized for pneumonia a few weeks ago.   Pain Inventory Average Pain 8 Pain Right Now 9 My pain is constant, sharp, burning, stabbing, tingling and aching  In the last 24 hours, has pain interfered with the following? General activity 10 Relation with others 8 Enjoyment of life 8 What TIME of day is your pain at its worst? . Sleep (in general) Poor  Pain is worse with: walking, bending, sitting, inactivity and standing Pain improves with: medication Relief from Meds: 2  Mobility use a cane ability to climb steps?  yes do you drive?  no Do you have any goals in this area?  yes  Function disabled: date disabled 2011 Do you have any goals in this area?  yes  Neuro/Psych weakness numbness tingling trouble walking spasms  Prior Studies Any changes since last visit?  yes  Physicians involved in your care Any changes since last visit?  no   Family History  Problem Relation Age of Onset  . Cancer Mother 6945    breast cancer  . Heart disease Father 8572    CHF   Social History   Social History  . Marital status: Single    Spouse name: N/A  . Number of children: N/A  . Years of education: N/A   Social History Main Topics  . Smoking status: Current Every Day Smoker    Packs/day: 1.00    Years: 30.00    Types: Cigarettes  . Smokeless tobacco: Never Used     Comment: working on it-down to 0.5 ppd  . Alcohol use No  . Drug use: No  . Sexual activity: Not on file    Other Topics Concern  . Not on file   Social History Narrative  . No narrative on file   Past Surgical History:  Procedure Laterality Date  . bilateral tubes placed in ears  04/12/2012  . CARPAL TUNNEL RELEASE  12/15/2011   Procedure: CARPAL TUNNEL RELEASE;  Surgeon: Tami RibasKevin R Kuzma, MD;  Location: [facility];  Service: Orthopedics;  Laterality: Left;  [name]. MULTIPLE TOOTH EXTRACTIONS    . SHOULDER SURGERY  2012   lt  . ULNAR TUNNEL RELEASE  12/15/2011   Procedure: CUBITAL TUNNEL RELEASE;  Surgeon: Tami RibasKevin R Kuzma, MD;  Location: [facility];  Service: Orthopedics;  Laterality: Left;  left cubital tunnel release   Past Medical History:  Diagnosis Date  . Allergy   . Chronic pain due to trauma   . Disturbance of skin sensation   . Intercostal neuralgia   . Lumbosacral neuritis   . Myocardial infarction   . Rotator cuff (capsule) sprain   . Snores    There were no vitals taken for this visit.  Opioid Risk Score:   Fall Risk Score:  `1  Depression screen PHQ 2/9  Dep[facility] 03/23/2016 02/20/2016 02/01/2015 09/28/2014  Decreased Interest 0 0 0 3  Down, Depressed, Hopeless 0 0 0  2  PHQ - 2 Score 0 0 0 5  Altered sleeping - - - 3  Tired, decreased energy - - - 3  Feeling bad or failure about yourself  - - - 3  Trouble concentrating - - - 2  Moving slowly or fidgety/restless - - - 2  Suicidal thoughts - - - 0  PHQ-9 Score - - - 18   Review of Systems  HENT: Negative.   Eyes: Negative.   Respiratory: Negative.   Cardiovascular: Negative.   Gastrointestinal: Negative.   Endocrine: Negative.   Genitourinary: Negative.   Musculoskeletal: Positive for back pain and gait problem.  Skin: Negative.   Allergic/Immunologic: Negative.   Neurological: Positive for weakness and numbness.  Hematological: Negative.   Psychiatric/Behavioral: Negative.   All other systems reviewed and are negative.      Objective:   Physical Exam   Constitutional: He is oriented to person, place, and time. He appears well-developed and well-nourished.  HENT:  Head: Normocephalic and atraumatic.  Neck: Normal range of motion. Neck supple.  Cardiovascular: Normal rate and regular rhythm.   Pulmonary/Chest: Effort normal and breath sounds normal.  Musculoskeletal:  Upper Extremities: Right: Full ROM and Muscle Strength 5/5 Left: Decreased ROM 45 Degrees and Muscle Strength 5/5 Thoracic Paraspinal Tenderness: T-7-T-12 Lumbar Hypersensitivity Lower Extremities: Right: Decreased ROM and Muscle Strength 4/5 Right Lower Extremity Flexion Produces Pin into Right Hip and Lower Back Left Lower Extremity Decreased ROM and Muscle Strength 4/5 Left Lower Extremity Flexion Produces Pain into Lumbar Arises from chair slowly Antalgic Gait  Neurological: He is alert and oriented to person, place, and time.  Skin: Skin is warm and dry.  Psychiatric: He has a normal mood and affect.  Nursing note and vitals reviewed.         Assessment & Plan:  1. 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  Refilled:Hydrocodone 10/325 mg #120 pills--use every 6 hours as needed. Second script given to accommodate scheduled appointment.  We will continue the opioid monitoring program, this consists of regular clinic visits, examinations, urine drug screen, pill counts as well as use of [place] Cont[place] Substance reporting System. 2. Lumbar Radiculitis: Continue Gabapentin 3. Intercostal neuralgia: Continue Robaxin and Neurontin  4. Insomnia: Continue trazodone.   15 minutes of face to face patient care time was spent during this visit. All questions were encouraged and answered.   F/U in 1 month

## 2016-06-18 LAB — TOXASSURE SELECT,+ANTIDEPR,UR

## 2016-07-17 ENCOUNTER — Encounter: Payer: Medicare HMO | Admitting: Registered Nurse

## 2016-08-10 ENCOUNTER — Telehealth: Payer: Self-pay | Admitting: *Deleted

## 2016-08-10 ENCOUNTER — Telehealth: Payer: Self-pay | Admitting: Registered Nurse

## 2016-08-10 ENCOUNTER — Encounter: Payer: Medicare HMO | Attending: Physical Medicine & Rehabilitation | Admitting: Registered Nurse

## 2016-08-10 ENCOUNTER — Encounter: Payer: Self-pay | Admitting: Registered Nurse

## 2016-08-10 VITALS — BP 146/84 | HR 95

## 2016-08-10 DIAGNOSIS — M4647 Discitis, unspecified, lumbosacral region: Secondary | ICD-10-CM | POA: Diagnosis not present

## 2016-08-10 DIAGNOSIS — M25551 Pain in right hip: Secondary | ICD-10-CM | POA: Insufficient documentation

## 2016-08-10 DIAGNOSIS — G8929 Other chronic pain: Secondary | ICD-10-CM | POA: Diagnosis present

## 2016-08-10 DIAGNOSIS — Z5181 Encounter for therapeutic drug level monitoring: Secondary | ICD-10-CM

## 2016-08-10 DIAGNOSIS — F17210 Nicotine dependence, cigarettes, uncomplicated: Secondary | ICD-10-CM | POA: Diagnosis not present

## 2016-08-10 DIAGNOSIS — Z79899 Other long term (current) drug therapy: Secondary | ICD-10-CM

## 2016-08-10 DIAGNOSIS — M5416 Radiculopathy, lumbar region: Secondary | ICD-10-CM | POA: Insufficient documentation

## 2016-08-10 DIAGNOSIS — G894 Chronic pain syndrome: Secondary | ICD-10-CM

## 2016-08-10 DIAGNOSIS — G8921 Chronic pain due to trauma: Secondary | ICD-10-CM | POA: Diagnosis not present

## 2016-08-10 DIAGNOSIS — M519 Unspecified thoracic, thoracolumbar and lumbosacral intervertebral disc disorder: Secondary | ICD-10-CM | POA: Diagnosis not present

## 2016-08-10 MED ORDER — HYDROCODONE-ACETAMINOPHEN 10-325 MG PO TABS: 1.0000 | ORAL_TABLET | Freq: Four times a day (QID) | ORAL | 0 refills | Status: DC | PRN

## 2016-08-10 NOTE — Addendum Note (Signed)
Addended by: Barbee ShropshireBRIGHT, BRUCE B on: 08/10/2016 11:40 AM   Modules accepted: Orders

## 2016-08-10 NOTE — Addendum Note (Signed)
Addended by: Jones BalesHOMAS, Dushaun Okey L on: 08/10/2016 10:40 AM   Modules accepted: Orders

## 2016-08-10 NOTE — Telephone Encounter (Signed)
I spoke with Matthew Sloan in toxicology for [facility] about the low level positive amphetamine in the 12/1/178 UDS report for Matthew Sloan.  She says that they do 2 different tests and they are specific for amphetamine so another drug would not give a false positive.  This would be triggered by something such as Vyvanse or Adderall and would be specific to amphetamine.

## 2016-08-10 NOTE — Telephone Encounter (Signed)
On 08/10/2016 NCCSR was reviewed, no conflict was noted on the [facility] Reporting [place] with multiple prescribers. [name] has a signed narcotic contract with our office. If there were any discrepancies this would have been reported to his physician.

## 2016-08-10 NOTE — Progress Notes (Addendum)
Subjective:    Patient ID: Matthew Sloan, male    DOB: 1961-03-09, 56 y.o.   MRN: [id number]  HPI: Matthew Sloan is a 56 year old male who returns for follow up appointment for chronic pain and medication refill.He states his pain is located in his lower back radiating into his  right hip and right lower extremity into his bilateral feet. He rates his pain 9. His current exercise regime is walking and performing stretching exercises using bands and balls daily. UDS reviewed with Dr. Wynn Banker positive Amphetamine, Matthew Sloan has never had an inconsistent UDS.  He states he had Tums, Prilosec for GERD and Miralax for constipation. A call will be placed to the Toxicologist at [employer] [employer]. UDS was repeated today. Matthew Sloan states he onlt take the medications that are prescribed by his physcians, he is aware of the above and verbalizes understanding.  Pain Inventory Average Pain 8 Pain Right Now 9 My pain is sharp, burning, stabbing, tingling and aching  In the last 24 hours, has pain interfered with the following? General activity 10 Relation with others 8 Enjoyment of life 8 What TIME of day is your pain at its worst? all Sleep (in general) Poor  Pain is worse with: walking, bending, sitting, inactivity and standing Pain improves with: medication Relief from Meds: 2  Mobility use a cane ability to climb steps?  no do you drive?  yes Do you have any goals in this area?  yes  Function disabled: date disabled . Do you have any goals in this area?  yes  Neuro/Psych weakness numbness tingling trouble walking spasms  Prior Studies Any changes since last visit?  no  Physicians involved in your care Any changes since last visit?  no   Family History  Problem Relation Age of Onset  . Cancer Mother 38    breast cancer  . Heart disease Father 48    CHF   Social History   Social History  . Marital status: Single    Spouse name: N/A  . Number of children: N/A  .  Years of education: N/A   Social History Main Topics  . Smoking status: Current Every Day Smoker    Packs/day: 1.00    Years: 30.00    Types: Cigarettes  . Smokeless tobacco: Never Used     Comment: working on it-down to 0.5 ppd  . Alcohol use No  . Drug use: No  . Sexual activity: Not on file   Other Topics Concern  . Not on file   Social History Narrative  . No narrative on file   Past Surgical History:  Procedure Laterality Date  . bilateral tubes placed in ears  04/12/2012  . CARPAL TUNNEL RELEASE  12/15/2011   Procedure: CARPAL TUNNEL RELEASE;  Surgeon: [name], MD;  Location: [facility];  Service: Orthopedics;  Laterality: Left;  [name] MULTIPLE TOOTH EXTRACTIONS    . SHOULDER SURGERY  2012   lt  . ULNAR TUNNEL RELEASE  12/15/2011   Procedure: CUBITAL TUNNEL RELEASE;  Surgeon: [name], MD;  Location: [facility];  Service: Orthopedics;  Laterality: Left;  left cubital tunnel release   Past Medical History:  Diagnosis Date  . Allergy   . Chronic pain due to trauma   . Disturbance of skin sensation   . Intercostal neuralgia   . Lumbosacral neuritis   . Myocardial infarction   . Rotator cuff (capsule) sprain   .  Snores    There were no vitals taken for this visit.  Opioid Risk Score:   Fall Risk Score:  `1  Depression screen PHQ 2/9  [facility] 03/23/2016 02/20/2016 02/01/2015 09/28/2014  Decreased Interest 0 0 0 3  Down, Depressed, Hopeless 0 0 0 2  PHQ - 2 Score 0 0 0 5  Altered sleeping - - - 3  Tired, decreased energy - - - 3  Feeling bad or failure about yourself  - - - 3  Trouble concentrating - - - 2  Moving slowly or fidgety/restless - - - 2  Suicidal thoughts - - - 0  PHQ-9 Score - - - 18   Review of Systems  HENT: Negative.   Eyes: Negative.   Respiratory: Negative.   Cardiovascular: Negative.   Gastrointestinal: Negative.   Endocrine: Negative.   Genitourinary: Negative.   Musculoskeletal:  Positive for back pain and gait problem.  Skin: Negative.   Allergic/Immunologic: Negative.   Neurological: Positive for weakness and numbness.  Hematological: Negative.   Psychiatric/Behavioral: Negative.   All other systems reviewed and are negative.      Objective:   Physical Exam  Constitutional: He is oriented to person, place, and time. He appears well-developed and well-nourished.  HENT:  Head: Normocephalic and atraumatic.  Neck: Normal range of motion. Neck supple.  Cardiovascular: Normal rate and regular rhythm.   Pulmonary/Chest: Effort normal and breath sounds normal.  Musculoskeletal:  Normal Muscle Bulk and Muscle Testing Reveals: Upper Extremities: Right: Full ROM and Muscle Strength 5/5 Left: Decreased ROM: 45 Degrees and Muscle Strength 5/5 Bilateral AC Joint Tenderness Thoracic Paraspinal Tenderness: T-10- T-12 Lumbar Paraspinal Tenderness: L-3-L-5 Lower Extremities: Decreased ROM  Right Lower Extremity Flexion Produces Pain into Lumbar and Right Hip Left Lower Extremity Flexion Produces Pain into Right Hip and Lumbar ( Mainly Right Side) Arises from Table Slowly using straight cane for support Antalgic Gait  Neurological: He is alert and oriented to person, place, and time.  Skin: Skin is warm and dry.  Psychiatric: He has a normal mood and affect.  Nursing note and vitals reviewed.         Assessment & Plan:  1. 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  Refilled:Hydrocodone 10/325 mg #120 pills--use every 6 hours as needed.   We will continue the opioid monitoring program, this consists of regular clinic visits, examinations, urine drug screen, pill counts as well as use of [place] Substance reporting System. 2. Lumbar Radiculitis: Continue Gabapentin 3. Intercostal neuralgia: Continue Robaxin and Neurontin  4. Insomnia: Continue trazodone.    15 minutes of face to face patient care time was spent during this visit. All questions were encouraged and answered.   F/U in 1 month

## 2016-08-15 LAB — TOXASSURE SELECT,+ANTIDEPR,UR

## 2016-08-31 NOTE — Progress Notes (Signed)
Urine drug screen for this encounter is consistent for prescribed medication 

## 2016-09-09 ENCOUNTER — Encounter: Payer: Medicare HMO | Attending: Physical Medicine & Rehabilitation | Admitting: Registered Nurse

## 2016-09-09 ENCOUNTER — Encounter: Payer: Self-pay | Admitting: Registered Nurse

## 2016-09-09 VITALS — BP 155/77 | HR 83 | Resp 16

## 2016-09-09 DIAGNOSIS — G8921 Chronic pain due to trauma: Secondary | ICD-10-CM | POA: Diagnosis not present

## 2016-09-09 DIAGNOSIS — M4647 Discitis, unspecified, lumbosacral region: Secondary | ICD-10-CM | POA: Diagnosis not present

## 2016-09-09 DIAGNOSIS — M25551 Pain in right hip: Secondary | ICD-10-CM

## 2016-09-09 DIAGNOSIS — Z5181 Encounter for therapeutic drug level monitoring: Secondary | ICD-10-CM

## 2016-09-09 DIAGNOSIS — Z79899 Other long term (current) drug therapy: Secondary | ICD-10-CM

## 2016-09-09 DIAGNOSIS — G8929 Other chronic pain: Secondary | ICD-10-CM | POA: Diagnosis not present

## 2016-09-09 DIAGNOSIS — F17210 Nicotine dependence, cigarettes, uncomplicated: Secondary | ICD-10-CM | POA: Insufficient documentation

## 2016-09-09 DIAGNOSIS — M519 Unspecified thoracic, thoracolumbar and lumbosacral intervertebral disc disorder: Secondary | ICD-10-CM | POA: Diagnosis not present

## 2016-09-09 DIAGNOSIS — G894 Chronic pain syndrome: Secondary | ICD-10-CM | POA: Diagnosis not present

## 2016-09-09 DIAGNOSIS — M5416 Radiculopathy, lumbar region: Secondary | ICD-10-CM | POA: Diagnosis present

## 2016-09-09 MED ORDER — METHOCARBAMOL 500 MG PO TABS: 500.0000 mg | ORAL_TABLET | Freq: Three times a day (TID) | ORAL | 3 refills | Status: DC

## 2016-09-09 MED ORDER — HYDROCODONE-ACETAMINOPHEN 10-325 MG PO TABS: 1.0000 | ORAL_TABLET | Freq: Four times a day (QID) | ORAL | 0 refills | Status: DC | PRN

## 2016-09-09 MED ORDER — GABAPENTIN 800 MG PO TABS
800.0000 mg | ORAL_TABLET | Freq: Three times a day (TID) | ORAL | 3 refills | Status: DC
Start: 2016-09-09 — End: 2016-12-09

## 2016-09-09 NOTE — Progress Notes (Signed)
Subjective:    Patient ID: Matthew Sloan, male    DOB: 1960-09-22, 56 y.o.   MRN: [id number]  HPI:[name] is a 56 year old male who returns for follow up appointmentfor chronic pain and medication refill.He states his pain is located in his lower back radiating into his right hip and right lower extremity and bilateral feet. Also states he has generalized pain all over.He rates his pain 8. His current exercise regime is walking and performing stretching exercises using bands and balls daily.  [name] has had two falls since last visit. He was trying to sit in his kitchen chair when his right leg gave out and landed on his right side he was able to pick himself up. The other fall was last week when he was walking to his mailbox and his right leg gave out and he landed on right knee. He was able to pick himself up. Educated on falls prevention, he had his cane with me. Instructed to obtain the X-rays Dr. Wynn Banker ordered, he verbalizes understanding.   Pain Inventory Average Pain 8 Pain Right Now 8 My pain is constant, sharp, burning, stabbing, tingling and aching  In the last 24 hours, has pain interfered with the following? General activity 10 Relation with others 8 Enjoyment of life 8 What TIME of day is your pain at its worst? all Sleep (in general) Poor  Pain is worse with: walking, bending, sitting, inactivity and standing Pain improves with: medication Relief from Meds: 2  Mobility use a cane  Function disabled: date disabled .  Neuro/Psych weakness numbness tremor tingling trouble walking spasms  Prior Studies Any changes since last visit?  no  Physicians involved in your care Any changes since last visit?  no   Family History  Problem Relation Age of Onset  . Cancer Mother 1    breast cancer  . Heart disease Father 33    CHF   Social History   Social History  . Marital status: Single    Spouse name: N/A  . Number of children: N/A    . Years of education: N/A   Social History Main Topics  . Smoking status: Current Every Day Smoker    Packs/day: 1.00    Years: 30.00    Types: Cigarettes  . Smokeless tobacco: Never Used     Comment: working on it-down to 0.5 ppd  . Alcohol use No  . Drug use: No  . Sexual activity: Not Asked   Other Topics Concern  . None   Social History Narrative  . None   Past Surgical History:  Procedure Laterality Date  . bilateral tubes placed in ears  04/12/2012  . CARPAL TUNNEL RELEASE  12/15/2011   Procedure: CARPAL TUNNEL RELEASE;  Surgeon: [name], MD;  Location: [facility];  Service: Orthopedics;  Laterality: Left;  [name] MULTIPLE TOOTH EXTRACTIONS    . SHOULDER SURGERY  2012   lt  . ULNAR TUNNEL RELEASE  12/15/2011   Procedure: CUBITAL TUNNEL RELEASE;  Surgeon: [name], MD;  Location:  SUR[facility]ERY [facility];  Service: Orthopedics;  Laterality: Left;  left cubital tunnel release   Past Medical History:  Diagnosis Date  . Allergy   . Chronic pain due to trauma   . Disturbance of skin sensation   . Intercostal neuralgia   . Lumbosacral neuritis   . Myocardial infarction   . Rotator cuff (capsule) sprain   . Snores  BP (!) 155/77   Pulse 83   Resp 16   SpO2 95%   Opioid Risk Score:   Fall Risk Score:  `1  Depression screen PHQ 2/9  [facility] 09/09/2016 03/23/2016 02/20/2016 02/01/2015 09/28/2014  Decreased Interest 0 0 0 0 3  Down, Depressed, Hopeless 0 0 0 0 2  PHQ - 2 Score 0 0 0 0 5  Altered sleeping - - - - 3  Tired, decreased energy - - - - 3  Feeling bad or failure about yourself  - - - - 3  Trouble concentrating - - - - 2  Moving slowly or fidgety/restless - - - - 2  Suicidal thoughts - - - - 0  PHQ-9 Score - - - - 18    Review of Systems  HENT: Negative.   Eyes: Negative.   Respiratory: Negative.   Cardiovascular: Negative.   Gastrointestinal: Negative.   Endocrine: Negative.   Genitourinary: Negative.    Musculoskeletal: Positive for gait problem.  Skin: Negative.   Allergic/Immunologic: Negative.   Neurological: Positive for tremors, weakness and numbness.  Hematological: Negative.   Psychiatric/Behavioral: Negative.   All other systems reviewed and are negative.      Objective:   Physical Exam  Constitutional: He is oriented to person, place, and time. He appears well-developed and well-nourished.  HENT:  Head: Normocephalic and atraumatic.  Neck: Normal range of motion. Neck supple.  Cardiovascular: Normal rate and regular rhythm.   Pulmonary/Chest: Effort normal and breath sounds normal.  Musculoskeletal:  Normal Muscle Bulk and Muscle Testing Reveals: Upper Extremities: Right: Decreased ROM 90 Degrees and Muscle Strength 5/5 Left: Decreased ROM 45 Degrees and Muscle Strength 4/5 Left: AC Joint Tenderness Thoracic Paraspinal Tenderness: T-1-T-3 T-11-T-12 Lumbar Hypersensitivity Lower Extremities: Right: Decreased ROM and Muscle Strength 4/5 Right Lower Extremity Flexion Produces Pain into Right Hip Left: Decreased ROM and Muscle Strength 4/5 Left Lower Extremity Flexion Produces Pain into Lumbar Arises from Chair slowly  Neurological: He is alert and oriented to person, place, and time.  Skin: Skin is warm and dry.  Psychiatric: He has a normal mood and affect.  Nursing note and vitals reviewed.         Assessment & Plan:  1. 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 09/09/2016 Refilled:Hydrocodone 10/325 mg #120 pills--use every 6 hours as needed.  We will continue the opioid monitoring program, this consists of regular clinic visits, examinations, urine drug screen, pill counts as well as use of [place] Cont[place] Sub[place] System. 2. Lumbar Radiculitis: Continue Gabapentin. 09/09/2016 3. Intercostal neuralgia: Continue Robaxin and Neurontin. 09/09/2016 4.  Insomnia: Continue trazodone. 09/09/2016  20 minutes of face to face patient care time was spent during this visit. All questions were encouraged and answered.   F/U in 1 month

## 2016-10-07 ENCOUNTER — Encounter: Payer: Medicare HMO | Attending: Physical Medicine & Rehabilitation | Admitting: Registered Nurse

## 2016-10-07 ENCOUNTER — Encounter: Payer: Self-pay | Admitting: Registered Nurse

## 2016-10-07 VITALS — BP 131/69 | HR 95 | Resp 14

## 2016-10-07 DIAGNOSIS — Z79899 Other long term (current) drug therapy: Secondary | ICD-10-CM

## 2016-10-07 DIAGNOSIS — G8921 Chronic pain due to trauma: Secondary | ICD-10-CM | POA: Diagnosis not present

## 2016-10-07 DIAGNOSIS — M25551 Pain in right hip: Secondary | ICD-10-CM | POA: Diagnosis present

## 2016-10-07 DIAGNOSIS — G8929 Other chronic pain: Secondary | ICD-10-CM | POA: Insufficient documentation

## 2016-10-07 DIAGNOSIS — F17210 Nicotine dependence, cigarettes, uncomplicated: Secondary | ICD-10-CM | POA: Diagnosis not present

## 2016-10-07 DIAGNOSIS — M5416 Radiculopathy, lumbar region: Secondary | ICD-10-CM | POA: Insufficient documentation

## 2016-10-07 DIAGNOSIS — M4647 Discitis, unspecified, lumbosacral region: Secondary | ICD-10-CM | POA: Diagnosis not present

## 2016-10-07 DIAGNOSIS — G894 Chronic pain syndrome: Secondary | ICD-10-CM

## 2016-10-07 DIAGNOSIS — M519 Unspecified thoracic, thoracolumbar and lumbosacral intervertebral disc disorder: Secondary | ICD-10-CM | POA: Diagnosis not present

## 2016-10-07 DIAGNOSIS — Z5181 Encounter for therapeutic drug level monitoring: Secondary | ICD-10-CM | POA: Diagnosis not present

## 2016-10-07 MED ORDER — HYDROCODONE-ACETAMINOPHEN 10-325 MG PO TABS: 1.0000 | ORAL_TABLET | Freq: Four times a day (QID) | ORAL | 0 refills | Status: DC | PRN

## 2016-10-07 NOTE — Progress Notes (Signed)
Subjective:    Patient ID: Matthew Sloan, male    DOB: [birth year], 58 y.o.   MRN: [id number]  HPI: [name] is a 56year old male who returns for follow up appointmentfor chronic pain and medication refill.He states his pain is located in his lower back radiating into his right hip and right lower extremity. Also states he has generalized pain all over.He rates his pain 9. His current exercise regime is walking and performing stretching exercises using bands and balls daily.   Pain Inventory Average Pain 8 Pain Right Now 9 My pain is constant, sharp, burning, dull, stabbing, tingling and aching  In the last 24 hours, has pain interfered with the following? General activity 10 Relation with others 8 Enjoyment of life 8 What TIME of day is your pain at its worst? all Sleep (in general) Poor  Pain is worse with: walking, bending, sitting, inactivity and standing Pain improves with: medication Relief from Meds: 3  Mobility use a cane how many minutes can you walk? 10 ability to climb steps?  no do you drive?  yes Do you have any goals in this area?  yes  Function disabled: date disabled . Do you have any goals in this area?  yes  Neuro/Psych weakness numbness tingling trouble walking spasms  Prior Studies Any changes since last visit?  no  Physicians involved in your care Any changes since last visit?  no   Family History  Problem Relation Age of Onset  . Cancer Mother [id number]    breast cancer  . Heart disease Father 3172    CHF   Social History   Social History  . Marital status: Single    Spouse name: N/A  . Number of children: N/A  . Years of education: N/A   Social History Main Topics  . Smoking status: Current Every Day Smoker    Packs/day: 1.00    Years: 30.00    Types: Cigarettes  . Smokeless tobacco: Never Used     Comment: working on it-down to 0.5 ppd  . Alcohol use No  . Drug use: No  . Sexual activity: Not Asked   Other Topics  Concern  . None   Social History Narrative  . None   Past Surgical History:  Procedure Laterality Date  . bilateral tubes placed in ears  04/12/2012  . CARPAL TUNNEL RELEASE  12/15/2011   Procedure: CARPAL TUNNEL RELEASE;  Surgeon: Tami RibasKevin R Kuzma, MD;  Location: [facility];  Service: Orthopedics;  Laterality: Left;  [name]. MULTIPLE TOOTH EXTRACTIONS    . SHOULDER SURGERY  2012   lt  . ULNAR TUNNEL RELEASE  12/15/2011   Procedure: CUBITAL TUNNEL RELEASE;  Surgeon: Tami RibasKevin R Kuzma, MD;  Location: [facility];  Service: Orthopedics;  Laterality: Left;  left cubital tunnel release   Past Medical History:  Diagnosis Date  . Allergy   . Chronic pain due to trauma   . Disturbance of skin sensation   . Intercostal neuralgia   . Lumbosacral neuritis   . Myocardial infarction   . Rotator cuff (capsule) sprain   . Snores    BP 131/69 (BP Location: Left Arm, Patient Position: Sitting, Cuff Size: Normal)   Pulse 95   Resp 14   SpO2 99%   Opioid Risk Score:   Fall Risk Score:  `1  Depression screen PHQ 2/9  [facility] 09/09/2016 03/23/2016 02/20/2016 02/01/2015 09/28/2014  Decreased Interest 0 0  0 0 3  Down, Depressed, Hopeless 0 0 0 0 2  PHQ - 2 Score 0 0 0 0 5  Altered sleeping - - - - 3  Tired, decreased energy - - - - 3  Feeling bad or failure about yourself  - - - - 3  Trouble concentrating - - - - 2  Moving slowly or fidgety/restless - - - - 2  Suicidal thoughts - - - - 0  PHQ-9 Score - - - - 18    Review of Systems  HENT: Negative.   Eyes: Negative.   Respiratory: Negative.   Cardiovascular: Negative.   Gastrointestinal: Negative.   Endocrine: Negative.   Genitourinary: Negative.   Musculoskeletal: Positive for arthralgias, back pain, gait problem, myalgias and neck pain.       Spasms  Allergic/Immunologic: Negative.   Neurological: Positive for weakness and numbness.       Tingling  Hematological: Negative.   Psychiatric/Behavioral:  Negative.        Objective:   Physical Exam  Constitutional: He is oriented to person, place, and time. He appears well-developed and well-nourished.  HENT:  Head: Normocephalic and atraumatic.  Neck: Normal range of motion. Neck supple.  Cardiovascular: Normal rate and regular rhythm.   Pulmonary/Chest: Effort normal and breath sounds normal.  Musculoskeletal:  Normal Muscle Bulk and Muscle Testing Reveals:  Upper Extremities: Right: Full ROM and Muscle Strength 5/5 Left: Decreased ROM 45 Degrees and Muscle Strength 5/5 Lumbar Paraspinal Tenderness: L-3-L-5 Right: Greater Trochanteric Tenderness Lower Extremities: Right: Decreased ROM and Muscle Strength 4/5 Right Lower Extremity Flexion Produces Pain into Lumbar and Right Hip Left: Full ROM and Muscle Strength 5/5 Arises from chair slowly Narrow Based Gait  Neurological: He is alert and oriented to person, place, and time.  Skin: Skin is warm and dry.  Psychiatric: He has a normal mood and affect.  Nursing note and vitals reviewed.         Assessment & Plan:  1. 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 10/07/2016 Refilled:Hydrocodone 10/325 mg #120 pills--use every 6 hours as needed.  We will continue the opioid monitoring program, this consists of regular clinic visits, examinations, urine drug screen, pill counts as well as use of [place] Controlled Substance reporting System. 2. Lumbar Radiculitis: Continue Gabapentin. 10/07/2016 3. Intercostal neuralgia: Continue Robaxin and Neurontin. 10/07/2016 4. Insomnia: Continue trazodone. 10/07/2016  20  minutes of face to face patient care time was spent during this visit. All questions were encouraged and answered.   F/U in 1 month

## 2016-11-09 ENCOUNTER — Encounter: Payer: Medicare HMO | Attending: Physical Medicine & Rehabilitation | Admitting: Registered Nurse

## 2016-11-09 ENCOUNTER — Telehealth: Payer: Self-pay | Admitting: Registered Nurse

## 2016-11-09 ENCOUNTER — Encounter: Payer: Self-pay | Admitting: Registered Nurse

## 2016-11-09 VITALS — BP 118/75 | HR 80

## 2016-11-09 DIAGNOSIS — M519 Unspecified thoracic, thoracolumbar and lumbosacral intervertebral disc disorder: Secondary | ICD-10-CM | POA: Diagnosis not present

## 2016-11-09 DIAGNOSIS — Z5181 Encounter for therapeutic drug level monitoring: Secondary | ICD-10-CM

## 2016-11-09 DIAGNOSIS — G8929 Other chronic pain: Secondary | ICD-10-CM

## 2016-11-09 DIAGNOSIS — M5416 Radiculopathy, lumbar region: Secondary | ICD-10-CM

## 2016-11-09 DIAGNOSIS — G8921 Chronic pain due to trauma: Secondary | ICD-10-CM | POA: Insufficient documentation

## 2016-11-09 DIAGNOSIS — Z79899 Other long term (current) drug therapy: Secondary | ICD-10-CM

## 2016-11-09 DIAGNOSIS — M25551 Pain in right hip: Secondary | ICD-10-CM | POA: Insufficient documentation

## 2016-11-09 DIAGNOSIS — F17210 Nicotine dependence, cigarettes, uncomplicated: Secondary | ICD-10-CM | POA: Diagnosis not present

## 2016-11-09 DIAGNOSIS — G894 Chronic pain syndrome: Secondary | ICD-10-CM | POA: Diagnosis not present

## 2016-11-09 DIAGNOSIS — M4647 Discitis, unspecified, lumbosacral region: Secondary | ICD-10-CM | POA: Diagnosis not present

## 2016-11-09 MED ORDER — HYDROCODONE-ACETAMINOPHEN 10-325 MG PO TABS: 1.0000 | ORAL_TABLET | Freq: Four times a day (QID) | ORAL | 0 refills | Status: DC | PRN

## 2016-11-09 NOTE — Progress Notes (Signed)
Subjective:    Patient ID: Matthew Sloan, male    DOB: [birth year], 56 y.o.   MRN: [id number]  HPI: [name] is a 56year old male who returns for follow up appointmentfor chronic pain and medication refill.He states his pain is located in his lower back radiating into his right hip and right lower extremity. Also states he has generalized pain all over.He rates his pain 8. His current exercise regime is walking and performing stretching exercises using bands and balls daily. Matthew Sloan states his girlfriend passed away few weeks ago, she had Cancer, emotional support given.   Last UDS was on 08/10/2016,it was consistent.  UDS was ordered today.   Pain Inventory Average Pain 8 Pain Right Now 8 My pain is sharp, burning, stabbing, tingling and aching  In the last 24 hours, has pain interfered with the following? General activity 10 Relation with others 8 Enjoyment of life 8 What TIME of day is your pain at its worst? all times Sleep (in general) Poor  Pain is worse with: walking, bending, sitting, inactivity and standing Pain improves with: not much helps Relief from Meds: 2  Mobility use a cane Do you have any goals in this area?  yes  Function disabled: date disabled . Do you have any goals in this area?  yes  Neuro/Psych weakness numbness tremor tingling trouble walking spasms  Prior Studies Any changes since last visit?  no  Physicians involved in your care Any changes since last visit?  no   Family History  Problem Relation Age of Onset  . Cancer Mother 50    breast cancer  . Heart disease Father 80    CHF   Social History   Social History  . Marital status: Single    Spouse name: N/A  . Number of children: N/A  . Years of education: N/A   Social History Main Topics  . Smoking status: Current Every Day Smoker    Packs/day: 1.00    Years: 30.00    Types: Cigarettes  . Smokeless tobacco: Never Used     Comment: working on it-down to  0.5 ppd  . Alcohol use No  . Drug use: No  . Sexual activity: Not on file   Other Topics Concern  . Not on file   Social History Narrative  . No narrative on file   Past Surgical History:  Procedure Laterality Date  . bilateral tubes placed in ears  04/12/2012  . CARPAL TUNNEL RELEASE  12/15/2011   Procedure: CARPAL TUNNEL RELEASE;  Surgeon: [name], MD;  Location: [facility];  Service: Orthopedics;  Laterality: Left;  [name] MULTIPLE TOOTH EXTRACTIONS    . SHOULDER SURGERY  2012   lt  . ULNAR TUNNEL RELEASE  12/15/2011   Procedure: CUBITAL TUNNEL RELEASE;  Surgeon: [name], MD;  Location: [facility];  Service: Orthopedics;  Laterality: Left;  left cubital tunnel release   Past Medical History:  Diagnosis Date  . Allergy   . Chronic pain due to trauma   . Disturbance of skin sensation   . Intercostal neuralgia   . Lumbosacral neuritis   . Myocardial infarction   . Rotator cuff (capsule) sprain   . Snores    There were no vitals taken for this visit.  Opioid Risk Score:   Fall Risk Score:  `1  Depression screen PHQ 2/9  Depression screen [facility] 2/9 09/09/2016 03/23/2016 02/20/2016 02/01/2015 09/28/2014  Decreased  Interest 0 0 0 0 3  Down, Depressed, Hopeless 0 0 0 0 2  PHQ - 2 Score 0 0 0 0 5  Altered sleeping - - - - 3  Tired, decreased energy - - - - 3  Feeling bad or failure about yourself  - - - - 3  Trouble concentrating - - - - 2  Moving slowly or fidgety/restless - - - - 2  Suicidal thoughts - - - - 0  PHQ-9 Score - - - - 18    Review of Systems  HENT: Negative.   Eyes: Negative.   Respiratory: Negative.   Cardiovascular: Negative.   Gastrointestinal: Negative.   Endocrine: Negative.   Genitourinary: Negative.   Musculoskeletal: Positive for back pain.  Skin: Negative.   Allergic/Immunologic: Negative.   Neurological: Positive for tremors, weakness and numbness.  Hematological: Negative.   Psychiatric/Behavioral:  Negative.        Objective:   Physical Exam  Constitutional: He is oriented to person, place, and time. He appears well-developed and well-nourished.  HENT:  Head: Normocephalic and atraumatic.  Neck: Normal range of motion. Neck supple.  Cardiovascular: Normal rate and regular rhythm.   Pulmonary/Chest: Effort normal and breath sounds normal.  Musculoskeletal:  Normal Muscle Bulk and Muscle Testing Reveals: Upper Extremities: Full ROM and Muscle Strength 5/5 Thoracic Hypersensitivity Lumbar Paraspinal Tenderness: L-3-L-5 Right Greater Trochanter Tenderness Lower Extremities: Right: Decreased ROM and Muscle Strength 4/5 Left: Lower Extremities: Full ROM and Muscle Strength 5/5 Arises from chair slowly using straight cane for support Antalgic Gait  Neurological: He is alert and oriented to person, place, and time.  Skin: Skin is warm and dry.  Psychiatric: He has a normal mood and affect.  Nursing note and vitals reviewed.         Assessment & Plan:  1. 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 04/302018 Refilled:Hydrocodone 10/325 mg #120 pills--use every 6 hours as needed.  We will continue the opioid monitoring program, this consists of regular clinic visits, examinations, urine drug screen, pill counts as well as use of [place] Controlled Substance reporting System. 2. Lumbar Radiculitis: Continue Gabapentin. 11/09/2016 3. Intercostal neuralgia: Continue Robaxin and Neurontin. 11/09/2016 4. Insomnia: Continue trazodone. 11/09/2016  20 minutes of face to face patient care time was spent during this visit. All questions were encouraged and answered.   F/U in 1 month

## 2016-11-09 NOTE — Telephone Encounter (Signed)
On 11/09/2016 the NCCSR was reviewed no conflict was seen on the [facility] Reporting System with multiple prescribers. Matthew Sloan has a signed narcotic contract with our office. If there were any discrepancies this would have been reported to hisphysician.

## 2016-11-14 LAB — TOXASSURE SELECT,+ANTIDEPR,UR

## 2016-11-17 ENCOUNTER — Telehealth: Payer: Self-pay | Admitting: *Deleted

## 2016-11-17 NOTE — Telephone Encounter (Signed)
Urine drug screen for this encounter is consistent for prescribed medication. Trazodone was last prescribed 05/15/16 so absent is expected.

## 2016-12-09 ENCOUNTER — Encounter: Payer: Medicare HMO | Attending: Physical Medicine & Rehabilitation | Admitting: Registered Nurse

## 2016-12-09 ENCOUNTER — Encounter: Payer: Self-pay | Admitting: Registered Nurse

## 2016-12-09 VITALS — BP 128/75 | HR 84 | Resp 14

## 2016-12-09 DIAGNOSIS — G894 Chronic pain syndrome: Secondary | ICD-10-CM | POA: Diagnosis not present

## 2016-12-09 DIAGNOSIS — Z5181 Encounter for therapeutic drug level monitoring: Secondary | ICD-10-CM

## 2016-12-09 DIAGNOSIS — G8929 Other chronic pain: Secondary | ICD-10-CM | POA: Diagnosis present

## 2016-12-09 DIAGNOSIS — F17210 Nicotine dependence, cigarettes, uncomplicated: Secondary | ICD-10-CM | POA: Diagnosis not present

## 2016-12-09 DIAGNOSIS — M4647 Discitis, unspecified, lumbosacral region: Secondary | ICD-10-CM | POA: Insufficient documentation

## 2016-12-09 DIAGNOSIS — G8921 Chronic pain due to trauma: Secondary | ICD-10-CM | POA: Diagnosis not present

## 2016-12-09 DIAGNOSIS — Z79899 Other long term (current) drug therapy: Secondary | ICD-10-CM

## 2016-12-09 DIAGNOSIS — M25551 Pain in right hip: Secondary | ICD-10-CM

## 2016-12-09 DIAGNOSIS — M519 Unspecified thoracic, thoracolumbar and lumbosacral intervertebral disc disorder: Secondary | ICD-10-CM

## 2016-12-09 DIAGNOSIS — M5416 Radiculopathy, lumbar region: Secondary | ICD-10-CM | POA: Diagnosis present

## 2016-12-09 MED ORDER — GABAPENTIN 800 MG PO TABS: 800.0000 mg | ORAL_TABLET | Freq: Three times a day (TID) | ORAL | 5 refills | Status: DC

## 2016-12-09 MED ORDER — HYDROCODONE-ACETAMINOPHEN 10-325 MG PO TABS: 1.0000 | ORAL_TABLET | Freq: Four times a day (QID) | ORAL | 0 refills | Status: DC | PRN

## 2016-12-09 MED ORDER — METHOCARBAMOL 500 MG PO TABS: 500.0000 mg | ORAL_TABLET | Freq: Three times a day (TID) | ORAL | 5 refills | Status: DC

## 2016-12-09 NOTE — Progress Notes (Signed)
Subjective:    Patient ID: Matthew Sloan, male    DOB: 12-29-1960, 56 y.o.   MRN: [id number]  HPI: Matthew Sloan is a 56year old male who returns for follow up appointmentfor chronic pain and medication refill.He states his pain is located in his lower back radiating into his right hip and right lower extremity.He rates his pain 9. His current exercise regime is walking and performing stretching exercises using bands and balls daily.  Last UDS was on 11/09/2016,it was consistent.    Pain Inventory Average Pain 8 Pain Right Now 9 My pain is sharp, burning, stabbing, tingling and aching  In the last 24 hours, has pain interfered with the following? General activity 10 Relation with others 8 Enjoyment of life 8 What TIME of day is your pain at its worst? all Sleep (in general) Poor  Pain is worse with: walking, bending, sitting, inactivity and standing Pain improves with: medication Relief from Meds: 2  Mobility walk with assistance use a cane ability to climb steps?  no do you drive?  no Do you have any goals in this area?  yes  Function disabled: date disabled . Do you have any goals in this area?  yes  Neuro/Psych weakness numbness tremor tingling trouble walking spasms  Prior Studies Any changes since last visit?  no  Physicians involved in your care Any changes since last visit?  no   Family History  Problem Relation Age of Onset  . Cancer Mother 75       breast cancer  . Heart disease Father 25       CHF   Social History   Social History  . Marital status: Single    Spouse name: N/A  . Number of children: N/A  . Years of education: N/A   Social History Main Topics  . Smoking status: Current Every Day Smoker    Packs/day: 1.00    Years: 30.00    Types: Cigarettes  . Smokeless tobacco: Never Used     Comment: working on it-down to 0.5 ppd  . Alcohol use No  . Drug use: No  . Sexual activity: Not Asked   Other Topics Concern  .  None   Social History Narrative  . None   Past Surgical History:  Procedure Laterality Date  . bilateral tubes placed in ears  04/12/2012  . CARPAL TUNNEL RELEASE  12/15/2011   Procedure: CARPAL TUNNEL RELEASE;  Surgeon: [name], MD;  Location: [facility];  Service: Orthopedics;  Laterality: Left;  [name] MULTIPLE TOOTH EXTRACTIONS    . SHOULDER SURGERY  2012   lt  . ULNAR TUNNEL RELEASE  12/15/2011   Procedure: CUBITAL TUNNEL RELEASE;  Surgeon: [name], MD;  Location:  [facility];  Service: Orthopedics;  Laterality: Left;  left cubital tunnel release   Past Medical History:  Diagnosis Date  . Allergy   . Chronic pain due to trauma   . Disturbance of skin sensation   . Intercostal neuralgia   . Lumbosacral neuritis   . Myocardial infarction (HCC)   . Rotator cuff (capsule) sprain   . Snores    BP 128/75 (BP Location: Left Arm, Patient Position: Sitting, Cuff Size: Normal)   Pulse 84   Resp 14   SpO2 94%   Opioid Risk Score:   Fall Risk Score:  `1  Depression screen PHQ 2/9  Depression screen [facility] 2/9 09/09/2016 03/23/2016 02/20/2016 02/01/2015 09/28/2014  Decreased  Interest 0 0 0 0 3  Down, Depressed, Hopeless 0 0 0 0 2  PHQ - 2 Score 0 0 0 0 5  Altered sleeping - - - - 3  Tired, decreased energy - - - - 3  Feeling bad or failure about yourself  - - - - 3  Trouble concentrating - - - - 2  Moving slowly or fidgety/restless - - - - 2  Suicidal thoughts - - - - 0  PHQ-9 Score - - - - 18    Review of Systems  HENT: Negative.   Eyes: Negative.   Respiratory: Negative.   Cardiovascular: Negative.   Gastrointestinal: Negative.   Endocrine: Negative.   Genitourinary: Negative.   Musculoskeletal: Positive for arthralgias, back pain, gait problem, myalgias, neck pain and neck stiffness.       Spasms  Skin: Negative.   Allergic/Immunologic: Negative.   Neurological: Positive for tremors, weakness and numbness.       Tingling    Hematological: Negative.   Psychiatric/Behavioral: Negative.   All other systems reviewed and are negative.      Objective:   Physical Exam  Constitutional: He is oriented to person, place, and time. He appears well-developed and well-nourished.  HENT:  Head: Normocephalic and atraumatic.  Neck: Normal range of motion. Neck supple.  Cardiovascular: Normal rate and regular rhythm.   Pulmonary/Chest: Effort normal and breath sounds normal.  Musculoskeletal:  Normal Muscle Bulk and Muscle Testing Reveals: Upper Extremities: Right:Full ROM and Muscle Strength 5/5 Left: Decreased ROM 45 Degrees and Muscle Strength 4/5 Thoracic Paraspinal Tenderness: Mainly Right Side: T-3-T-7 Lumbar Paraspinal Tenderness: L-3-L-5 Right Greater Trochanter Tenderness Lower Extremities: Right: Decreased ROM and Muscle Strength 4/5 Left: Full ROM and Muscle Strength 5/5 Arises from Chair slowly Antalgic Gait   Neurological: He is alert and oriented to person, place, and time.  Skin: Skin is warm and dry.  Psychiatric: He has a normal mood and affect.  Nursing note and vitals reviewed.         Assessment & Plan:  1. 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 05/302018 Refilled:Hydrocodone 10/325 mg #120 pills--use every 6 hours as needed.  We will continue the opioid monitoring program, this consists of regular clinic visits, examinations, urine drug screen, pill counts as well as use of [place] Cont[place] Substance reporting System. 2. Lumbar Radiculitis: Continue Gabapentin. 12/09/2016 3. Intercostal neuralgia: Continue Robaxin, Flector Patch and Neurontin. 12/09/2016 4. Insomnia: Continue trazodone as needed. 12/09/2016  20  minutes of face to face patient care time was spent during this visit. All questions were encouraged and answered.   F/U in 1 month

## 2017-01-06 ENCOUNTER — Ambulatory Visit: Payer: Self-pay | Admitting: Registered Nurse

## 2017-01-08 ENCOUNTER — Encounter: Payer: Medicare HMO | Attending: Physical Medicine & Rehabilitation

## 2017-01-08 ENCOUNTER — Ambulatory Visit (HOSPITAL_BASED_OUTPATIENT_CLINIC_OR_DEPARTMENT_OTHER): Payer: Medicare HMO | Admitting: Physical Medicine & Rehabilitation

## 2017-01-08 ENCOUNTER — Encounter: Payer: Self-pay | Admitting: Physical Medicine & Rehabilitation

## 2017-01-08 VITALS — BP 128/79 | HR 91

## 2017-01-08 DIAGNOSIS — M25551 Pain in right hip: Secondary | ICD-10-CM | POA: Insufficient documentation

## 2017-01-08 DIAGNOSIS — M5416 Radiculopathy, lumbar region: Secondary | ICD-10-CM | POA: Insufficient documentation

## 2017-01-08 DIAGNOSIS — G8921 Chronic pain due to trauma: Secondary | ICD-10-CM | POA: Insufficient documentation

## 2017-01-08 DIAGNOSIS — G8929 Other chronic pain: Secondary | ICD-10-CM | POA: Diagnosis not present

## 2017-01-08 DIAGNOSIS — M4647 Discitis, unspecified, lumbosacral region: Secondary | ICD-10-CM | POA: Diagnosis not present

## 2017-01-08 DIAGNOSIS — F17210 Nicotine dependence, cigarettes, uncomplicated: Secondary | ICD-10-CM | POA: Diagnosis not present

## 2017-01-08 MED ORDER — DICLOFENAC EPOLAMINE 1.3 % TD PTCH: MEDICATED_PATCH | TRANSDERMAL | 2 refills | Status: DC

## 2017-01-08 MED ORDER — TRAZODONE HCL 100 MG PO TABS: ORAL_TABLET | ORAL | 2 refills | Status: DC

## 2017-01-08 NOTE — Progress Notes (Signed)
Subjective:    Patient ID: Matthew Sloan, male    DOB: [birth year], 58 y.o.   MRN: [id number]  HPI Has had heart Rhythm problems, having external monitor placed. No hospitalizations.  Current meds include Flector patch twice a day on ribs Gabapentin. 800 3 times a day, helps with lower extremity burning pain Hydrocodone 10 mg 4 times per day, did pill count took 69 tablets in 10 days, we discussed that it is written for, for a day. He will have to cut down to 3 a day for a few days to get back on track Robaxin 500 mg 3 times per day Trazodone 3 mg at bedtime  constipation issues.  Interval social history, his girlfriend of 22 years recently passed away of cancer. Pain Inventory Average Pain 8 Pain Right Now 9 My pain is sharp, burning, stabbing, tingling and aching  In the last 24 hours, has pain interfered with the following? General activity 10 Relation with others 8 Enjoyment of life 8 What TIME of day is your pain at its worst? all Sleep (in general) Poor  Pain is worse with: walking, bending, sitting, inactivity and standing Pain improves with: rest and medication Relief from Meds: 2  Mobility use a cane ability to climb steps?  no do you drive?  yes  Function disabled: date disabled .  Neuro/Psych weakness numbness tingling trouble walking spasms dizziness  Prior Studies Any changes since last visit?  no  Physicians involved in your care Any changes since last visit?  no   Family History  Problem Relation Age of Onset  . Cancer Mother 41       breast cancer  . Heart disease Father 55       CHF   Social History   Social History  . Marital status: Single    Spouse name: N/A  . Number of children: N/A  . Years of education: N/A   Social History Main Topics  . Smoking status: Current Every Day Smoker    Packs/day: 1.00    Years: 30.00    Types: Cigarettes  . Smokeless tobacco: Never Used     Comment: working on it-down to 0.5 ppd  .  Alcohol use No  . Drug use: No  . Sexual activity: Not on file   Other Topics Concern  . Not on file   Social History Narrative  . No narrative on file   Past Surgical History:  Procedure Laterality Date  . bilateral tubes placed in ears  04/12/2012  . CARPAL TUNNEL RELEASE  12/15/2011   Procedure: CARPAL TUNNEL RELEASE;  Surgeon: [name], MD;  Location: [facility];  Service: Orthopedics;  Laterality: Left;  [name] MULTIPLE TOOTH EXTRACTIONS    . SHOULDER SURGERY  2012   lt  . ULNAR TUNNEL RELEASE  12/15/2011   Procedure: CUBITAL TUNNEL RELEASE;  Surgeon: [name], MD;  Location: [facility];  Service: Orthopedics;  Laterality: Left;  left cubital tunnel release   Past Medical History:  Diagnosis Date  . Allergy   . Chronic pain due to trauma   . Disturbance of skin sensation   . Intercostal neuralgia   . Lumbosacral neuritis   . Myocardial infarction (HCC)   . Rotator cuff (capsule) sprain   . Snores    There were no vitals taken for this visit.  Opioid Risk Score:   Fall Risk Score:  `1  Depression screen PHQ 2/9  Depression screen PHQ  2/9 09/09/2016 03/23/2016 02/20/2016 02/01/2015 09/28/2014  Decreased Interest 0 0 0 0 3  Down, Depressed, Hopeless 0 0 0 0 2  PHQ - 2 Score 0 0 0 0 5  Altered sleeping - - - - 3  Tired, decreased energy - - - - 3  Feeling bad or failure about yourself  - - - - 3  Trouble concentrating - - - - 2  Moving slowly or fidgety/restless - - - - 2  Suicidal thoughts - - - - 0  PHQ-9 Score - - - - 18     Review of Systems  Constitutional: Negative.   HENT: Negative.   Eyes: Negative.   Respiratory: Negative.   Cardiovascular: Negative.   Gastrointestinal: Negative.   Endocrine: Negative.   Genitourinary: Negative.   Musculoskeletal: Negative.   Skin: Negative.   Allergic/Immunologic: Negative.   Neurological: Negative.   Hematological: Negative.   Psychiatric/Behavioral: Negative.   All other systems  reviewed and are negative.      Objective:   Physical Exam  Constitutional: He is oriented to person, place, and time. He appears well-developed and well-nourished.  Neurological: He is alert and oriented to person, place, and time.  Psychiatric: He has a normal mood and affect.  Nursing note and vitals reviewed. Motor strength is 5/5 bilateral deltoid, bicep, tricep, grip 4 minus at the right hip flexor, knee extensor, ankle dorsiflexor, limited by pain. Left side is 5/5 in hip flexion, knee extensors, ankle dorsiflexors Ambulates with cane. Has decreased weightbearing on the right side         Assessment & Plan:  1. Polytrauma after a crush injury sustained while working on a pickup truck. Patient had right hip dislocation, multiple rib fractures, lumbar spinous process fractures. He has been on long-term narcotic analgesics, which has allowed him to maintain at a modified independent level, drive. Continue opioid monitoring program. This consists of regular clinic visits, examinations, urine drug screen, pill counts as well as use of [place] controlled substance reporting System.  We'll need to go down to 3 tablets per day for the rest of the month. If patient continues to exceed his prescribed dosages, he may be discharged from the clinic. Urine drug screen today

## 2017-01-27 ENCOUNTER — Encounter: Payer: Medicare HMO | Attending: Physical Medicine & Rehabilitation | Admitting: Registered Nurse

## 2017-01-27 ENCOUNTER — Telehealth: Payer: Self-pay | Admitting: Registered Nurse

## 2017-01-27 ENCOUNTER — Encounter: Payer: Self-pay | Admitting: Registered Nurse

## 2017-01-27 VITALS — BP 119/74 | HR 90

## 2017-01-27 DIAGNOSIS — G8929 Other chronic pain: Secondary | ICD-10-CM

## 2017-01-27 DIAGNOSIS — R0789 Other chest pain: Secondary | ICD-10-CM

## 2017-01-27 DIAGNOSIS — M4647 Discitis, unspecified, lumbosacral region: Secondary | ICD-10-CM | POA: Insufficient documentation

## 2017-01-27 DIAGNOSIS — G8921 Chronic pain due to trauma: Secondary | ICD-10-CM

## 2017-01-27 DIAGNOSIS — G894 Chronic pain syndrome: Secondary | ICD-10-CM | POA: Diagnosis not present

## 2017-01-27 DIAGNOSIS — F17210 Nicotine dependence, cigarettes, uncomplicated: Secondary | ICD-10-CM | POA: Diagnosis not present

## 2017-01-27 DIAGNOSIS — M25551 Pain in right hip: Secondary | ICD-10-CM

## 2017-01-27 DIAGNOSIS — R0781 Pleurodynia: Secondary | ICD-10-CM

## 2017-01-27 DIAGNOSIS — M5416 Radiculopathy, lumbar region: Secondary | ICD-10-CM | POA: Diagnosis not present

## 2017-01-27 DIAGNOSIS — Z5181 Encounter for therapeutic drug level monitoring: Secondary | ICD-10-CM

## 2017-01-27 DIAGNOSIS — Z79899 Other long term (current) drug therapy: Secondary | ICD-10-CM

## 2017-01-27 MED ORDER — HYDROCODONE-ACETAMINOPHEN 10-325 MG PO TABS: 1.0000 | ORAL_TABLET | Freq: Four times a day (QID) | ORAL | 0 refills | Status: DC | PRN

## 2017-01-27 NOTE — Progress Notes (Signed)
Subjective:    Patient ID: Karis JubaJohn L Nemmers, male    DOB: 17-Jul-1960, 56 y.o.   MRN: [id number]  HPI: Mr. Karis JubaJohn L Bays is a [age]year old male who returns for follow up appointmentfor chronic pain and medication refill.He states his pain is located in his bilateral shoulder pain L>R, lower back radiating into his right hip and right lower extremity laterally.Also states he has generalized pain all over.He rates his pain 9. His current exercise regime is walking and performing stretching exercises using bands and balls daily.   Last UDS was on 11/09/2016,it was consistent.    Pain Inventory Average Pain 8 Pain Right Now 9 My pain is sharp, burning, stabbing, tingling and aching  In the last 24 hours, has pain interfered with the following? General activity 9 Relation with others 8 Enjoyment of life 8 What TIME of day is your pain at its worst? all Sleep (in general) Poor  Pain is worse with: walking, bending, sitting, inactivity and standing Pain improves with: medication Relief from Meds: 2  Mobility use a cane how many minutes can you walk? 10 ability to climb steps?  no do you drive?  yes  Function disabled: date disabled .  Neuro/Psych weakness numbness tremor tingling trouble walking spasms  Prior Studies Any changes since last visit?  no  Physicians involved in your care Any changes since last visit?  no   Family History  Problem Relation Age of Onset  . Cancer Mother [id number]       breast cancer  . Heart disease Father [id number]       CHF   Social History   Social History  . Marital status: Single    Spouse name: N/A  . Number of children: N/A  . Years of education: N/A   Social History Main Topics  . Smoking status: Current Every Day Smoker    Packs/day: 1.00    Years: 30.00    Types: Cigarettes  . Smokeless tobacco: Never Used     Comment: working on it-down to 0.5 ppd  . Alcohol use No  . Drug use: No  . Sexual activity: Not Asked   Other  Topics Concern  . None   Social History Narrative  . None   Past Surgical History:  Procedure Laterality Date  . bilateral tubes placed in ears  04/12/2012  . CARPAL TUNNEL RELEASE  12/15/2011   Procedure: CARPAL TUNNEL RELEASE;  Surgeon: Tami RibasKevin R Kuzma, MD;  Location: [facility];  Service: Orthopedics;  Laterality: Left;  [name]. MULTIPLE TOOTH EXTRACTIONS    . SHOULDER SURGERY  2012   lt  . ULNAR TUNNEL RELEASE  12/15/2011   Procedure: CUBITAL TUNNEL RELEASE;  Surgeon: Tami RibasKevin R Kuzma, MD;  Location: [facility];  Service: Orthopedics;  Laterality: Left;  left cubital tunnel release   Past Medical History:  Diagnosis Date  . Allergy   . Chronic pain due to trauma   . Disturbance of skin sensation   . Intercostal neuralgia   . Lumbosacral neuritis   . Myocardial infarction (HCC)   . Rotator cuff (capsule) sprain   . Snores    BP 119/74   Pulse 90   SpO2 96%   Opioid Risk Score:  0 Fall Risk Score:  `1  Depression screen PHQ 2/9  [facility] 01/27/2017 09/09/2016 03/23/2016 02/20/2016 02/01/2015 09/28/2014  Decreased Interest 0 0 0 0 0 3  Down, Depressed, Hopeless 0 0 0 0 0  2  PHQ - 2 Score 0 0 0 0 0 5  Altered sleeping - - - - - 3  Tired, decreased energy - - - - - 3  Feeling bad or failure about yourself  - - - - - 3  Trouble concentrating - - - - - 2  Moving slowly or fidgety/restless - - - - - 2  Suicidal thoughts - - - - - 0  PHQ-9 Score - - - - - 18   Review of Systems  HENT: Negative.   Eyes: Negative.   Respiratory: Negative.   Cardiovascular: Negative.   Gastrointestinal: Negative.   Endocrine: Negative.   Genitourinary: Negative.   Musculoskeletal: Positive for gait problem.       Spasms  Skin: Negative.   Allergic/Immunologic: Negative.   Neurological: Positive for tremors, weakness and numbness.       Tingling  Hematological: Negative.   Psychiatric/Behavioral: Negative.   All other systems reviewed and are  negative.      Objective:   Physical Exam  Constitutional: He is oriented to person, place, and time. He appears well-developed and well-nourished.  HENT:  Head: Normocephalic and atraumatic.  Neck: Normal range of motion. Neck supple.  Cardiovascular: Normal rate and regular rhythm.   Pulmonary/Chest: Effort normal and breath sounds normal.  Musculoskeletal:  Normal Muscle Bulk and Muscle Testing Reveals: Upper Extremities: Right: Decreased ROM 90 Degrees and Muscle Strength 4/5 Left: Decreased ROM 45 Degrees and Muscle Strength 4/5 Left: AC Joint Tenderness Thoracic Paraspinal Tenderness: T-5-T-7 Lumbar Hypersensitivity Right Greater Trochanter Tenderness Lower Extremities: Decreased ROM and Muscle Strength 4/5 Bilateral Lower Extremities Flexion Produces Pain into Bilateral Hips and Lumbar Arises from Table Slowly using Cane for Support Antalgic Gait  Neurological: He is alert and oriented to person, place, and time.  Skin: Skin is warm and dry.  Psychiatric: He has a normal mood and affect.  Nursing note and vitals reviewed.         Assessment & Plan:  1. 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 01/27/2017 Refilled:Hydrocodone 10/325 mg #120 pills--use every 6 hours as needed.  We will continue the opioid monitoring program, this consists of regular clinic visits, examinations, urine drug screen, pill counts as well as use of [place] Controlled Substance reporting System. 2. Lumbar Radiculitis: Continue Gabapentin. 01/27/2017 3. Intercostal neuralgia: Continue Flector Patch, Robaxin and Neurontin. 01/27/2017 4. Insomnia: Continue trazodone. 01/27/2017  20 minutes of face to face patient care time was spent during this visit. All questions were encouraged and answered.   F/U in 1 month

## 2017-01-27 NOTE — Telephone Encounter (Signed)
On 01/27/17 the NCCSR was reviewed no conflict was seen on the [facility] [place] Controlled Substance Reporting System with multiple prescribers. Mr. Yates DecampJohn Tomb has a signed narcotic contract with our office. If there were any discrepancies this would have been reported to his physician.

## 2017-02-24 ENCOUNTER — Encounter: Payer: Medicare HMO | Attending: Physical Medicine & Rehabilitation | Admitting: Registered Nurse

## 2017-02-24 ENCOUNTER — Encounter: Payer: Self-pay | Admitting: Registered Nurse

## 2017-02-24 VITALS — BP 117/78 | HR 74 | Resp 14

## 2017-02-24 DIAGNOSIS — F17210 Nicotine dependence, cigarettes, uncomplicated: Secondary | ICD-10-CM | POA: Diagnosis not present

## 2017-02-24 DIAGNOSIS — M25551 Pain in right hip: Secondary | ICD-10-CM | POA: Diagnosis present

## 2017-02-24 DIAGNOSIS — G8921 Chronic pain due to trauma: Secondary | ICD-10-CM

## 2017-02-24 DIAGNOSIS — Z79899 Other long term (current) drug therapy: Secondary | ICD-10-CM | POA: Diagnosis not present

## 2017-02-24 DIAGNOSIS — G894 Chronic pain syndrome: Secondary | ICD-10-CM

## 2017-02-24 DIAGNOSIS — M62838 Other muscle spasm: Secondary | ICD-10-CM

## 2017-02-24 DIAGNOSIS — M4647 Discitis, unspecified, lumbosacral region: Secondary | ICD-10-CM | POA: Insufficient documentation

## 2017-02-24 DIAGNOSIS — G8929 Other chronic pain: Secondary | ICD-10-CM

## 2017-02-24 DIAGNOSIS — Z5181 Encounter for therapeutic drug level monitoring: Secondary | ICD-10-CM | POA: Diagnosis not present

## 2017-02-24 DIAGNOSIS — M5416 Radiculopathy, lumbar region: Secondary | ICD-10-CM | POA: Diagnosis not present

## 2017-02-24 DIAGNOSIS — R0781 Pleurodynia: Secondary | ICD-10-CM | POA: Diagnosis not present

## 2017-02-24 MED ORDER — DICLOFENAC EPOLAMINE 1.3 % TD PTCH: MEDICATED_PATCH | TRANSDERMAL | 2 refills | Status: DC

## 2017-02-24 MED ORDER — HYDROCODONE-ACETAMINOPHEN 10-325 MG PO TABS: 1.0000 | ORAL_TABLET | Freq: Four times a day (QID) | ORAL | 0 refills | Status: DC | PRN

## 2017-02-24 MED ORDER — TRAZODONE HCL 100 MG PO TABS: ORAL_TABLET | ORAL | 2 refills | Status: DC

## 2017-02-24 NOTE — Progress Notes (Signed)
Subjective:    Patient ID: Matthew Sloan, male    DOB: Nov 20, 1960, 56 y.o.   MRN: [id number]  HPI: [name] is a 56year old male who returns for follow up appointmentfor chronic pain and medication refill.He states his pain is located in his lower back radiating into his right groin, right hip and right lower extremity laterally.Also states he has generalized pain all over.He rates his pain 9. His current exercise regime is walking and performing stretching exercises using bands and balls daily.   Last UDS was on 11/09/2016,it was consistent.   Pain Inventory Average Pain 8 Pain Right Now 9 My pain is constant, sharp, burning, stabbing, tingling and aching  In the last 24 hours, has pain interfered with the following? General activity 10 Relation with others 8 Enjoyment of life 8 What TIME of day is your pain at its worst? all Sleep (in general) Poor  Pain is worse with: walking, bending, sitting, inactivity and standing Pain improves with: medication Relief from Meds: 2  Mobility walk with assistance use a cane how many minutes can you walk? ? ability to climb steps?  no do you drive?  yes needs help with transfers transfers alone Do you have any goals in this area?  yes  Function disabled: date disabled . Do you have any goals in this area?  yes  Neuro/Psych weakness numbness tremor tingling trouble walking spasms  Prior Studies Any changes since last visit?  no  Physicians involved in your care Any changes since last visit?  no   Family History  Problem Relation Age of Onset  . Cancer Mother 37       breast cancer  . Heart disease Father 28       CHF   Social History   Social History  . Marital status: Single    Spouse name: N/A  . Number of children: N/A  . Years of education: N/A   Social History Main Topics  . Smoking status: Current Every Day Smoker    Packs/day: 1.00    Years: 30.00    Types: Cigarettes  . Smokeless  tobacco: Never Used     Comment: working on it-down to 0.5 ppd  . Alcohol use No  . Drug use: No  . Sexual activity: Not Asked   Other Topics Concern  . None   Social History Narrative  . None   Past Surgical History:  Procedure Laterality Date  . bilateral tubes placed in ears  04/12/2012  . CARPAL TUNNEL RELEASE  12/15/2011   Procedure: CARPAL TUNNEL RELEASE;  Surgeon: [name], MD;  Location: [facility];  Service: Orthopedics;  Laterality: Left;  [name] MULTIPLE TOOTH EXTRACTIONS    . SHOULDER SURGERY  2012   lt  . ULNAR TUNNEL RELEASE  12/15/2011   Procedure: CUBITAL TUNNEL RELEASE;  Surgeon: [name], MD;  Location: [facility];  Service: Orthopedics;  Laterality: Left;  left cubital tunnel release   Past Medical History:  Diagnosis Date  . Allergy   . Chronic pain due to trauma   . Disturbance of skin sensation   . Intercostal neuralgia   . Lumbosacral neuritis   . Myocardial infarction (HCC)   . Rotator cuff (capsule) sprain   . Snores    BP 117/78 (BP Location: Left Arm, Patient Position: Sitting, Cuff Size: Normal)   Pulse 74   Resp 14   SpO2 95%   Opioid Risk Score:  Fall Risk Score:  `1  Depression screen PHQ 2/9  [facility] 01/27/2017 09/09/2016 03/23/2016 02/20/2016 02/01/2015 09/28/2014  Decreased Interest 0 0 0 0 0 3  Down, Depressed, Hopeless 0 0 0 0 0 2  PHQ - 2 Score 0 0 0 0 0 5  Altered sleeping - - - - - 3  Tired, decreased energy - - - - - 3  Feeling bad or failure about yourself  - - - - - 3  Trouble concentrating - - - - - 2  Moving slowly or fidgety/restless - - - - - 2  Suicidal thoughts - - - - - 0  PHQ-9 Score - - - - - 18    Review of Systems  Eyes: Negative.   Respiratory: Negative.   Cardiovascular: Negative.   Gastrointestinal: Negative.   Endocrine: Negative.   Genitourinary: Negative.   Musculoskeletal: Positive for arthralgias, back pain, gait problem and neck pain.       Spasms    Skin: Negative.   Allergic/Immunologic: Negative.   Neurological: Positive for tremors, weakness and numbness.       Tingling  Hematological: Negative.   Psychiatric/Behavioral: Negative.        Objective:   Physical Exam  Constitutional: He is oriented to person, place, and time. He appears well-developed and well-nourished.  HENT:  Head: Normocephalic and atraumatic.  Neck: Normal range of motion. Neck supple.  Cardiovascular: Normal rate and regular rhythm.   Pulmonary/Chest: Effort normal and breath sounds normal.  Musculoskeletal:  Normal Muscle Bulk and Muscle Testing Reveals: Upper Extremities: Right: Decreased ROM 90 Degrees and Muscle Strength 4/5 Left: Decreased ROM 45 Degrees and Muscle Strength 4/5 Thoracic Paraspinal Tenderness: T-5-T-7 Mainly Right Side Lumbar Hypersensitivity Right: Greater Trochanter Tenderness Lower Extremities: Right: Decreased ROM and Muscle Strength 4/5 Right Lower Extremity Flexion Produces Pain into Extremity Left: Full ROM and Muscle Strength 5/5 Arises from chair slowly using straight cane for support Narrow Based gait  Neurological: He is alert and oriented to person, place, and time.  Skin: Skin is warm and dry.  Psychiatric: He has a normal mood and affect.  Nursing note and vitals reviewed.         Assessment & Plan:  1. 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 02/24/2017 Refilled:Hydrocodone 10/325 mg #120 pills--use every 6 hours as needed.  We will continue the opioid monitoring program, this consists of regular clinic visits, examinations, urine drug screen, pill counts as well as use of [place] Controlled Substance reporting System. 2. Lumbar Radiculitis: Continue Gabapentin. 02/24/2017 3. Intercostal neuralgia: Continue Flector Patch, Robaxin and Neurontin. 02/24/2017 4. Insomnia: Continue trazodone. 02/24/2017 5. Muscle  Spasm: Continue Robaxin  20 minutes of face to face patient care time was spent during this visit. All questions were encouraged and answered.  F/U in 1 month

## 2017-03-24 ENCOUNTER — Encounter: Payer: Self-pay | Admitting: Registered Nurse

## 2017-03-24 ENCOUNTER — Encounter: Payer: Medicare HMO | Attending: Physical Medicine & Rehabilitation | Admitting: Registered Nurse

## 2017-03-24 VITALS — BP 118/84 | HR 112

## 2017-03-24 DIAGNOSIS — G894 Chronic pain syndrome: Secondary | ICD-10-CM | POA: Diagnosis not present

## 2017-03-24 DIAGNOSIS — F17210 Nicotine dependence, cigarettes, uncomplicated: Secondary | ICD-10-CM | POA: Diagnosis not present

## 2017-03-24 DIAGNOSIS — Z5181 Encounter for therapeutic drug level monitoring: Secondary | ICD-10-CM

## 2017-03-24 DIAGNOSIS — Z79899 Other long term (current) drug therapy: Secondary | ICD-10-CM

## 2017-03-24 DIAGNOSIS — G8929 Other chronic pain: Secondary | ICD-10-CM | POA: Insufficient documentation

## 2017-03-24 DIAGNOSIS — M4647 Discitis, unspecified, lumbosacral region: Secondary | ICD-10-CM | POA: Insufficient documentation

## 2017-03-24 DIAGNOSIS — G8921 Chronic pain due to trauma: Secondary | ICD-10-CM | POA: Diagnosis not present

## 2017-03-24 DIAGNOSIS — M5416 Radiculopathy, lumbar region: Secondary | ICD-10-CM | POA: Diagnosis not present

## 2017-03-24 DIAGNOSIS — M519 Unspecified thoracic, thoracolumbar and lumbosacral intervertebral disc disorder: Secondary | ICD-10-CM

## 2017-03-24 DIAGNOSIS — M25551 Pain in right hip: Secondary | ICD-10-CM | POA: Insufficient documentation

## 2017-03-24 MED ORDER — HYDROCODONE-ACETAMINOPHEN 10-325 MG PO TABS: 1.0000 | ORAL_TABLET | Freq: Four times a day (QID) | ORAL | 0 refills | Status: DC | PRN

## 2017-03-24 NOTE — Progress Notes (Signed)
Subjective:    Patient ID: Matthew JubaJohn L Sloan, male    DOB: [birth year], 58 y.o.   MRN: [id number]  HPI: [name] is a 56year old male who returns for follow up appointmentfor chronic pain and medication refill.He states his pain is located in his lower back radiating into his right groin, right hip and right lower extremity laterally.Also states he has generalized pain all over.He rates his pain 9. His current exercise regime is walking and performing stretching exercises using bands and balls daily.  Last UDS was on 11/09/2016,it was consistent.   Pain Inventory Average Pain 8 Pain Right Now 9 My pain is constant, sharp, burning, stabbing, tingling and aching  In the last 24 hours, has pain interfered with the following? General activity 9 Relation with others 8 Enjoyment of life 8 What TIME of day is your pain at its worst? all Sleep (in general) Poor  Pain is worse with: walking, bending, sitting, inactivity and standing Pain improves with: medication Relief from Meds: 2  Mobility use a cane  Function disabled: date disabled . Do you have any goals in this area?  yes  Neuro/Psych weakness numbness tremor tingling trouble walking spasms  Prior Studies Any changes since last visit?  no  Physicians involved in your care Any changes since last visit?  no   Family History  Problem Relation Age of Onset  . Cancer Mother 5845       breast cancer  . Heart disease Father 5172       CHF   Social History   Social History  . Marital status: Single    Spouse name: N/A  . Number of children: N/A  . Years of education: N/A   Social History Main Topics  . Smoking status: Current Every Day Smoker    Packs/day: 1.00    Years: 30.00    Types: Cigarettes  . Smokeless tobacco: Never Used     Comment: working on it-down to 0.5 ppd  . Alcohol use No  . Drug use: No  . Sexual activity: Not Asked   Other Topics Concern  . None   Social History Narrative    . None   Past Surgical History:  Procedure Laterality Date  . bilateral tubes placed in ears  04/12/2012  . CARPAL TUNNEL RELEASE  12/15/2011   Procedure: CARPAL TUNNEL RELEASE;  Surgeon: Tami RibasKevin R Kuzma, MD;  Location: [facility];  Service: Orthopedics;  Laterality: Left;  [name]. MULTIPLE TOOTH EXTRACTIONS    . SHOULDER SURGERY  2012   lt  . ULNAR TUNNEL RELEASE  12/15/2011   Procedure: CUBITAL TUNNEL RELEASE;  Surgeon: Tami RibasKevin R Kuzma, MD;  Location: [facility];  Service: Orthopedics;  Laterality: Left;  left cubital tunnel release   Past Medical History:  Diagnosis Date  . Allergy   . Chronic pain due to trauma   . Disturbance of skin sensation   . Intercostal neuralgia   . Lumbosacral neuritis   . Myocardial infarction (HCC)   . Rotator cuff (capsule) sprain   . Snores    BP 118/84   Pulse (!) 112   SpO2 95%   Opioid Risk Score:  0 Fall Risk Score:  `1  Depression screen PHQ 2/9  Dep[facility] 03/24/2017 01/27/2017 09/09/2016 03/23/2016 02/20/2016 02/01/2015 09/28/2014  Decreased Interest 0 0 0 0 0 0 3  Down, Depressed, Hopeless 0 0 0 0 0 0 2  PHQ - 2 Score  0 0 0 0 0 0 5  Altered sleeping - - - - - - 3  Tired, decreased energy - - - - - - 3  Feeling bad or failure about yourself  - - - - - - 3  Trouble concentrating - - - - - - 2  Moving slowly or fidgety/restless - - - - - - 2  Suicidal thoughts - - - - - - 0  PHQ-9 Score - - - - - - 18    Review of Systems  HENT: Negative.   Eyes: Negative.   Respiratory: Negative.   Cardiovascular: Negative.   Gastrointestinal: Negative.   Endocrine: Negative.   Genitourinary: Negative.   Musculoskeletal: Positive for arthralgias, back pain, gait problem and neck pain.       Spasms  Skin: Negative.   Allergic/Immunologic: Negative.   Neurological: Positive for tremors, weakness and numbness.       Tingling  Hematological: Negative.   Psychiatric/Behavioral: Negative.   All other systems  reviewed and are negative.      Objective:   Physical Exam  Constitutional: He is oriented to person, place, and time. He appears well-developed and well-nourished.  HENT:  Head: Normocephalic and atraumatic.  Neck: Normal range of motion. Neck supple.  Cardiovascular: Normal rate and regular rhythm.   Pulmonary/Chest: Effort normal and breath sounds normal.  Musculoskeletal:  Normal Muscle Bulk and Muscle Testing Reveals: Upper Extremities: Right: Decreased ROM 90 Degrees and Muscle Strength 4/5 Left: Decreased  ROM 45 Degrees and Muscle Strength 4/5 Thoracic Paraspinal Tenderness: T-5-T-7 Mainly Right Side Lumbar Hypersensitivity Right: Greater Trochanter Tenderness Lower Extremities: Right: Decreased ROM and Muscle Strength 4/5 Right Lower Extremity Flexion Produces Pain into Lower Extremity Left: Full  ROM and Muscle Strength 5/5 Arises from chair slowly using straight cane for support Narrow Based gait  Neurological: He is alert and oriented to person, place, and time.  Skin: Skin is warm and dry.  Psychiatric: He has a normal mood and affect.  Nursing note and vitals reviewed.         Assessment & Plan:  1. 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 03/24/2017 Refilled:Hydrocodone 10/325 mg #120 pills--use every 6 hours as needed.  We will continue the opioid monitoring program, this consists of regular clinic visits, examinations, urine drug screen, pill counts as well as use of [place] Controlled Substance reporting System. 2. Lumbar Radiculitis: Continue Gabapentin. 03/24/2017 3. Intercostal neuralgia: Continue Flector Patch, Robaxin and Neurontin. 03/24/2017 4. Insomnia: Continue trazodone. 03/24/2017 5. Muscle Spasm: Continue Robaxin. 03/24/2017  20 minutes of face to face patient care time was spent during this visit. All questions were encouraged and answered.  F/U  in 1 month

## 2017-04-22 ENCOUNTER — Encounter: Payer: Self-pay | Admitting: Registered Nurse

## 2017-04-22 ENCOUNTER — Telehealth: Payer: Self-pay | Admitting: Registered Nurse

## 2017-04-22 ENCOUNTER — Encounter: Payer: Medicare HMO | Attending: Physical Medicine & Rehabilitation | Admitting: Registered Nurse

## 2017-04-22 VITALS — BP 139/63 | HR 96

## 2017-04-22 DIAGNOSIS — G894 Chronic pain syndrome: Secondary | ICD-10-CM | POA: Diagnosis not present

## 2017-04-22 DIAGNOSIS — G8921 Chronic pain due to trauma: Secondary | ICD-10-CM | POA: Diagnosis not present

## 2017-04-22 DIAGNOSIS — G8929 Other chronic pain: Secondary | ICD-10-CM

## 2017-04-22 DIAGNOSIS — M5416 Radiculopathy, lumbar region: Secondary | ICD-10-CM

## 2017-04-22 DIAGNOSIS — Z5181 Encounter for therapeutic drug level monitoring: Secondary | ICD-10-CM | POA: Diagnosis not present

## 2017-04-22 DIAGNOSIS — F17210 Nicotine dependence, cigarettes, uncomplicated: Secondary | ICD-10-CM | POA: Insufficient documentation

## 2017-04-22 DIAGNOSIS — M25551 Pain in right hip: Secondary | ICD-10-CM | POA: Diagnosis present

## 2017-04-22 DIAGNOSIS — M519 Unspecified thoracic, thoracolumbar and lumbosacral intervertebral disc disorder: Secondary | ICD-10-CM | POA: Diagnosis not present

## 2017-04-22 DIAGNOSIS — M4647 Discitis, unspecified, lumbosacral region: Secondary | ICD-10-CM | POA: Diagnosis not present

## 2017-04-22 DIAGNOSIS — Z79899 Other long term (current) drug therapy: Secondary | ICD-10-CM | POA: Diagnosis not present

## 2017-04-22 MED ORDER — DICLOFENAC SODIUM 1 % TD GEL: 2.0000 g | Freq: Three times a day (TID) | TRANSDERMAL | 4 refills | Status: DC

## 2017-04-22 MED ORDER — HYDROCODONE-ACETAMINOPHEN 10-325 MG PO TABS: 1.0000 | ORAL_TABLET | Freq: Four times a day (QID) | ORAL | 0 refills | Status: DC | PRN

## 2017-04-22 MED ORDER — METHOCARBAMOL 500 MG PO TABS: 500.0000 mg | ORAL_TABLET | Freq: Three times a day (TID) | ORAL | 5 refills | Status: DC

## 2017-04-22 NOTE — Telephone Encounter (Signed)
On 04/22/2017 the  NCCSR was reviewed no conflict was seen on the [place] Controlled Substance Reporting System with multiple prescribers. Matthew Sloan has a signed narcotic contract with our office. If there were any discrepancies this would have been reported to hisphysician.

## 2017-04-22 NOTE — Progress Notes (Signed)
Subjective:    Patient ID: Matthew Sloan, male    DOB: [birth year], 58 y.o.   MRN: [id number]  HPI: [name] is a 56year old male who returns for follow up appointmentfor chronic pain and medication refill.He states his pain is located in his lower back radiating into his right hip and right lower extremity laterally.Also states he has generalized pain all over.He rates his pain 9. Also reports his pain has increased in intensity with weather changes. His current exercise regime is walking and performing stretching exercises using bands and balls daily.  [name] Morphine equivalent is 40.00 MME.  Last UDS was on 11/09/2016,it was consistent. Oral Swab Performed today.   Pain Inventory Average Pain 8 Pain Right Now 9 My pain is constant, sharp, burning, stabbing, tingling and aching  In the last 24 hours, has pain interfered with the following? General activity 9 Relation with others 8 Enjoyment of life 8 What TIME of day is your pain at its worst? all Sleep (in general) Poor  Pain is worse with: walking, bending, sitting, inactivity and standing Pain improves with: medication Relief from Meds: 2  Mobility use a cane  Function disabled: date disabled . Do you have any goals in this area?  yes  Neuro/Psych weakness numbness tremor tingling trouble walking spasms  Prior Studies Any changes since last visit?  no  Physicians involved in your care Any changes since last visit?  no   Family History  Problem Relation Age of Onset  . Cancer Mother 71       breast cancer  . Heart disease Father 74       CHF   Social History   Social History  . Marital status: Single    Spouse name: N/A  . Number of children: N/A  . Years of education: N/A   Social History Main Topics  . Smoking status: Current Every Day Smoker    Packs/day: 1.00    Years: 30.00    Types: Cigarettes  . Smokeless tobacco: Never Used     Comment: working on it-down to 0.5  ppd  . Alcohol use No  . Drug use: No  . Sexual activity: Not Asked   Other Topics Concern  . None   Social History Narrative  . None   Past Surgical History:  Procedure Laterality Date  . bilateral tubes placed in ears  04/12/2012  . CARPAL TUNNEL RELEASE  12/15/2011   Procedure: CARPAL TUNNEL RELEASE;  Surgeon: [name], MD;  Location: [facility];  Service: Orthopedics;  Laterality: Left;  [name] MULTIPLE TOOTH EXTRACTIONS    . SHOULDER SURGERY  2012   lt  . ULNAR TUNNEL RELEASE  12/15/2011   Procedure: CUBITAL TUNNEL RELEASE;  Surgeon: [name], MD;  Location: [facility];  Service: Orthopedics;  Laterality: Left;  left cubital tunnel release   Past Medical History:  Diagnosis Date  . Allergy   . Chronic pain due to trauma   . Disturbance of skin sensation   . Intercostal neuralgia   . Lumbosacral neuritis   . Myocardial infarction (HCC)   . Rotator cuff (capsule) sprain   . Snores    BP 139/63 (BP Location: Left Arm, Patient Position: Sitting, Cuff Size: Normal)   Pulse 96   SpO2 95%   Opioid Risk Score:  0 Fall Risk Score:  `1  Depression screen PHQ 2/9  Depression screen [facility] 2/9 03/24/2017 01/27/2017 09/09/2016 03/23/2016  02/20/2016 02/01/2015 09/28/2014  Decreased Interest 0 0 0 0 0 0 3  Down, Depressed, Hopeless 0 0 0 0 0 0 2  PHQ - 2 Score 0 0 0 0 0 0 5  Altered sleeping - - - - - - 3  Tired, decreased energy - - - - - - 3  Feeling bad or failure about yourself  - - - - - - 3  Trouble concentrating - - - - - - 2  Moving slowly or fidgety/restless - - - - - - 2  Suicidal thoughts - - - - - - 0  PHQ-9 Score - - - - - - 18    Review of Systems  HENT: Negative.   Eyes: Negative.   Respiratory: Negative.   Cardiovascular: Negative.   Gastrointestinal: Negative.   Endocrine: Negative.   Genitourinary: Negative.   Musculoskeletal: Positive for arthralgias, back pain, gait problem and neck pain.       Spasms  Skin: Negative.     Allergic/Immunologic: Negative.   Neurological: Positive for tremors, weakness and numbness.       Tingling  Hematological: Negative.   Psychiatric/Behavioral: Negative.   All other systems reviewed and are negative.      Objective:   Physical Exam  Constitutional: He is oriented to person, place, and time. He appears well-developed and well-nourished.  HENT:  Head: Normocephalic and atraumatic.  Neck: Normal range of motion. Neck supple.  Cardiovascular: Normal rate and regular rhythm.   Pulmonary/Chest: Effort normal and breath sounds normal.  Musculoskeletal:  Normal Muscle Bulk and Muscle Testing Reveals: Upper Extremities: Right: Decreased ROM 90 Degrees and Muscle Strength 4/5 Left: Decreased  ROM 45 Degrees and Muscle Strength 4/5 Thoracic Hypersensitivity  Lumbar Hypersensitivity Right: Greater Trochanter Tenderness Lower Extremities: Right: Decreased ROM and Muscle Strength 4/5 Right Lower Extremity Flexion Produces Pain into Lumbar, Right Hip and Right  Lower Extremity Left: Full  ROM and Muscle Strength 5/5 Arises from chair slowly using straight cane for support Narrow Based gait  Neurological: He is alert and oriented to person, place, and time.  Skin: Skin is warm and dry.  Psychiatric: He has a normal mood and affect.  Nursing note and vitals reviewed.         Assessment & Plan:  1. 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 04/22/2017 Refilled:Hydrocodone 10/325 mg #120 pills--use every 6 hours as needed.  We will continue the opioid monitoring program, this consists of regular clinic visits, examinations, urine drug screen, pill counts as well as use of [place] Controlled Substance reporting System. 2. Lumbar Radiculitis: Continue Gabapentin. 04/22/2017 3. Intercostal neuralgia: Unable to continue Flector Patch due to cost, will prescribe Voltaren Gel, Robaxin  and Neurontin. 04/22/2017 4. Insomnia: Continue trazodone. 04/22/2017 5. Muscle Spasm: Continue Robaxin. 04/22/2017  20 minutes of face to face patient care time was spent during this visit. All questions were encouraged and answered.  F/U in 1 month

## 2017-04-29 LAB — DRUG TOX MONITOR 1 W/CONF, ORAL FLD
Amphetamines: NEGATIVE ng/mL (ref <10)
BUPRENORPHINE: NEGATIVE ng/mL (ref <0.025)
Barbiturates: NEGATIVE ng/mL (ref <10)
Benzodiazepines: NEGATIVE ng/mL (ref <0.50)
COCAINE: NEGATIVE ng/mL (ref <2.5)
Codeine: NEGATIVE ng/mL (ref <2.5)
Cotinine: 126.8 ng/mL — ABNORMAL HIGH (ref <5.0)
DIHYDROCODEINE: 16.2 ng/mL — AB (ref <2.5)
FENTANYL: NEGATIVE ng/mL (ref <0.10)
HEROIN METABOLITE: NEGATIVE ng/mL (ref <1.0)
HYDROCODONE: 237.6 ng/mL — AB (ref <2.5)
HYDROMORPHONE: NEGATIVE ng/mL (ref <2.5)
MARIJUANA: NEGATIVE ng/mL (ref <2.5)
MDMA: NEGATIVE ng/mL (ref <10)
MEPERIDINE: NEGATIVE ng/mL (ref <5.0)
MEPROBAMATE: NEGATIVE ng/mL (ref <2.5)
Methadone: NEGATIVE ng/mL (ref <5.0)
Morphine: NEGATIVE ng/mL (ref <2.5)
NORHYDROCODONE: 4.2 ng/mL — AB (ref <2.5)
Nicotine Metabolite: POSITIVE ng/mL — AB (ref <5.0)
Noroxycodone: NEGATIVE ng/mL (ref <2.5)
OPIATES: POSITIVE ng/mL — AB (ref <2.5)
OXYMORPHONE: NEGATIVE ng/mL (ref <2.5)
Oxycodone: NEGATIVE ng/mL (ref <2.5)
PHENCYCLIDINE: NEGATIVE ng/mL (ref <10)
Propoxyphene: NEGATIVE ng/mL (ref <5.0)
TAPENTADOL: NEGATIVE ng/mL (ref <5.0)
TRAMADOL: NEGATIVE ng/mL (ref <5.0)
ZOLPIDEM: NEGATIVE ng/mL (ref <5.0)

## 2017-04-29 LAB — DRUG TOX ALC METAB W/CON, ORAL FLD: ALCOHOL METABOLITE: NEGATIVE ng/mL (ref <25)

## 2017-05-03 ENCOUNTER — Telehealth: Payer: Self-pay | Admitting: *Deleted

## 2017-05-03 NOTE — Telephone Encounter (Signed)
Oral swab drug screen was consistent for prescribed medications.  ?

## 2017-05-24 ENCOUNTER — Encounter: Payer: Self-pay | Admitting: Physical Medicine & Rehabilitation

## 2017-05-24 ENCOUNTER — Encounter: Payer: Medicare HMO | Attending: Physical Medicine & Rehabilitation

## 2017-05-24 ENCOUNTER — Ambulatory Visit: Payer: Medicare HMO | Admitting: Physical Medicine & Rehabilitation

## 2017-05-24 ENCOUNTER — Other Ambulatory Visit: Payer: Self-pay

## 2017-05-24 VITALS — BP 159/77 | HR 86

## 2017-05-24 DIAGNOSIS — F17210 Nicotine dependence, cigarettes, uncomplicated: Secondary | ICD-10-CM | POA: Insufficient documentation

## 2017-05-24 DIAGNOSIS — G8929 Other chronic pain: Secondary | ICD-10-CM

## 2017-05-24 DIAGNOSIS — G8921 Chronic pain due to trauma: Secondary | ICD-10-CM | POA: Insufficient documentation

## 2017-05-24 DIAGNOSIS — M5416 Radiculopathy, lumbar region: Secondary | ICD-10-CM | POA: Diagnosis not present

## 2017-05-24 DIAGNOSIS — M4647 Discitis, unspecified, lumbosacral region: Secondary | ICD-10-CM | POA: Diagnosis not present

## 2017-05-24 DIAGNOSIS — M25551 Pain in right hip: Secondary | ICD-10-CM | POA: Insufficient documentation

## 2017-05-24 MED ORDER — HYDROCODONE-ACETAMINOPHEN 10-325 MG PO TABS: 1.0000 | ORAL_TABLET | Freq: Four times a day (QID) | ORAL | 0 refills | Status: DC | PRN

## 2017-05-24 MED ORDER — GABAPENTIN 800 MG PO TABS: 800.0000 mg | ORAL_TABLET | Freq: Three times a day (TID) | ORAL | 5 refills | Status: DC

## 2017-05-24 NOTE — Progress Notes (Addendum)
Subjective:    Patient ID: Matthew Sloan, male    DOB: 1961/05/02, 56 y.o.   MRN: [id number]  HPI Quitting smoking down to 1/2 ppd.  Girlfriend recently died of lung cancer.   Right hip pain, low back pain as well as chronic R S1 radicular pain.  Also has Left shoulder pain WC case still not settled . Cont to f/u on BP with PCP and cardiology Remains on plavix, pravastatin and metoprolol Pain Inventory Average Pain 8 Pain Right Now 8 My pain is sharp, burning, stabbing, tingling and aching  In the last 24 hours, has pain interfered with the following? General activity 10 Relation with others 8 Enjoyment of life 8 What TIME of day is your pain at its worst? all Sleep (in general) Poor  Pain is worse with: walking, bending, sitting, inactivity and standing Pain improves with: medication Relief from Meds: 2  Mobility walk with assistance use a cane ability to climb steps?  no do you drive?  yes  Function retired  Neuro/Psych weakness numbness tremor tingling trouble walking spasms  Prior Studies Any changes since last visit?  no  Physicians involved in your care Any changes since last visit?  no   Family History  Problem Relation Age of Onset  . Cancer Mother [id number]       breast cancer  . Heart disease Father [phone]       CHF   Social History   Socioeconomic History  . Marital status: Single    Spouse name: Not on file  . Number of children: Not on file  . Years of education: Not on file  . Highest education level: Not on file  Social Needs  . Financial resource strain: Not on file  . Food insecurity - worry: Not on file  . Food insecurity - inability: Not on file  . Transportation needs - medical: Not on file  . Transportation needs - non-medical: Not on file  Occupational History  . Not on file  Tobacco Use  . Smoking status: Current Every Day Smoker    Packs/day: 1.00    Years: 30.00    Pack years: 30.00    Types: Cigarettes  . Smokeless  tobacco: Never Used  . Tobacco comment: working on it-down to 0.5 ppd  Substance and Sexual Activity  . Alcohol use: No  . Drug use: No  . Sexual activity: Not on file  Other Topics Concern  . Not on file  Social History Narrative  . Not on file   Past Surgical History:  Procedure Laterality Date  . bilateral tubes placed in ears  04/12/2012  . MULTIPLE TOOTH EXTRACTIONS    . SHOULDER SURGERY  2012   lt   Past Medical History:  Diagnosis Date  . Allergy   . Chronic pain due to trauma   . Disturbance of skin sensation   . Intercostal neuralgia   . Lumbosacral neuritis   . Myocardial infarction (HCC)   . Rotator cuff (capsule) sprain   . Snores    There were no vitals taken for this visit.  Opioid Risk Score:   Fall Risk Score:  `1  Depression screen PHQ 2/9  [facility] 03/24/2017 01/27/2017 09/09/2016 03/23/2016 02/20/2016 02/01/2015 09/28/2014  Decreased Interest 0 0 0 0 0 0 3  Down, Depressed, Hopeless 0 0 0 0 0 0 2  PHQ - 2 Score 0 0 0 0 0 0 5  Altered sleeping - - - - - -  3  Tired, decreased energy - - - - - - 3  Feeling bad or failure about yourself  - - - - - - 3  Trouble concentrating - - - - - - 2  Moving slowly or fidgety/restless - - - - - - 2  Suicidal thoughts - - - - - - 0  PHQ-9 Score - - - - - - 18     Review of Systems  Constitutional: Negative.   HENT: Negative.   Eyes: Negative.   Respiratory: Negative.   Cardiovascular: Negative.   Gastrointestinal: Negative.   Endocrine: Negative.   Genitourinary: Negative.   Musculoskeletal: Negative.   Skin: Negative.   Allergic/Immunologic: Negative.   Neurological: Negative.   Hematological: Negative.   Psychiatric/Behavioral: Negative.   All other systems reviewed and are negative.      Objective:   Physical Exam  Constitutional: He appears well-developed and well-nourished.  HENT:  Head: Normocephalic and atraumatic.  Eyes: Conjunctivae and EOM are normal. Pupils are equal,  round, and reactive to light.  Psychiatric: His affect is labile.  Crying , grieving  Nursing note and vitals reviewed. Motor strength is 5/5 in the right deltoid bicep tricep grip 3 at the left deltoid secondary to pain 5 at the bicep tricep grip on the left side. 4/5 right hip flexion knee extension and close flexion 5/5 left hip flexion extension ankle dorsi flexion There is pain with internal greater than external rotation of the right hip. Negative straight leg raising Ambulates with cane        Assessment & Plan:  #1.  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  I recommend an x-ray to further evaluate however the patient states that he needs to get some financial matters straightened out before he can have any more testing done. Continue hydrocodone 10 mg 4 times daily  2.  Chronic right S1 radiculopathy Continue gabapentin 800 mg 3 times daily  3.  Insomnia related to chronic pain continue trazodone 100 mg nightly  #4.  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 No evidence of multiple prescribers

## 2017-05-24 NOTE — Patient Instructions (Signed)
May need Right hip xray

## 2017-06-22 ENCOUNTER — Encounter: Payer: Medicare HMO | Admitting: Registered Nurse

## 2017-06-25 ENCOUNTER — Other Ambulatory Visit: Payer: Self-pay

## 2017-06-25 ENCOUNTER — Encounter: Payer: Self-pay | Admitting: Registered Nurse

## 2017-06-25 ENCOUNTER — Encounter: Payer: Medicare HMO | Attending: Physical Medicine & Rehabilitation | Admitting: Registered Nurse

## 2017-06-25 VITALS — BP 143/87 | HR 94

## 2017-06-25 DIAGNOSIS — M4647 Discitis, unspecified, lumbosacral region: Secondary | ICD-10-CM | POA: Insufficient documentation

## 2017-06-25 DIAGNOSIS — G8929 Other chronic pain: Secondary | ICD-10-CM | POA: Diagnosis not present

## 2017-06-25 DIAGNOSIS — M25551 Pain in right hip: Secondary | ICD-10-CM | POA: Diagnosis not present

## 2017-06-25 DIAGNOSIS — Z5181 Encounter for therapeutic drug level monitoring: Secondary | ICD-10-CM

## 2017-06-25 DIAGNOSIS — M62838 Other muscle spasm: Secondary | ICD-10-CM | POA: Diagnosis not present

## 2017-06-25 DIAGNOSIS — G4709 Other insomnia: Secondary | ICD-10-CM | POA: Diagnosis not present

## 2017-06-25 DIAGNOSIS — M519 Unspecified thoracic, thoracolumbar and lumbosacral intervertebral disc disorder: Secondary | ICD-10-CM

## 2017-06-25 DIAGNOSIS — F17210 Nicotine dependence, cigarettes, uncomplicated: Secondary | ICD-10-CM | POA: Insufficient documentation

## 2017-06-25 DIAGNOSIS — G8921 Chronic pain due to trauma: Secondary | ICD-10-CM | POA: Diagnosis not present

## 2017-06-25 DIAGNOSIS — Z79899 Other long term (current) drug therapy: Secondary | ICD-10-CM | POA: Diagnosis not present

## 2017-06-25 DIAGNOSIS — G894 Chronic pain syndrome: Secondary | ICD-10-CM

## 2017-06-25 DIAGNOSIS — M5416 Radiculopathy, lumbar region: Secondary | ICD-10-CM | POA: Insufficient documentation

## 2017-06-25 MED ORDER — HYDROCODONE-ACETAMINOPHEN 10-325 MG PO TABS: 1.0000 | ORAL_TABLET | Freq: Four times a day (QID) | ORAL | 0 refills | Status: DC | PRN

## 2017-06-25 NOTE — Progress Notes (Signed)
Subjective:    Patient ID: Matthew Sloan, male    DOB: April 15, 1961, 56 y.o.   MRN: [id number]  HPI: [name] is a 56year old male who returns for follow up appointmentfor chronic pain and medication refill.He states his pain is located in his lower back radiating into his right hip and right lower extremity laterally.Also reports he has generalized pain all over.He rates his pain 9. His current exercise regime is walking and performing stretching exercises using bands and balls daily.  Matthew Sloan Morphine equivalent is 40.00 MME.  Oral Swab was performed on 04/22/2017,it was consistent.   Pain Inventory Average Pain 8 Pain Right Now 9 My pain is constant, sharp, burning, stabbing, tingling and aching  In the last 24 hours, has pain interfered with the following? General activity 10 Relation with others 8 Enjoyment of life 8 What TIME of day is your pain at its worst? all Sleep (in general) Poor  Pain is worse with: walking, bending, sitting, inactivity and standing Pain improves with: medication Relief from Meds: 2  Mobility use a cane  Function disabled: date disabled . Do you have any goals in this area?  yes  Neuro/Psych weakness numbness tremor tingling trouble walking spasms  Prior Studies Any changes since last visit?  no  Physicians involved in your care Any changes since last visit?  no   Family History  Problem Relation Age of Onset  . Cancer Mother [id number]       breast cancer  . Heart disease Father [phone]       CHF   Social History   Socioeconomic History  . Marital status: Single    Spouse name: None  . Number of children: None  . Years of education: None  . Highest education level: None  Social Needs  . Financial resource strain: None  . Food insecurity - worry: None  . Food insecurity - inability: None  . Transportation needs - medical: None  . Transportation needs - non-medical: None  Occupational History  . None  Tobacco  Use  . Smoking status: Current Every Day Smoker    Packs/day: 1.00    Years: 30.00    Pack years: 30.00    Types: Cigarettes  . Smokeless tobacco: Never Used  . Tobacco comment: working on it-down to 0.5 ppd  Substance and Sexual Activity  . Alcohol use: No  . Drug use: No  . Sexual activity: None  Other Topics Concern  . None  Social History Narrative  . None   Past Surgical History:  Procedure Laterality Date  . bilateral tubes placed in ears  04/12/2012  . CARPAL TUNNEL RELEASE  12/15/2011   Procedure: CARPAL TUNNEL RELEASE;  Surgeon: Matthew RibasKevin R Kuzma, MD;  Location: [facility];  Service: Orthopedics;  Laterality: Left;  [name]. MULTIPLE TOOTH EXTRACTIONS    . SHOULDER SURGERY  2012   lt  . ULNAR TUNNEL RELEASE  12/15/2011   Procedure: CUBITAL TUNNEL RELEASE;  Surgeon: Matthew RibasKevin R Kuzma, MD;  Location: [facility];  Service: Orthopedics;  Laterality: Left;  left cubital tunnel release   Past Medical History:  Diagnosis Date  . Allergy   . Chronic pain due to trauma   . Disturbance of skin sensation   . Intercostal neuralgia   . Lumbosacral neuritis   . Myocardial infarction (HCC)   . Rotator cuff (capsule) sprain   . Snores    BP (!) 143/87   Pulse 94  SpO2 95%   Opioid Risk Score:  0 Fall Risk Score:  `1  Depression screen PHQ 2/9  [facility] 06/25/2017 03/24/2017 01/27/2017 09/09/2016 03/23/2016 02/20/2016 02/01/2015  Decreased Interest 0 0 0 0 0 0 0  Down, Depressed, Hopeless 0 0 0 0 0 0 0  PHQ - 2 Score 0 0 0 0 0 0 0  Altered sleeping - - - - - - -  Tired, decreased energy - - - - - - -  Feeling bad or failure about yourself  - - - - - - -  Trouble concentrating - - - - - - -  Moving slowly or fidgety/restless - - - - - - -  Suicidal thoughts - - - - - - -  PHQ-9 Score - - - - - - -    Review of Systems  HENT: Negative.   Eyes: Negative.   Respiratory: Negative.   Cardiovascular: Negative.   Gastrointestinal: Negative.     Endocrine: Negative.   Genitourinary: Negative.   Musculoskeletal: Positive for arthralgias, back pain, gait problem and neck pain.       Spasms  Skin: Negative.   Allergic/Immunologic: Negative.   Neurological: Positive for tremors, weakness and numbness.       Tingling  Hematological: Negative.   Psychiatric/Behavioral: Negative.   All other systems reviewed and are negative.      Objective:   Physical Exam  Constitutional: He is oriented to person, place, and time. He appears well-developed and well-nourished.  HENT:  Head: Normocephalic and atraumatic.  Neck: Normal range of motion. Neck supple.  Cardiovascular: Normal rate and regular rhythm.  Pulmonary/Chest: Effort normal and breath sounds normal.  Musculoskeletal:  Normal Muscle Bulk and Muscle Testing Reveals: Upper Extremities: Right: Full  ROM  and Muscle Strength 5/5 Left: Decreased  ROM 45 Degrees and Muscle Strength 4/5 Thoracic Paraspinal Tenderness: T-10-T-12 Lumbar Hypersensitivity Right: Greater Trochanter Tenderness Lower Extremities: Right: Decreased ROM and Muscle Strength 4/5 Right Lower Extremity Flexion Produces Pain into Lumbar, Right Hip and Right  Lower Extremity Left: Full  ROM and Muscle Strength 5/5 Arises from chair slowly using straight cane for support Narrow Based gait  Neurological: He is alert and oriented to person, place, and time.  Skin: Skin is warm and dry.  Psychiatric: He has a normal mood and affect.  Nursing note and vitals reviewed.         Assessment & Plan:  1. 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 06/25/2017 Refilled:Hydrocodone 10/325 mg #120 pills--use every 6 hours as needed.  We will continue the opioid monitoring program, this consists of regular clinic visits, examinations, urine drug screen, pill counts as well as use of [place] Controlled Substance reporting  System. 2. Lumbar Radiculitis: Continue Gabapentin. 06/25/2017 3. Intercostal neuralgia: Unable to continue Flector Patch due to cost, will prescribe Voltaren Gel, Robaxin and Neurontin. 06/25/2017 4. Insomnia: Continue trazodone. 06/25/2017 5. Muscle Spasm: Continue Robaxin. 06/25/2017  20 minutes of face to face patient care time was spent during this visit. All questions were encouraged and answered.  F/U in 1 month

## 2017-07-20 ENCOUNTER — Other Ambulatory Visit: Payer: Self-pay

## 2017-07-20 ENCOUNTER — Encounter: Payer: Medicare HMO | Attending: Physical Medicine & Rehabilitation | Admitting: Registered Nurse

## 2017-07-20 ENCOUNTER — Encounter: Payer: Self-pay | Admitting: Registered Nurse

## 2017-07-20 VITALS — BP 117/80 | HR 85

## 2017-07-20 DIAGNOSIS — M62838 Other muscle spasm: Secondary | ICD-10-CM | POA: Diagnosis not present

## 2017-07-20 DIAGNOSIS — M4647 Discitis, unspecified, lumbosacral region: Secondary | ICD-10-CM | POA: Diagnosis not present

## 2017-07-20 DIAGNOSIS — Z5181 Encounter for therapeutic drug level monitoring: Secondary | ICD-10-CM | POA: Diagnosis not present

## 2017-07-20 DIAGNOSIS — G8921 Chronic pain due to trauma: Secondary | ICD-10-CM

## 2017-07-20 DIAGNOSIS — M25551 Pain in right hip: Secondary | ICD-10-CM | POA: Diagnosis not present

## 2017-07-20 DIAGNOSIS — M2550 Pain in unspecified joint: Secondary | ICD-10-CM

## 2017-07-20 DIAGNOSIS — G894 Chronic pain syndrome: Secondary | ICD-10-CM

## 2017-07-20 DIAGNOSIS — M5416 Radiculopathy, lumbar region: Secondary | ICD-10-CM

## 2017-07-20 DIAGNOSIS — Z79899 Other long term (current) drug therapy: Secondary | ICD-10-CM | POA: Diagnosis not present

## 2017-07-20 DIAGNOSIS — G8929 Other chronic pain: Secondary | ICD-10-CM

## 2017-07-20 DIAGNOSIS — F17210 Nicotine dependence, cigarettes, uncomplicated: Secondary | ICD-10-CM | POA: Insufficient documentation

## 2017-07-20 DIAGNOSIS — M519 Unspecified thoracic, thoracolumbar and lumbosacral intervertebral disc disorder: Secondary | ICD-10-CM | POA: Diagnosis not present

## 2017-07-20 DIAGNOSIS — G4709 Other insomnia: Secondary | ICD-10-CM

## 2017-07-20 MED ORDER — HYDROCODONE-ACETAMINOPHEN 10-325 MG PO TABS: 1.0000 | ORAL_TABLET | Freq: Four times a day (QID) | ORAL | 0 refills | Status: DC | PRN

## 2017-07-20 NOTE — Progress Notes (Signed)
Subjective:    Patient ID: Matthew Sloan, male    DOB: 10/30/1960, 57 y.o.   MRN: [id number]  HPI: [name] is a [age]year old male who returns for follow up appointmentfor chronic pain and medication refill.He states his pain is located in his lower back radiating into his right hip and right lower extremity laterally.Also reports he has generalized joint pain all over.He rates his pain 9. His current exercise regime is walking and performing stretching exercises using bands and balls daily.  [name] Morphine equivalent is 38.67 MME.  Oral Swab was performed on 04/22/2017,it was consistent.   Pain Inventory Average Pain 8 Pain Right Now 9 My pain is constant, sharp, burning, stabbing, tingling and aching  In the last 24 hours, has pain interfered with the following? General activity 10 Relation with others 8 Enjoyment of life 8 What TIME of day is your pain at its worst? all Sleep (in general) Poor  Pain is worse with: walking, bending, sitting, inactivity and standing Pain improves with: medication Relief from Meds: 2  Mobility use a cane ability to climb steps?  no do you drive?  yes Do you have any goals in this area?  yes  Function disabled: date disabled . Do you have any goals in this area?  yes  Neuro/Psych weakness numbness tremor tingling trouble walking spasms  Prior Studies Any changes since last visit?  no  Physicians involved in your care Any changes since last visit?  no   Family History  Problem Relation Age of Onset  . Cancer Mother 4345       breast cancer  . Heart disease Father [phone]       CHF   Social History   Socioeconomic History  . Marital status: Single    Spouse name: None  . Number of children: None  . Years of education: None  . Highest education level: None  Social Needs  . Financial resource strain: None  . Food insecurity - worry: None  . Food insecurity - inability: None  . Transportation needs -  medical: None  . Transportation needs - non-medical: None  Occupational History  . None  Tobacco Use  . Smoking status: Current Every Day Smoker    Packs/day: 1.00    Years: 30.00    Pack years: 30.00    Types: Cigarettes  . Smokeless tobacco: Never Used  . Tobacco comment: working on it-down to 0.5 ppd  Substance and Sexual Activity  . Alcohol use: No  . Drug use: No  . Sexual activity: None  Other Topics Concern  . None  Social History Narrative  . None   Past Surgical History:  Procedure Laterality Date  . bilateral tubes placed in ears  04/12/2012  . CARPAL TUNNEL RELEASE  12/15/2011   Procedure: CARPAL TUNNEL RELEASE;  Surgeon: Tami RibasKevin R Kuzma, MD;  Location: [facility];  Service: Orthopedics;  Laterality: Left;  [name]. MULTIPLE TOOTH EXTRACTIONS    . SHOULDER SURGERY  2012   lt  . ULNAR TUNNEL RELEASE  12/15/2011   Procedure: CUBITAL TUNNEL RELEASE;  Surgeon: Tami RibasKevin R Kuzma, MD;  Location: [facility];  Service: Orthopedics;  Laterality: Left;  left cubital tunnel release   Past Medical History:  Diagnosis Date  . Allergy   . Chronic pain due to trauma   . Disturbance of skin sensation   . Intercostal neuralgia   . Lumbosacral neuritis   . Myocardial infarction (  HCC)   . Rotator cuff (capsule) sprain   . Snores    There were no vitals taken for this visit.  Opioid Risk Score:  0 Fall Risk Score:  `1  Depression screen PHQ 2/9  Depression screen [facility] 2/9 07/20/2017 06/25/2017 03/24/2017 01/27/2017 09/09/2016 03/23/2016 02/20/2016  Decreased Interest 0 0 0 0 0 0 0  Down, Depressed, Hopeless 0 0 0 0 0 0 0  PHQ - 2 Score 0 0 0 0 0 0 0  Altered sleeping - - - - - - -  Tired, decreased energy - - - - - - -  Feeling bad or failure about yourself  - - - - - - -  Trouble concentrating - - - - - - -  Moving slowly or fidgety/restless - - - - - - -  Suicidal thoughts - - - - - - -  PHQ-9 Score - - - - - - -    Review of Systems  HENT: Negative.     Eyes: Negative.   Respiratory: Negative.   Cardiovascular: Negative.   Gastrointestinal: Negative.   Endocrine: Negative.   Genitourinary: Negative.   Musculoskeletal: Positive for arthralgias, back pain, gait problem and neck pain.       Spasms  Skin: Negative.   Allergic/Immunologic: Negative.   Neurological: Positive for tremors, weakness and numbness.       Tingling  Hematological: Negative.   Psychiatric/Behavioral: Negative.   All other systems reviewed and are negative.      Objective:   Physical Exam  Constitutional: He is oriented to person, place, and time. He appears well-developed and well-nourished.  HENT:  Head: Normocephalic and atraumatic.  Neck: Normal range of motion. Neck supple.  Cardiovascular: Normal rate and regular rhythm.  Pulmonary/Chest: Effort normal and breath sounds normal.  Musculoskeletal:  Normal Muscle Bulk and Muscle Testing Reveals: Upper Extremities: Right: Full  ROM  and Muscle Strength 4/5 Left: Decreased  ROM 90 Degrees and Muscle Strength 4/5 Lumbar Paraspinal Tenderness: L-3-L-5 Right: Greater Trochanter Tenderness Lower Extremities: Right: Decreased ROM and Muscle Strength 4/5 Right Lower Extremity Flexion Produces Pain into Lumbar, Right Hip and Right  Lower Extremity Left: Full  ROM and Muscle Strength 5/5 Arises from chair slowly using straight cane for support Narrow Based gait  Neurological: He is alert and oriented to person, place, and time.  Skin: Skin is warm and dry.  Psychiatric: He has a normal mood and affect.  Nursing note and vitals reviewed.         Assessment & Plan:  1. 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 07/20/2017 Refilled:Hydrocodone 10/325 mg #120 pills--use every 6 hours as needed.  We will continue the opioid monitoring program, this consists of regular clinic visits, examinations, urine drug screen, pill  counts as well as use of [place] Controlled Substance reporting System. 2. Lumbar Radiculitis: Continue Gabapentin. 07/20/2017 3. Intercostal neuralgia: Unable to continue Flector Patch due to cost, Continue  Voltaren Gel, Robaxin and Neurontin. 07/20/2017 4. Insomnia: Continue trazodone. 07/20/2017 5. Muscle Spasm: Continue Robaxin. 07/20/2017 6. Polyarthralgia: Continue current medication regime. Continue to Monitor. 07/20/2017  20 minutes of face to face patient care time was spent during this visit. All questions were encouraged and answered.  F/U in 1 month

## 2017-08-23 ENCOUNTER — Encounter: Payer: Medicare HMO | Attending: Physical Medicine & Rehabilitation | Admitting: Registered Nurse

## 2017-08-23 ENCOUNTER — Other Ambulatory Visit: Payer: Self-pay

## 2017-08-23 ENCOUNTER — Encounter: Payer: Self-pay | Admitting: Registered Nurse

## 2017-08-23 VITALS — BP 142/73 | HR 75

## 2017-08-23 DIAGNOSIS — G8929 Other chronic pain: Secondary | ICD-10-CM | POA: Insufficient documentation

## 2017-08-23 DIAGNOSIS — M2550 Pain in unspecified joint: Secondary | ICD-10-CM

## 2017-08-23 DIAGNOSIS — Z79899 Other long term (current) drug therapy: Secondary | ICD-10-CM

## 2017-08-23 DIAGNOSIS — G8921 Chronic pain due to trauma: Secondary | ICD-10-CM | POA: Insufficient documentation

## 2017-08-23 DIAGNOSIS — M5416 Radiculopathy, lumbar region: Secondary | ICD-10-CM | POA: Diagnosis not present

## 2017-08-23 DIAGNOSIS — G4709 Other insomnia: Secondary | ICD-10-CM | POA: Diagnosis not present

## 2017-08-23 DIAGNOSIS — M62838 Other muscle spasm: Secondary | ICD-10-CM | POA: Diagnosis not present

## 2017-08-23 DIAGNOSIS — M519 Unspecified thoracic, thoracolumbar and lumbosacral intervertebral disc disorder: Secondary | ICD-10-CM

## 2017-08-23 DIAGNOSIS — M25551 Pain in right hip: Secondary | ICD-10-CM | POA: Diagnosis present

## 2017-08-23 DIAGNOSIS — Z5181 Encounter for therapeutic drug level monitoring: Secondary | ICD-10-CM | POA: Diagnosis not present

## 2017-08-23 DIAGNOSIS — G894 Chronic pain syndrome: Secondary | ICD-10-CM

## 2017-08-23 DIAGNOSIS — F17210 Nicotine dependence, cigarettes, uncomplicated: Secondary | ICD-10-CM | POA: Diagnosis not present

## 2017-08-23 DIAGNOSIS — M4647 Discitis, unspecified, lumbosacral region: Secondary | ICD-10-CM | POA: Insufficient documentation

## 2017-08-23 MED ORDER — HYDROCODONE-ACETAMINOPHEN 10-325 MG PO TABS: 1.0000 | ORAL_TABLET | Freq: Four times a day (QID) | ORAL | 0 refills | Status: DC | PRN

## 2017-08-23 NOTE — Progress Notes (Signed)
Subjective:    Patient ID: Matthew Sloan, male    DOB: [birth year], 57 y.o.   MRN: [id number]  HPI: [name] is a 57year old male who returns for follow up appointmentfor chronic pain and medication refill. He states his pain is located in his left shoulder, lower back radiating into his right hip and right lower extremity. He rates his pain 9. His current exercise regime is performing stretching exercise using bands and walking  Mr. Deegan Morphine equivalent is 38.67 MME.  Oral Swab was performed on 04/22/2017,it was consistent.   Pain Inventory Average Pain 8 Pain Right Now 9 My pain is constant, sharp, burning, stabbing, tingling and aching  In the last 24 hours, has pain interfered with the following? General activity 10 Relation with others 8 Enjoyment of life 8 What TIME of day is your pain at its worst? all Sleep (in general) Poor  Pain is worse with: walking, bending, sitting, inactivity and standing Pain improves with: medication Relief from Meds: 2  Mobility use a cane ability to climb steps?  no do you drive?  yes Do you have any goals in this area?  yes  Function disabled: date disabled . Do you have any goals in this area?  yes  Neuro/Psych weakness numbness tremor tingling trouble walking spasms  Prior Studies Any changes since last visit?  no  Physicians involved in your care Any changes since last visit?  no   Family History  Problem Relation Age of Onset  . Cancer Mother 31       breast cancer  . Heart disease Father 21       CHF   Social History   Socioeconomic History  . Marital status: Single    Spouse name: None  . Number of children: None  . Years of education: None  . Highest education level: None  Social Needs  . Financial resource strain: None  . Food insecurity - worry: None  . Food insecurity - inability: None  . Transportation needs - medical: None  . Transportation needs - non-medical: None    Occupational History  . None  Tobacco Use  . Smoking status: Current Every Day Smoker    Packs/day: 1.00    Years: 30.00    Pack years: 30.00    Types: Cigarettes  . Smokeless tobacco: Never Used  . Tobacco comment: working on it-down to 0.5 ppd  Substance and Sexual Activity  . Alcohol use: No  . Drug use: No  . Sexual activity: None  Other Topics Concern  . None  Social History Narrative  . None   Past Surgical History:  Procedure Laterality Date  . bilateral tubes placed in ears  04/12/2012  . CARPAL TUNNEL RELEASE  12/15/2011   Procedure: CARPAL TUNNEL RELEASE;  Surgeon: [name], MD;  Location: [facility];  Service: Orthopedics;  Laterality: Left;  [name] MULTIPLE TOOTH EXTRACTIONS    . SHOULDER SURGERY  2012   lt  . ULNAR TUNNEL RELEASE  12/15/2011   Procedure: CUBITAL TUNNEL RELEASE;  Surgeon: [name], MD;  Location: [facility];  Service: Orthopedics;  Laterality: Left;  left cubital tunnel release   Past Medical History:  Diagnosis Date  . Allergy   . Chronic pain due to trauma   . Disturbance of skin sensation   . Intercostal neuralgia   . Lumbosacral neuritis   . Myocardial infarction (HCC)   . Rotator cuff (capsule)  sprain   . Snores    BP (!) 142/73   Pulse 75   SpO2 92%   Opioid Risk Score:  0 Fall Risk Score:  `1  Depression screen PHQ 2/9  [facility] 08/23/2017 07/20/2017 06/25/2017 03/24/2017 01/27/2017 09/09/2016 03/23/2016  Decreased Interest 0 0 0 0 0 0 0  Down, Depressed, Hopeless 0 0 0 0 0 0 0  PHQ - 2 Score 0 0 0 0 0 0 0  Altered sleeping - - - - - - -  Tired, decreased energy - - - - - - -  Feeling bad or failure about yourself  - - - - - - -  Trouble concentrating - - - - - - -  Moving slowly or fidgety/restless - - - - - - -  Suicidal thoughts - - - - - - -  PHQ-9 Score - - - - - - -    Review of Systems  HENT: Negative.   Eyes: Negative.   Respiratory: Negative.   Cardiovascular:  Negative.   Gastrointestinal: Negative.   Endocrine: Negative.   Genitourinary: Negative.   Musculoskeletal: Positive for arthralgias, back pain, gait problem and neck pain.       Spasms  Skin: Negative.   Allergic/Immunologic: Negative.   Neurological: Positive for tremors, weakness and numbness.       Tingling  Hematological: Negative.   Psychiatric/Behavioral: Negative.   All other systems reviewed and are negative.      Objective:   Physical Exam  Constitutional: He is oriented to person, place, and time. He appears well-developed and well-nourished.  HENT:  Head: Normocephalic and atraumatic.  Neck: Normal range of motion. Neck supple.  Cardiovascular: Normal rate and regular rhythm.  Pulmonary/Chest: Effort normal and breath sounds normal.  Musculoskeletal:  Normal Muscle Bulk and Muscle Testing Reveals: Upper Extremities: Right: Full ROM  and Muscle Strength 5/5 Left: Decreased  ROM 45 Degrees and Muscle Strength 4/5 Thoracic Paraspinal Tenderness: T-7-T-9 Lumbar Hypersensitivity Right Greater Trochanter Tenderness Lower Extremities: Right: Decreased ROM and Muscle Strength 4/5 Right Lower Extremity Flexion Produces Pain into Lumbar, Right Hip and Right  Lower Extremity Left: Full  ROM and Muscle Strength 5/5 Arises from chair slowly using straight cane for support Narrow Based gait  Neurological: He is alert and oriented to person, place, and time.  Skin: Skin is warm and dry.  Psychiatric: He has a normal mood and affect.  Nursing note and vitals reviewed.         Assessment & Plan:  1. 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 08/23/2017 Refilled:Hydrocodone 10/325 mg #120 pills--use every 6 hours as needed.  We will continue the opioid monitoring program, this consists of regular clinic visits, examinations, urine drug screen, pill counts as well as use of [place]. 2. Lumbar Radiculitis: Continue Gabapentin. 08/23/2017 3. Intercostal neuralgia: Unable to continue Flector Patch due to cost, Continue  Voltaren Gel, Robaxin and Neurontin. 08/23/2017 4. Insomnia: Continue trazodone. 08/23/2017 5. Muscle Spasm: Continue Robaxin. 08/23/2017 6. Polyarthralgia: Continue current medication regime. Continue to Monitor. 08/23/2017 7. Chronic Right Hip Pain: Continue HEP as Tolerated and Continue current medication regimen. 08/23/2017.  20 minutes of face to face patient care time was spent during this visit. All questions were encouraged and answered.  F/U in 1 month

## 2017-09-15 ENCOUNTER — Encounter: Payer: Medicare HMO | Admitting: Registered Nurse

## 2017-09-16 ENCOUNTER — Encounter: Payer: Medicare HMO | Attending: Physical Medicine & Rehabilitation | Admitting: Registered Nurse

## 2017-09-16 ENCOUNTER — Encounter: Payer: Self-pay | Admitting: Registered Nurse

## 2017-09-16 VITALS — BP 127/86 | HR 75 | Resp 14

## 2017-09-16 DIAGNOSIS — M4647 Discitis, unspecified, lumbosacral region: Secondary | ICD-10-CM | POA: Insufficient documentation

## 2017-09-16 DIAGNOSIS — M519 Unspecified thoracic, thoracolumbar and lumbosacral intervertebral disc disorder: Secondary | ICD-10-CM | POA: Diagnosis not present

## 2017-09-16 DIAGNOSIS — F17210 Nicotine dependence, cigarettes, uncomplicated: Secondary | ICD-10-CM | POA: Insufficient documentation

## 2017-09-16 DIAGNOSIS — Z5181 Encounter for therapeutic drug level monitoring: Secondary | ICD-10-CM

## 2017-09-16 DIAGNOSIS — G8929 Other chronic pain: Secondary | ICD-10-CM

## 2017-09-16 DIAGNOSIS — M5416 Radiculopathy, lumbar region: Secondary | ICD-10-CM | POA: Diagnosis present

## 2017-09-16 DIAGNOSIS — M62838 Other muscle spasm: Secondary | ICD-10-CM | POA: Diagnosis not present

## 2017-09-16 DIAGNOSIS — G8921 Chronic pain due to trauma: Secondary | ICD-10-CM | POA: Diagnosis not present

## 2017-09-16 DIAGNOSIS — Z79891 Long term (current) use of opiate analgesic: Secondary | ICD-10-CM | POA: Diagnosis not present

## 2017-09-16 DIAGNOSIS — G894 Chronic pain syndrome: Secondary | ICD-10-CM | POA: Diagnosis not present

## 2017-09-16 DIAGNOSIS — M25551 Pain in right hip: Secondary | ICD-10-CM | POA: Diagnosis present

## 2017-09-16 DIAGNOSIS — M25512 Pain in left shoulder: Secondary | ICD-10-CM

## 2017-09-16 MED ORDER — HYDROCODONE-ACETAMINOPHEN 10-325 MG PO TABS: 1.0000 | ORAL_TABLET | Freq: Four times a day (QID) | ORAL | 0 refills | Status: DC | PRN

## 2017-09-16 NOTE — Progress Notes (Signed)
Subjective:    Patient ID: Matthew Sloan, male    DOB: [birth year], 59 y.o.   MRN: [id number]  HPI: [name] is a 57year old male who returns for follow up appointmentfor chronic pain and medication refill. He states his pain is located in his left shoulder, lower back radiating into his  right hip and right lower extremity. Also reports generalized pain all over and states his pain has increased in intensity with weather.  He rates his pain 9. His current exercise regime is performing stretching exercise using bands and walking  Mr. [name] Morphine equivalent is 38.67 MME.  Oral Swab was performed on 04/22/2017,it was consistent. UDS ordered Today.  Pain Inventory Average Pain 8 Pain Right Now 9 My pain is constant, sharp, burning, stabbing, tingling and aching  In the last 24 hours, has pain interfered with the following? General activity 10 Relation with others 8 Enjoyment of life 8 What TIME of day is your pain at its worst? all Sleep (in general) Poor  Pain is worse with: walking, bending, sitting, inactivity and standing Pain improves with: medication Relief from Meds: 2  Mobility walk with assistance use a cane ability to climb steps?  no do you drive?  yes Do you have any goals in this area?  yes  Function disabled: date disabled . Do you have any goals in this area?  yes  Neuro/Psych weakness numbness tremor tingling trouble walking spasms dizziness  Prior Studies Any changes since last visit?  no  Physicians involved in your care Any changes since last visit?  no   Family History  Problem Relation Age of Onset  . Cancer Mother 89       breast cancer  . Heart disease Father 15       CHF   Social History   Socioeconomic History  . Marital status: Single    Spouse name: None  . Number of children: None  . Years of education: None  . Highest education level: None  Social Needs  . Financial resource strain: None  . Food  insecurity - worry: None  . Food insecurity - inability: None  . Transportation needs - medical: None  . Transportation needs - non-medical: None  Occupational History  . None  Tobacco Use  . Smoking status: Current Every Day Smoker    Packs/day: 1.00    Years: 30.00    Pack years: 30.00    Types: Cigarettes  . Smokeless tobacco: Never Used  . Tobacco comment: working on it-down to 0.5 ppd  Substance and Sexual Activity  . Alcohol use: No  . Drug use: No  . Sexual activity: None  Other Topics Concern  . None  Social History Narrative  . None   Past Surgical History:  Procedure Laterality Date  . bilateral tubes placed in ears  04/12/2012  . CARPAL TUNNEL RELEASE  12/15/2011   Procedure: CARPAL TUNNEL RELEASE;  Surgeon: [name], MD;  Location: [facility];  Service: Orthopedics;  Laterality: Left;  [name] MULTIPLE TOOTH EXTRACTIONS    . SHOULDER SURGERY  2012   lt  . ULNAR TUNNEL RELEASE  12/15/2011   Procedure: CUBITAL TUNNEL RELEASE;  Surgeon: [name], MD;  Location: [facility];  Service: Orthopedics;  Laterality: Left;  left cubital tunnel release   Past Medical History:  Diagnosis Date  . Allergy   . Chronic pain due to trauma   . Disturbance of skin  sensation   . Intercostal neuralgia   . Lumbosacral neuritis   . Myocardial infarction (HCC)   . Rotator cuff (capsule) sprain   . Snores    BP 127/86 (BP Location: Left Arm, Patient Position: Sitting, Cuff Size: Normal)   Pulse 75   Resp 14   SpO2 94%   Opioid Risk Score:  0 Fall Risk Score:  `1  Depression screen PHQ 2/9  [facility] 08/23/2017 07/20/2017 06/25/2017 03/24/2017 01/27/2017 09/09/2016 03/23/2016  Decreased Interest 0 0 0 0 0 0 0  Down, Depressed, Hopeless 0 0 0 0 0 0 0  PHQ - 2 Score 0 0 0 0 0 0 0  Altered sleeping - - - - - - -  Tired, decreased energy - - - - - - -  Feeling bad or failure about yourself  - - - - - - -  Trouble concentrating - - - - -  - -  Moving slowly or fidgety/restless - - - - - - -  Suicidal thoughts - - - - - - -  PHQ-9 Score - - - - - - -    Review of Systems  HENT: Negative.   Eyes: Negative.   Respiratory: Negative.   Cardiovascular: Negative.   Gastrointestinal: Negative.   Endocrine: Negative.   Genitourinary: Negative.   Musculoskeletal: Positive for arthralgias, back pain, gait problem and neck pain.       Spasms  Skin: Negative.   Allergic/Immunologic: Negative.   Neurological: Positive for dizziness, tremors, weakness and numbness.       Tingling  Hematological: Negative.   Psychiatric/Behavioral: Negative.   All other systems reviewed and are negative.      Objective:   Physical Exam  Constitutional: He is oriented to person, place, and time. He appears well-developed and well-nourished.  HENT:  Head: Normocephalic and atraumatic.  Neck: Normal range of motion. Neck supple.  Cardiovascular: Normal rate and regular rhythm.  Pulmonary/Chest: Effort normal and breath sounds normal.  Musculoskeletal:  Normal Muscle Bulk and Muscle Testing Reveals: Upper Extremities: Right: Full ROM  and Muscle Strength 5/5 Left: Decreased  ROM 45 Degrees and Muscle Strength 4/5 Left AC Joint Tenderness Thoracic Paraspinal Tenderness: T-1-T-3 Mainl Left Side Lumbar Paraspinal Tenderness: L-3-L-5 RightGreater Trochanter Tenderness Lower Extremities: Right: Decreased ROM and Muscle Strength 4/5 Right Lower Extremity Flexion Produces Pain into Lumbar, Right Hip and Right  Lower Extremity Left: Full ROM and Muscle Strength 5/5 Arises from chair slowly using straight cane for support Antalgic Gait  Neurological: He is alert and oriented to person, place, and time.  Skin: Skin is warm and dry.  Psychiatric: He has a normal mood and affect.  Nursing note and vitals reviewed.         Assessment & Plan:  1. 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 09/16/2017 Refilled:Hydrocodone 10/325 mg #120 pills--use every 6 hours as needed.  We will continue the opioid monitoring program, this consists of regular clinic visits, examinations, urine drug screen, pill counts as well as use of [place]. 2. Lumbar Radiculitis: Continue Gabapentin. 09/16/2017 3. Intercostal neuralgia: Unable to continue Flector Patch due to cost, Continue  Voltaren Gel, Robaxin and Neurontin. 09/16/2017 4. Insomnia: Continue trazodone. 09/16/2017 5. Muscle Spasm: Continue Robaxin. 09/16/2017 6. Polyarthralgia: Continue current medication regime. Continue to Monitor. 09/16/2017 7. Chronic Right Hip Pain: Continue HEP as Tolerated and Continue  current medication regimen. 09/16/2017.  20 minutes of face to face patient care time was spent during this visit. All questions were encouraged and answered.  F/U in 1 month

## 2017-09-23 LAB — TOXASSURE SELECT,+ANTIDEPR,UR

## 2017-09-27 ENCOUNTER — Telehealth: Payer: Self-pay | Admitting: *Deleted

## 2017-09-27 NOTE — Telephone Encounter (Signed)
Urine drug screen for this encounter is consistent for prescribed medication 

## 2017-10-22 ENCOUNTER — Encounter: Payer: Medicare HMO | Admitting: Registered Nurse

## 2017-10-25 ENCOUNTER — Encounter: Payer: Self-pay | Admitting: Registered Nurse

## 2017-10-25 ENCOUNTER — Encounter: Payer: Medicare HMO | Attending: Physical Medicine & Rehabilitation | Admitting: Registered Nurse

## 2017-10-25 VITALS — BP 124/77 | HR 89 | Ht 69.0 in | Wt 203.0 lb

## 2017-10-25 DIAGNOSIS — M5416 Radiculopathy, lumbar region: Secondary | ICD-10-CM | POA: Insufficient documentation

## 2017-10-25 DIAGNOSIS — M519 Unspecified thoracic, thoracolumbar and lumbosacral intervertebral disc disorder: Secondary | ICD-10-CM | POA: Diagnosis not present

## 2017-10-25 DIAGNOSIS — G8921 Chronic pain due to trauma: Secondary | ICD-10-CM

## 2017-10-25 DIAGNOSIS — Z79891 Long term (current) use of opiate analgesic: Secondary | ICD-10-CM | POA: Diagnosis not present

## 2017-10-25 DIAGNOSIS — Z5181 Encounter for therapeutic drug level monitoring: Secondary | ICD-10-CM

## 2017-10-25 DIAGNOSIS — F17210 Nicotine dependence, cigarettes, uncomplicated: Secondary | ICD-10-CM | POA: Diagnosis not present

## 2017-10-25 DIAGNOSIS — M62838 Other muscle spasm: Secondary | ICD-10-CM

## 2017-10-25 DIAGNOSIS — M4647 Discitis, unspecified, lumbosacral region: Secondary | ICD-10-CM | POA: Insufficient documentation

## 2017-10-25 DIAGNOSIS — G8929 Other chronic pain: Secondary | ICD-10-CM | POA: Diagnosis not present

## 2017-10-25 DIAGNOSIS — M2550 Pain in unspecified joint: Secondary | ICD-10-CM | POA: Diagnosis not present

## 2017-10-25 DIAGNOSIS — G4709 Other insomnia: Secondary | ICD-10-CM

## 2017-10-25 DIAGNOSIS — G894 Chronic pain syndrome: Secondary | ICD-10-CM | POA: Diagnosis not present

## 2017-10-25 DIAGNOSIS — M25512 Pain in left shoulder: Secondary | ICD-10-CM | POA: Diagnosis not present

## 2017-10-25 DIAGNOSIS — M25551 Pain in right hip: Secondary | ICD-10-CM | POA: Insufficient documentation

## 2017-10-25 MED ORDER — HYDROCODONE-ACETAMINOPHEN 10-325 MG PO TABS: 1.0000 | ORAL_TABLET | Freq: Four times a day (QID) | ORAL | 0 refills | Status: DC | PRN

## 2017-10-25 NOTE — Progress Notes (Signed)
Subjective:    Patient ID: Matthew Sloan, male    DOB: 11/06/1960, 57 y.o.   MRN: [id number]  HPI: Matthew Sloan is a 57 year old male who returns for follow up appointment for chronic pain and medication refill. He states his pain is located in his bilateral hands,left shoulder, mid-lower back pain radiating into his right hip and right lower extremity. He rates his pain 9. His current exercise regime is performing stretching exercises using bands and walking.   Matthew Sloan Morphine Equivalent is 36.00 MME.  UDS was Performed on 09/16/2017, it was consistent.     Pain Inventory Average Pain 8 Pain Right Now 9 My pain is aching  In the last 24 hours, has pain interfered with the following? General activity 10 Relation with others 8 Enjoyment of life 8 What TIME of day is your pain at its worst? all Sleep (in general) Poor  Pain is worse with: walking, bending, sitting, inactivity and standing Pain improves with: medication Relief from Meds: 2  Mobility use a cane ability to climb steps?  no do you drive?  yes  Function disabled: date disabled .  Neuro/Psych weakness numbness tremor tingling trouble walking spasms  Prior Studies Any changes since last visit?  no  Physicians involved in your care Any changes since last visit?  no   Family History  Problem Relation Age of Onset  . Cancer Mother 81       breast cancer  . Heart disease Father 25       CHF   Social History   Socioeconomic History  . Marital status: Single    Spouse name: Not on file  . Number of children: Not on file  . Years of education: Not on file  . Highest education level: Not on file  Occupational History  . Not on file  Social Needs  . Financial resource strain: Not on file  . Food insecurity:    Worry: Not on file    Inability: Not on file  . Transportation needs:    Medical: Not on file    Non-medical: Not on file  Tobacco Use  . Smoking status: Current Every Day  Smoker    Packs/day: 1.00    Years: 30.00    Pack years: 30.00    Types: Cigarettes  . Smokeless tobacco: Never Used  . Tobacco comment: working on it-down to 0.5 ppd  Substance and Sexual Activity  . Alcohol use: No  . Drug use: No  . Sexual activity: Not on file  Lifestyle  . Physical activity:    Days per week: Not on file    Minutes per session: Not on file  . Stress: Not on file  Relationships  . Social connections:    Talks on phone: Not on file    Gets together: Not on file    Attends religious service: Not on file    Active member of club or organization: Not on file    Attends meetings of clubs or organizations: Not on file    Relationship status: Not on file  Other Topics Concern  . Not on file  Social History Narrative  . Not on file   Past Surgical History:  Procedure Laterality Date  . bilateral tubes placed in ears  04/12/2012  . CARPAL TUNNEL RELEASE  12/15/2011   Procedure: CARPAL TUNNEL RELEASE;  Surgeon: Matthew Ribas, MD;  [facility];  Service: Orthopedics;  Laterality: Left;  .  MULTIPLE TOOTH EXTRACTIONS    . SHOULDER SURGERY  2012   lt  . ULNAR TUNNEL RELEASE  12/15/2011   Procedure: CUBITAL TUNNEL RELEASE;  Surgeon: Matthew Ribas, MD;  Location: [facility];  Service: Orthopedics;  Laterality: Left;  left cubital tunnel release   Past Medical History:  Diagnosis Date  . Allergy   . Chronic pain due to trauma   . Disturbance of skin sensation   . Intercostal neuralgia   . Lumbosacral neuritis   . Myocardial infarction (HCC)   . Rotator cuff (capsule) sprain   . Snores    BP 124/77   Pulse 89   Ht 5\' 9"  (1.753 m)   Wt 203 lb (92.1 kg)   SpO2 95%   BMI 29.98 kg/m   Opioid Risk Score:   Fall Risk Score:  `1  Depression screen PHQ 2/9  Depression screen [facility] 2/9 08/23/2017 07/20/2017 06/25/2017 03/24/2017 01/27/2017 09/09/2016 03/23/2016  Decreased Interest 0 0 0 0 0 0 0  Down, Depressed, Hopeless 0 0 0 0 0 0  0  PHQ - 2 Score 0 0 0 0 0 0 0  Altered sleeping - - - - - - -  Tired, decreased energy - - - - - - -  Feeling bad or failure about yourself  - - - - - - -  Trouble concentrating - - - - - - -  Moving slowly or fidgety/restless - - - - - - -  Suicidal thoughts - - - - - - -  PHQ-9 Score - - - - - - -     Review of Systems  Constitutional: Negative.   HENT: Negative.   Eyes: Negative.   Respiratory: Negative.   Cardiovascular: Negative.   Gastrointestinal: Negative.   Endocrine: Negative.   Genitourinary: Negative.   Musculoskeletal: Positive for arthralgias, back pain, gait problem and myalgias.  Skin: Negative.   Allergic/Immunologic: Negative.   Hematological: Negative.   Psychiatric/Behavioral: Negative.        Objective:   Physical Exam  Constitutional: He is oriented to person, place, and time. He appears well-developed and well-nourished.  HENT:  Head: Normocephalic and atraumatic.  Neck: Normal range of motion. Neck supple.  Cardiovascular: Normal rate and regular rhythm.  Pulmonary/Chest: Effort normal and breath sounds normal.  Musculoskeletal:  Normal Muscle Bulk and Muscle Testing Reveals: Upper Extremities: Right: Full ROM and Muscle Strength 5/5 Left: Decreased ROM 90 Degrees and Muscle Strength 4/5 Thoracic Paraspinal Tenderness: T-7-T-9 Lumbar Paraspinal Tenderness: L-3-L-5 Lower Extremities: Right: Decreased ROM and Muscle Strength 4/5 Right Lower Extremity Flexion  Produces Pain into Lumbar and Right Hip Left: Full ROM and Muscle Strength 5/5 Arises from Chair slowly  Antalgic Gait  Neurological: He is alert and oriented to person, place, and time.  Skin: Skin is warm and dry. No erythema.  Psychiatric: He has a normal mood and affect.  Nursing note and vitals reviewed.         Assessment & Plan:  1. 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  10/25/2017 Continue current medication regimen.  Refilled:Hydrocodone 10/325 mg #120 pills--use every 6 hours as needed.  We will continue the opioid monitoring program, this consists of regular clinic visits, examinations, urine drug screen, pill counts as well as use of [place] Controlled Substance reporting System. 2. Lumbar Radiculitis: Continue current medication regimen with Gabapentin. 10/25/2017 3. Intercostal neuralgia: Unable to  continue Flector Patch due to cost, Continue current medication regimen with Voltaren Gel,Robaxin and Neurontin. 10/25/2017 4. Insomnia: Continue current medication regime with trazodone. 10/25/2017 5. Muscle Spasm: Continue current medication regimen with Robaxin. 10/25/2017 6. Polyarthralgia: Continue current medication regime. Continue to Monitor. 10/25/2017 7. Chronic Right Hip Pain: Continue HEP as Tolerated and Continue current medication regimen. 10/25/2017.  20 minutes of face to face patient care time was spent during this visit. All questions were encouraged and answered.  F/U in 1 month

## 2017-11-19 ENCOUNTER — Encounter: Payer: Medicare HMO | Attending: Physical Medicine & Rehabilitation | Admitting: Registered Nurse

## 2017-11-19 ENCOUNTER — Encounter: Payer: Self-pay | Admitting: Registered Nurse

## 2017-11-19 ENCOUNTER — Other Ambulatory Visit: Payer: Self-pay

## 2017-11-19 VITALS — BP 124/72 | HR 104 | Ht 69.5 in | Wt 208.4 lb

## 2017-11-19 DIAGNOSIS — G4709 Other insomnia: Secondary | ICD-10-CM | POA: Diagnosis not present

## 2017-11-19 DIAGNOSIS — G894 Chronic pain syndrome: Secondary | ICD-10-CM | POA: Diagnosis not present

## 2017-11-19 DIAGNOSIS — M519 Unspecified thoracic, thoracolumbar and lumbosacral intervertebral disc disorder: Secondary | ICD-10-CM

## 2017-11-19 DIAGNOSIS — G8929 Other chronic pain: Secondary | ICD-10-CM | POA: Insufficient documentation

## 2017-11-19 DIAGNOSIS — M25512 Pain in left shoulder: Secondary | ICD-10-CM

## 2017-11-19 DIAGNOSIS — M4647 Discitis, unspecified, lumbosacral region: Secondary | ICD-10-CM | POA: Diagnosis not present

## 2017-11-19 DIAGNOSIS — M62838 Other muscle spasm: Secondary | ICD-10-CM | POA: Diagnosis not present

## 2017-11-19 DIAGNOSIS — M25551 Pain in right hip: Secondary | ICD-10-CM | POA: Insufficient documentation

## 2017-11-19 DIAGNOSIS — Z79891 Long term (current) use of opiate analgesic: Secondary | ICD-10-CM | POA: Diagnosis not present

## 2017-11-19 DIAGNOSIS — G8921 Chronic pain due to trauma: Secondary | ICD-10-CM | POA: Diagnosis not present

## 2017-11-19 DIAGNOSIS — M5416 Radiculopathy, lumbar region: Secondary | ICD-10-CM | POA: Diagnosis not present

## 2017-11-19 DIAGNOSIS — M2550 Pain in unspecified joint: Secondary | ICD-10-CM | POA: Diagnosis not present

## 2017-11-19 DIAGNOSIS — F17210 Nicotine dependence, cigarettes, uncomplicated: Secondary | ICD-10-CM | POA: Diagnosis not present

## 2017-11-19 DIAGNOSIS — Z5181 Encounter for therapeutic drug level monitoring: Secondary | ICD-10-CM | POA: Diagnosis not present

## 2017-11-19 MED ORDER — GABAPENTIN 800 MG PO TABS: 800.0000 mg | ORAL_TABLET | Freq: Three times a day (TID) | ORAL | 5 refills | Status: DC

## 2017-11-19 MED ORDER — METHOCARBAMOL 500 MG PO TABS: 500.0000 mg | ORAL_TABLET | Freq: Three times a day (TID) | ORAL | 5 refills | Status: AC

## 2017-11-19 MED ORDER — HYDROCODONE-ACETAMINOPHEN 10-325 MG PO TABS: 1.0000 | ORAL_TABLET | Freq: Four times a day (QID) | ORAL | 0 refills | Status: DC | PRN

## 2017-11-19 NOTE — Progress Notes (Signed)
Subjective:    Patient ID: Matthew Sloan, male    DOB: 04-28-1961, 57 y.o.   MRN: [id number]  HPI: Matthew Sloan is a 57 year old male who returns for follow up appointment for chronic pain and medication refill. He states his pain is located in his mid-lower back radiating into his right hip and right lower extremity. He rates his pain 9. His current exercise regime walking and performing stretching exercises.   Matthew Sloan Morphine Equivalent is 36.00 MME. Last UDS was Performed on 09/16/2017.  Pain Inventory Average Pain 8 Pain Right Now 9 My pain is constant, sharp, burning, stabbing, tingling and aching  In the last 24 hours, has pain interfered with the following? General activity 10 Relation with others 8 Enjoyment of life 8 What TIME of day is your pain at its worst? all Sleep (in general) Poor  Pain is worse with: walking, bending, sitting, inactivity and standing Pain improves with: medication Relief from Meds: 2  Mobility use a cane ability to climb steps?  no do you drive?  yes  Function disabled: date disabled n/a I need assistance with the following:  meal prep, household duties and shopping  Neuro/Psych weakness numbness tremor tingling trouble walking spasms  Prior Studies Any changes since last visit?  no  Physicians involved in your care Any changes since last visit?  no   Family History  Problem Relation Age of Onset  . Cancer Mother 104       breast cancer  . Heart disease Father 50       CHF   Social History   Socioeconomic History  . Marital status: Single    Spouse name: Not on file  . Number of children: Not on file  . Years of education: Not on file  . Highest education level: Not on file  Occupational History  . Not on file  Social Needs  . Financial resource strain: Not on file  . Food insecurity:    Worry: Not on file    Inability: Not on file  . Transportation needs:    Medical: Not on file    Non-medical: Not  on file  Tobacco Use  . Smoking status: Current Every Day Smoker    Packs/day: 1.00    Years: 30.00    Pack years: 30.00    Types: Cigarettes  . Smokeless tobacco: Never Used  . Tobacco comment: working on it-down to 0.5 ppd  Substance and Sexual Activity  . Alcohol use: No  . Drug use: No  . Sexual activity: Not on file  Lifestyle  . Physical activity:    Days per week: Not on file    Minutes per session: Not on file  . Stress: Not on file  Relationships  . Social connections:    Talks on phone: Not on file    Gets together: Not on file    Attends religious service: Not on file    Active member of club or organization: Not on file    Attends meetings of clubs or organizations: Not on file    Relationship status: Not on file  Other Topics Concern  . Not on file  Social History Narrative  . Not on file   Past Surgical History:  Procedure Laterality Date  . bilateral tubes placed in ears  04/12/2012  . CARPAL TUNNEL RELEASE  12/15/2011   Procedure: CARPAL TUNNEL RELEASE;  Surgeon: [name], MD;  Location: [facility];  Service: Orthopedics;  Laterality: Left;  [name] MULTIPLE TOOTH EXTRACTIONS    . SHOULDER SURGERY  2012   lt  . ULNAR TUNNEL RELEASE  12/15/2011   Procedure: CUBITAL TUNNEL RELEASE;  Surgeon: [name], MD;  Location: [facility];  Service: Orthopedics;  Laterality: Left;  left cubital tunnel release   Past Medical History:  Diagnosis Date  . Allergy   . Chronic pain due to trauma   . Disturbance of skin sensation   . Intercostal neuralgia   . Lumbosacral neuritis   . Myocardial infarction (HCC)   . Rotator cuff (capsule) sprain   . Snores    BP 124/72   Pulse (!) 104   Ht 5' 9.5" (1.765 m)   Wt 208 lb 6.4 oz (94.5 kg)   SpO2 94%   BMI 30.33 kg/m   Opioid Risk Score:   Fall Risk Score:  `1  Depression screen PHQ 2/9  Depression screen [facility] 2/9 11/19/2017 08/23/2017 07/20/2017 06/25/2017 03/24/2017 01/27/2017 09/09/2016    Decreased Interest 0 0 0 0 0 0 0  Down, Depressed, Hopeless 0 0 0 0 0 0 0  PHQ - 2 Score 0 0 0 0 0 0 0  Altered sleeping - - - - - - -  Tired, decreased energy - - - - - - -  Feeling bad or failure about yourself  - - - - - - -  Trouble concentrating - - - - - - -  Moving slowly or fidgety/restless - - - - - - -  Suicidal thoughts - - - - - - -  PHQ-9 Score - - - - - - -    Review of Systems  Constitutional: Negative.   HENT: Negative.   Eyes: Negative.   Respiratory: Negative.   Cardiovascular: Negative.   Gastrointestinal: Negative.   Endocrine: Negative.   Genitourinary: Negative.   Musculoskeletal: Negative.   Skin: Negative.   Allergic/Immunologic: Negative.   Neurological: Negative.   Hematological: Negative.   Psychiatric/Behavioral: Negative.   All other systems reviewed and are negative.      Objective:   Physical Exam  Constitutional: He is oriented to person, place, and time. He appears well-developed and well-nourished.  HENT:  Head: Normocephalic and atraumatic.  Neck: Normal range of motion. Neck supple.  Cardiovascular: Normal rate and regular rhythm.  Pulmonary/Chest: Effort normal and breath sounds normal.  Musculoskeletal:  Normal Muscle Bulk and Muscle Testing Reveals: Upper Extremities: Right:Full ROM and Muscle Strength 5/5 Left: Decreased ROM 45 Degrees and Muscle Strength 4/5 Lumbar Hypersensitivity Right Greater Trochanter Tenderness Lower Extremities Decreased ROM and Muscle Strength 4/5 Arises from Table slowly using cane for support Antalgic gait   Neurological: He is alert and oriented to person, place, and time.  Skin: Skin is warm and dry.  Psychiatric: He has a normal mood and affect.  Nursing note and vitals reviewed.         Assessment & Plan:  1. 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 11/19/2017 Continue current medication regimen.   Refilled:Hydrocodone 10/325 mg #120 pills--use every 6 hours as needed.  We will continue the opioid monitoring program, this consists of regular clinic visits, examinations, urine drug screen, pill counts as well as use of [place] Controlled Substance reporting System. 2. Lumbar Radiculitis: Continue current medication regimen with Gabapentin. 11/19/2017 3. Intercostal neuralgia: Unable to continue Flector Patch due to cost, Continue  current medication regimen withVoltaren Gel,Robaxin and Neurontin. 11/19/2017 4. Insomnia: Continue current medication regime with trazodone. 11/19/2017 5. Muscle Spasm: Continue current medication regimen with Robaxin. 11/19/2017 6. Polyarthralgia: Continue current medication regime. Continue to Monitor. 11/19/2017 7. Chronic Right Hip Pain: Continue HEP as Tolerated and Continue current medication regimen. 11/19/2017.  20 minutes of face to face patient care time was spent during this visit. All questions were encouraged and answered.  F/U in 1 month

## 2017-12-21 ENCOUNTER — Ambulatory Visit: Payer: Medicare HMO | Admitting: Physical Medicine & Rehabilitation

## 2017-12-23 ENCOUNTER — Other Ambulatory Visit: Payer: Self-pay

## 2017-12-23 ENCOUNTER — Ambulatory Visit: Payer: Medicare HMO | Admitting: Physical Medicine & Rehabilitation

## 2017-12-23 ENCOUNTER — Encounter: Payer: Self-pay | Admitting: Physical Medicine & Rehabilitation

## 2017-12-23 ENCOUNTER — Encounter: Payer: Medicare HMO | Attending: Physical Medicine & Rehabilitation

## 2017-12-23 VITALS — BP 117/71 | HR 84 | Ht 69.0 in | Wt 215.8 lb

## 2017-12-23 DIAGNOSIS — G8929 Other chronic pain: Secondary | ICD-10-CM | POA: Insufficient documentation

## 2017-12-23 DIAGNOSIS — F17210 Nicotine dependence, cigarettes, uncomplicated: Secondary | ICD-10-CM | POA: Diagnosis not present

## 2017-12-23 DIAGNOSIS — G8921 Chronic pain due to trauma: Secondary | ICD-10-CM | POA: Insufficient documentation

## 2017-12-23 DIAGNOSIS — M519 Unspecified thoracic, thoracolumbar and lumbosacral intervertebral disc disorder: Secondary | ICD-10-CM | POA: Diagnosis not present

## 2017-12-23 DIAGNOSIS — M5416 Radiculopathy, lumbar region: Secondary | ICD-10-CM | POA: Diagnosis present

## 2017-12-23 DIAGNOSIS — M25551 Pain in right hip: Secondary | ICD-10-CM

## 2017-12-23 DIAGNOSIS — M4647 Discitis, unspecified, lumbosacral region: Secondary | ICD-10-CM | POA: Diagnosis not present

## 2017-12-23 MED ORDER — HYDROCODONE-ACETAMINOPHEN 10-325 MG PO TABS: 1.0000 | ORAL_TABLET | Freq: Four times a day (QID) | ORAL | 0 refills | Status: DC | PRN
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
## 2018-01-21 ENCOUNTER — Encounter: Payer: Self-pay | Admitting: Registered Nurse

## 2018-01-21 ENCOUNTER — Encounter: Payer: Medicare HMO | Attending: Physical Medicine & Rehabilitation | Admitting: Registered Nurse

## 2018-01-21 VITALS — BP 122/79 | HR 76 | Ht 69.0 in | Wt 217.6 lb

## 2018-01-21 DIAGNOSIS — M7582 Other shoulder lesions, left shoulder: Secondary | ICD-10-CM | POA: Diagnosis not present

## 2018-01-21 DIAGNOSIS — M62838 Other muscle spasm: Secondary | ICD-10-CM

## 2018-01-21 DIAGNOSIS — G894 Chronic pain syndrome: Secondary | ICD-10-CM | POA: Diagnosis not present

## 2018-01-21 DIAGNOSIS — G8921 Chronic pain due to trauma: Secondary | ICD-10-CM | POA: Diagnosis not present

## 2018-01-21 DIAGNOSIS — G8929 Other chronic pain: Secondary | ICD-10-CM | POA: Diagnosis present

## 2018-01-21 DIAGNOSIS — M25551 Pain in right hip: Secondary | ICD-10-CM | POA: Insufficient documentation

## 2018-01-21 DIAGNOSIS — M778 Other enthesopathies, not elsewhere classified: Secondary | ICD-10-CM

## 2018-01-21 DIAGNOSIS — Z79891 Long term (current) use of opiate analgesic: Secondary | ICD-10-CM

## 2018-01-21 DIAGNOSIS — Z5181 Encounter for therapeutic drug level monitoring: Secondary | ICD-10-CM

## 2018-01-21 DIAGNOSIS — M5416 Radiculopathy, lumbar region: Secondary | ICD-10-CM | POA: Diagnosis present

## 2018-01-21 DIAGNOSIS — F17210 Nicotine dependence, cigarettes, uncomplicated: Secondary | ICD-10-CM | POA: Insufficient documentation

## 2018-01-21 DIAGNOSIS — M519 Unspecified thoracic, thoracolumbar and lumbosacral intervertebral disc disorder: Secondary | ICD-10-CM

## 2018-01-21 DIAGNOSIS — M4647 Discitis, unspecified, lumbosacral region: Secondary | ICD-10-CM | POA: Insufficient documentation

## 2018-01-21 MED ORDER — HYDROCODONE-ACETAMINOPHEN 10-325 MG PO TABS: 1.0000 | ORAL_TABLET | Freq: Four times a day (QID) | ORAL | 0 refills | Status: DC | PRN

## 2018-01-21 NOTE — Progress Notes (Signed)
Subjective:    Patient ID: Matthew Sloan, male    DOB: Nov 19, 1960, 57 y.o.   MRN: [id number]  HPI: [name] is a 57 year old male who returns for follow up appointment for chronic pain and medication refill. He states his pain is located in his mid- lower back radiating into his right hip and right lower extremity. Also reports joint pain and generalized pain all over. He rates his pain 9. His current exercise regime is walking and performing stretching exercises.   Matthew Sloan is 40.00 MME. His last UDS was Performed on 09/16/2017, it was consistent. UDS ordered today.    Pain Inventory Average Pain 8 Pain Right Now 9 My pain is sharp, burning, stabbing, tingling and aching  In the last 24 hours, has pain interfered with the following? General activity 10 Relation with others 8 Enjoyment of life 8 What TIME of day is your pain at its worst? all Sleep (in general) Poor  Pain is worse with: walking, bending, sitting and standing Pain improves with: medication Relief from Meds: 2  Mobility use a cane ability to climb steps?  no do you drive?  yes  Function disabled: date disabled n/a  Neuro/Psych weakness numbness tremor tingling trouble walking spasms  Prior Studies Any changes since last visit?  no  Physicians involved in your care Any changes since last visit?  no   Family History  Problem Relation Age of Onset  . Cancer Mother 8945       breast cancer  . Heart disease Father 5972       CHF   Social History   Socioeconomic History  . Marital status: Single    Spouse name: Not on file  . Number of children: Not on file  . Years of education: Not on file  . Highest education level: Not on file  Occupational History  . Not on file  Social Needs  . Financial resource strain: Not on file  . Food insecurity:    Worry: Not on file    Inability: Not on file  . Transportation needs:    Medical: Not on file    Non-medical: Not  on file  Tobacco Use  . Smoking status: Current Every Day Smoker    Packs/day: 1.00    Years: 30.00    Pack years: 30.00    Types: Cigarettes  . Smokeless tobacco: Never Used  . Tobacco comment: working on it-down to 0.5 ppd  Substance and Sexual Activity  . Alcohol use: No  . Drug use: No  . Sexual activity: Not on file  Lifestyle  . Physical activity:    Days per week: Not on file    Minutes per session: Not on file  . Stress: Not on file  Relationships  . Social connections:    Talks on phone: Not on file    Gets together: Not on file    Attends religious service: Not on file    Active member of club or organization: Not on file    Attends meetings of clubs or organizations: Not on file    Relationship status: Not on file  Other Topics Concern  . Not on file  Social History Narrative  . Not on file   Past Surgical History:  Procedure Laterality Date  . bilateral tubes placed in ears  04/12/2012  . CARPAL TUNNEL RELEASE  12/15/2011   Procedure: CARPAL TUNNEL RELEASE;  Surgeon: Tami RibasKevin R Kuzma, MD;  Location: [facility];  Service: Orthopedics;  Laterality: Left;  [name] MULTIPLE TOOTH EXTRACTIONS    . SHOULDER SURGERY  2012   lt  . ULNAR TUNNEL RELEASE  12/15/2011   Procedure: CUBITAL TUNNEL RELEASE;  Surgeon: [name], MD;  [facility];  Service: Orthopedics;  Laterality: Left;  left cubital tunnel release   Past Medical History:  Diagnosis Date  . Allergy   . Chronic pain due to trauma   . Disturbance of skin sensation   . Intercostal neuralgia   . Lumbosacral neuritis   . Myocardial infarction (HCC)   . Rotator cuff (capsule) sprain   . Snores    BP 122/79   Pulse 76   Ht 5\' 9"  (1.753 m)   Wt 217 lb 9.6 oz (98.7 kg)   SpO2 95%   BMI 32.13 kg/m   Opioid Risk Score:   Fall Risk Score:  `1  Depression screen PHQ 2/9  Depression screen [facility] 2/9 12/23/2017 11/19/2017 08/23/2017 07/20/2017 06/25/2017 03/24/2017 01/27/2017    Decreased Interest 0 0 0 0 0 0 0  Down, Depressed, Hopeless 0 0 0 0 0 0 0  PHQ - 2 Score 0 0 0 0 0 0 0  Altered sleeping - - - - - - -  Tired, decreased energy - - - - - - -  Feeling bad or failure about yourself  - - - - - - -  Trouble concentrating - - - - - - -  Moving slowly or fidgety/restless - - - - - - -  Suicidal thoughts - - - - - - -  PHQ-9 Score - - - - - - -   Review of Systems  Constitutional: Negative.   HENT: Negative.   Eyes: Negative.   Respiratory: Negative.   Cardiovascular: Negative.   Gastrointestinal: Negative.   Endocrine: Negative.   Genitourinary: Negative.   Musculoskeletal: Negative.   Skin: Negative.   Allergic/Immunologic: Negative.   Neurological: Negative.   Hematological: Negative.   Psychiatric/Behavioral: Negative.   All other systems reviewed and are negative.      Objective:   Physical Exam  Constitutional: He is oriented to person, place, and time. He appears well-developed and well-nourished.  HENT:  Head: Normocephalic and atraumatic.  Neck: Normal range of motion. Neck supple.  Cardiovascular: Normal rate and regular rhythm.  Pulmonary/Chest: Effort normal and breath sounds normal.  Musculoskeletal:  Normal Muscle Bulk and Muscle Testing Reveals: Upper Extremities: Right: Full ROM and Muscle Strength 5/5 Left: Decreased ROM 45 Degrees and Muscle Strength 4/5 Left AC Joint Tenderness Lumbar Paraspinal Tenderness: L-3-L-5 Lower Extremities: Right: Decreased ROM  and Muscle Strength 5/5 Left: Full ROM and Muscle Strength 5/5 Arises from chair slowly using cane for support Antalgic Gait   Neurological: He is alert and oriented to person, place, and time.  Skin: Skin is warm and dry.  Psychiatric: He has a normal mood and affect. His behavior is normal.  Nursing note and vitals reviewed.         Assessment and Plan:  1. 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 01/21/2018 Continue current medication regimen. Refilled:Hydrocodone 10/325 mg #120 pills--use every 6 hours as needed.  We will continue the opioid monitoring program, this consists of regular clinic visits, examinations, urine drug screen, pill counts as well as use of [place] Controlled Substance reporting System. 2. Lumbar Radiculitis:  Continuecurrent medication regimen withGabapentin. 01/21/2018 3. Intercostal neuralgia: Unable to continue Flector Patch due to cost, Continuecurrent medication regimen withVoltaren Gel,Robaxin and Neurontin. 01/21/2018 4. Insomnia: Continuecurrent medication regime withtrazodone. 01/21/2018 5. Muscle Spasm: Continuecurrent medication regimen withRobaxin. 01/21/2018 6. Polyarthralgia: Continue current medication regime. Continue to Monitor. 01/21/2018 7. Chronic Right Hip Pain: Continue HEP as Tolerated and Continue current medication regimen. 01/21/2018. 8. Left Shoulder Tendonitis: Continue with Ice and Heat Therapy. Continue to Monitor. 01/21/2018.  20 minutes of face to face patient care time was spent during this visit. All questions were encouraged and answered.  F/U in 1 month

## 2018-01-27 ENCOUNTER — Telehealth: Payer: Self-pay | Admitting: *Deleted

## 2018-01-27 LAB — TOXASSURE SELECT,+ANTIDEPR,UR

## 2018-01-27 NOTE — Telephone Encounter (Signed)
Urine drug screen for this encounter is consistent for prescribed medication 

## 2018-02-18 ENCOUNTER — Telehealth: Payer: Self-pay | Admitting: Registered Nurse

## 2018-02-18 MED ORDER — HYDROCODONE-ACETAMINOPHEN 10-325 MG PO TABS: 1.0000 | ORAL_TABLET | Freq: Four times a day (QID) | ORAL | 0 refills | Status: DC | PRN

## 2018-02-18 NOTE — Telephone Encounter (Signed)
Medication e-scribe to accommodate scheduled appointment.  According to the PMP Aware Web-site last prescription filled on 01/21/2018.  Scheduled appointment 02/28/2018. Placed a call to [name] regarding the above, no answer. Left message to return the call.

## 2018-02-28 ENCOUNTER — Telehealth: Payer: Self-pay | Admitting: Registered Nurse

## 2018-02-28 ENCOUNTER — Encounter: Payer: Medicare HMO | Admitting: Registered Nurse

## 2018-02-28 NOTE — Telephone Encounter (Signed)
Received a call from [name], he  states he's having  financial hardship, he hasn't received his disability check for this month. He doesn't have his co=pay, we will change his appointment till 03/21/2018, he verbalizes understanding.

## 2018-03-21 ENCOUNTER — Encounter: Payer: Medicare HMO | Admitting: Registered Nurse

## 2018-03-22 ENCOUNTER — Encounter: Payer: Medicare HMO | Attending: Physical Medicine & Rehabilitation | Admitting: Registered Nurse

## 2018-03-22 ENCOUNTER — Encounter: Payer: Self-pay | Admitting: Registered Nurse

## 2018-03-22 VITALS — BP 136/88 | HR 85 | Ht 69.0 in | Wt 227.6 lb

## 2018-03-22 DIAGNOSIS — M519 Unspecified thoracic, thoracolumbar and lumbosacral intervertebral disc disorder: Secondary | ICD-10-CM

## 2018-03-22 DIAGNOSIS — G8921 Chronic pain due to trauma: Secondary | ICD-10-CM | POA: Insufficient documentation

## 2018-03-22 DIAGNOSIS — M62838 Other muscle spasm: Secondary | ICD-10-CM

## 2018-03-22 DIAGNOSIS — Z5181 Encounter for therapeutic drug level monitoring: Secondary | ICD-10-CM

## 2018-03-22 DIAGNOSIS — M5416 Radiculopathy, lumbar region: Secondary | ICD-10-CM | POA: Diagnosis present

## 2018-03-22 DIAGNOSIS — G894 Chronic pain syndrome: Secondary | ICD-10-CM

## 2018-03-22 DIAGNOSIS — M25551 Pain in right hip: Secondary | ICD-10-CM | POA: Diagnosis not present

## 2018-03-22 DIAGNOSIS — F17210 Nicotine dependence, cigarettes, uncomplicated: Secondary | ICD-10-CM | POA: Insufficient documentation

## 2018-03-22 DIAGNOSIS — M4647 Discitis, unspecified, lumbosacral region: Secondary | ICD-10-CM | POA: Insufficient documentation

## 2018-03-22 DIAGNOSIS — G8929 Other chronic pain: Secondary | ICD-10-CM | POA: Diagnosis not present

## 2018-03-22 DIAGNOSIS — Z79891 Long term (current) use of opiate analgesic: Secondary | ICD-10-CM

## 2018-03-22 MED ORDER — HYDROCODONE-ACETAMINOPHEN 10-325 MG PO TABS: 1.0000 | ORAL_TABLET | Freq: Four times a day (QID) | ORAL | 0 refills | Status: DC | PRN

## 2018-03-22 NOTE — Progress Notes (Signed)
Subjective:    Patient ID: Matthew Sloan, male    DOB: [birth year], 57 y.o.   MRN: [id number]  HPI: Matthew Sloan is a 57 year old male who returns for follow up appointment for chronic pain and medication refill. He states his pain is located in his mid- lower back radiating into his right hip and bilateral lower extremities R>L. He rates his pain 9. His current exercise regime is walking and performing stretching exercises.   Mr. Kopf Morphine Equivalent is 40.00 MME. Last UDS was Performed on 01/21/2018, it was consistent.   Pain Inventory Average Pain 8 Pain Right Now 9 My pain is sharp, burning, stabbing, tingling and aching  In the last 24 hours, has pain interfered with the following? General activity 10 Relation with others 8 Enjoyment of life 8 What TIME of day is your pain at its worst? all Sleep (in general) Poor  Pain is worse with: walking, bending, sitting, inactivity and standing Pain improves with: medication Relief from Meds: 2  Mobility use a cane  Function disabled: date disabled .  Neuro/Psych weakness numbness tremor tingling trouble walking spasms  Prior Studies Any changes since last visit?  no  Physicians involved in your care Any changes since last visit?  no   Family History  Problem Relation Age of Onset  . Cancer Mother 10       breast cancer  . Heart disease Father 46       CHF   Social History   Socioeconomic History  . Marital status: Single    Spouse name: Not on file  . Number of children: Not on file  . Years of education: Not on file  . Highest education level: Not on file  Occupational History  . Not on file  Social Needs  . Financial resource strain: Not on file  . Food insecurity:    Worry: Not on file    Inability: Not on file  . Transportation needs:    Medical: Not on file    Non-medical: Not on file  Tobacco Use  . Smoking status: Current Every Day Smoker    Packs/day: 1.00    Years: 30.00   Pack years: 30.00    Types: Cigarettes  . Smokeless tobacco: Never Used  . Tobacco comment: working on it-down to 0.5 ppd  Substance and Sexual Activity  . Alcohol use: No  . Drug use: No  . Sexual activity: Not on file  Lifestyle  . Physical activity:    Days per week: Not on file    Minutes per session: Not on file  . Stress: Not on file  Relationships  . Social connections:    Talks on phone: Not on file    Gets together: Not on file    Attends religious service: Not on file    Active member of club or organization: Not on file    Attends meetings of clubs or organizations: Not on file    Relationship status: Not on file  Other Topics Concern  . Not on file  Social History Narrative  . Not on file   Past Surgical History:  Procedure Laterality Date  . bilateral tubes placed in ears  04/12/2012  . CARPAL TUNNEL RELEASE  12/15/2011   Procedure: CARPAL TUNNEL RELEASE;  Surgeon: Matthew Ribas, MD;  Location: [facility];  Service: Orthopedics;  Laterality: Left;  [name] MULTIPLE TOOTH EXTRACTIONS    . SHOULDER SURGERY  2012  lt  . ULNAR TUNNEL RELEASE  12/15/2011   Procedure: CUBITAL TUNNEL RELEASE;  Surgeon: Matthew Ribas, MD;  Location: [facility];  Service: Orthopedics;  Laterality: Left;  left cubital tunnel release   Past Medical History:  Diagnosis Date  . Allergy   . Chronic pain due to trauma   . Disturbance of skin sensation   . Intercostal neuralgia   . Lumbosacral neuritis   . Myocardial infarction (HCC)   . Rotator cuff (capsule) sprain   . Snores    There were no vitals taken for this visit.  Opioid Risk Score:   Fall Risk Score:  `1  Depression screen PHQ 2/9  Depression screen [facility] 2/9 12/23/2017 11/19/2017 08/23/2017 07/20/2017 06/25/2017 03/24/2017 01/27/2017  Decreased Interest 0 0 0 0 0 0 0  Down, Depressed, Hopeless 0 0 0 0 0 0 0  PHQ - 2 Score 0 0 0 0 0 0 0  Altered sleeping - - - - - - -  Tired, decreased energy - - - - - -  -  Feeling bad or failure about yourself  - - - - - - -  Trouble concentrating - - - - - - -  Moving slowly or fidgety/restless - - - - - - -  Suicidal thoughts - - - - - - -  PHQ-9 Score - - - - - - -     Review of Systems  Constitutional: Negative.   HENT: Negative.   Eyes: Negative.   Respiratory: Negative.   Cardiovascular: Negative.   Gastrointestinal: Negative.   Endocrine: Negative.   Genitourinary: Negative.   Musculoskeletal: Positive for arthralgias, back pain, gait problem and myalgias.  Skin: Negative.   Allergic/Immunologic: Negative.   Neurological: Positive for tremors, weakness and numbness.  Hematological: Negative.   Psychiatric/Behavioral: Negative.   All other systems reviewed and are negative.      Objective:   Physical Exam  Constitutional: He is oriented to person, place, and time. He appears well-developed and well-nourished.  HENT:  Head: Normocephalic and atraumatic.  Neck: Normal range of motion. Neck supple.  Cardiovascular: Normal rate and regular rhythm.  Pulmonary/Chest: Effort normal and breath sounds normal.  Musculoskeletal:  Normal Muscle Bulk and Muscle Testing Reveals:  Upper Extremities: Full ROM and Muscle Strength 5/5 Thoracic Paraspinal Tenderness: T-3-T-7 Lumbar Paraspinal Tenderness: L-3-L-5 Right Greater Trochanter Tenderness Lower Extremities: Right Decreased ROM and Muscle Strength 4/5 Right lower extremity Flexion Produces pain into extremity Left: Full ROM and Muscle Strength 4/5 Arises from chair slowly, using cane for support Antalgic Gait  Neurological: He is alert and oriented to person, place, and time.  Skin: Skin is warm and dry.  Psychiatric: He has a normal mood and affect. His behavior is normal.  Nursing note and vitals reviewed.         Assessment & Plan:  1. 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  03/22/2018 Continue current medication regimen. Refilled:Hydrocodone 10/325 mg #120 pills--use every 6 hours as needed.  We will continue the opioid monitoring program, this consists of regular clinic visits, examinations, urine drug screen, pill counts as well as use of [place] Controlled Substance reporting System. 2. Lumbar Radiculitis: Continuecurrent medication regimen withGabapentin. 03/22/2018 3. Intercostal neuralgia: Unable to continue Flector Patch due to cost, Continuecurrent medication regimen withVoltaren Gel,Robaxin and Neurontin. 03/22/2018 4. Insomnia: Continuecurrent medication regime withtrazodone. 03/22/2018 5. Muscle Spasm: Continuecurrent medication regimen  withRobaxin. 03/22/2018 6. Polyarthralgia: Continue current medication regime. Continue to Monitor. 03/22/2018 7. Chronic Right Hip Pain: Continue HEP as Tolerated and Continue current medication regimen. 03/22/2018. 8. Left Shoulder Tendonitis:  No complaints today. Continue with Ice and Heat Therapy. Continue to Monitor. 03/22/2018.  20 minutes of face to face patient care time was spent during this visit. All questions were encouraged and answered.  F/U in 1 month

## 2018-04-15 ENCOUNTER — Encounter: Payer: Managed Care, Other (non HMO) | Admitting: Registered Nurse

## 2018-04-18 ENCOUNTER — Encounter: Payer: Managed Care, Other (non HMO) | Attending: Physical Medicine & Rehabilitation | Admitting: Registered Nurse

## 2018-04-18 ENCOUNTER — Encounter: Payer: Self-pay | Admitting: Registered Nurse

## 2018-04-18 ENCOUNTER — Other Ambulatory Visit: Payer: Self-pay

## 2018-04-18 VITALS — BP 129/85 | HR 81 | Ht 69.0 in | Wt 230.0 lb

## 2018-04-18 DIAGNOSIS — M62838 Other muscle spasm: Secondary | ICD-10-CM

## 2018-04-18 DIAGNOSIS — G8929 Other chronic pain: Secondary | ICD-10-CM | POA: Diagnosis not present

## 2018-04-18 DIAGNOSIS — M25551 Pain in right hip: Secondary | ICD-10-CM | POA: Diagnosis not present

## 2018-04-18 DIAGNOSIS — M5416 Radiculopathy, lumbar region: Secondary | ICD-10-CM

## 2018-04-18 DIAGNOSIS — M4647 Discitis, unspecified, lumbosacral region: Secondary | ICD-10-CM | POA: Diagnosis not present

## 2018-04-18 DIAGNOSIS — Z5181 Encounter for therapeutic drug level monitoring: Secondary | ICD-10-CM

## 2018-04-18 DIAGNOSIS — F17210 Nicotine dependence, cigarettes, uncomplicated: Secondary | ICD-10-CM | POA: Diagnosis not present

## 2018-04-18 DIAGNOSIS — M519 Unspecified thoracic, thoracolumbar and lumbosacral intervertebral disc disorder: Secondary | ICD-10-CM | POA: Diagnosis not present

## 2018-04-18 DIAGNOSIS — G4709 Other insomnia: Secondary | ICD-10-CM

## 2018-04-18 DIAGNOSIS — G8921 Chronic pain due to trauma: Secondary | ICD-10-CM | POA: Diagnosis not present

## 2018-04-18 DIAGNOSIS — M2550 Pain in unspecified joint: Secondary | ICD-10-CM

## 2018-04-18 DIAGNOSIS — G894 Chronic pain syndrome: Secondary | ICD-10-CM

## 2018-04-18 DIAGNOSIS — Z79891 Long term (current) use of opiate analgesic: Secondary | ICD-10-CM

## 2018-04-18 MED ORDER — HYDROCODONE-ACETAMINOPHEN 10-325 MG PO TABS: 1.0000 | ORAL_TABLET | Freq: Four times a day (QID) | ORAL | 0 refills | Status: DC | PRN

## 2018-04-18 NOTE — Progress Notes (Signed)
Subjective:    Patient ID: Matthew Sloan, male    DOB: 1961-05-15, 57 y.o.   MRN: [id number]  HPI: Matthew Sloan is a 57 year old male who returns for follow up appointment for chronic pain and medication refill. He states his pain is located in his mid- lower back and right hip pain. Also reports generalized  joint pain all over. He rates his pain 8. His current exercise regime is walking and performing stretching exercises.   Matthew Sloan Morphine Equivalent is 40.00 MME. Last UDS was Performed on 01/21/2018, it was consistent.   Pain Inventory Average Pain 8 Pain Right Now 8 My pain is sharp, dull, stabbing, tingling and aching  In the last 24 hours, has pain interfered with the following? General activity 10 Relation with others 8 Enjoyment of life 8 What TIME of day is your pain at its worst? all Sleep (in general) Poor  Pain is worse with: walking, bending, sitting, inactivity and standing Pain improves with: medication Relief from Meds: 2  Mobility walk with assistance use a cane how many minutes can you walk? few ability to climb steps?  no do you drive?  yes  Function Do you have any goals in this area?  yes  Neuro/Psych weakness numbness tingling trouble walking spasms  Prior Studies Any changes since last visit?  no  Physicians involved in your care Any changes since last visit?  no   Family History  Problem Relation Age of Onset  . Cancer Mother 21       breast cancer  . Heart disease Father 22       CHF   Social History   Socioeconomic History  . Marital status: Single    Spouse name: Not on file  . Number of children: Not on file  . Years of education: Not on file  . Highest education level: Not on file  Occupational History  . Not on file  Social Needs  . Financial resource strain: Not on file  . Food insecurity:    Worry: Not on file    Inability: Not on file  . Transportation needs:    Medical: Not on file    Non-medical:  Not on file  Tobacco Use  . Smoking status: Current Every Day Smoker    Packs/day: 1.00    Years: 30.00    Pack years: 30.00    Types: Cigarettes  . Smokeless tobacco: Never Used  . Tobacco comment: working on it-down to 0.5 ppd  Substance and Sexual Activity  . Alcohol use: No  . Drug use: No  . Sexual activity: Not on file  Lifestyle  . Physical activity:    Days per week: Not on file    Minutes per session: Not on file  . Stress: Not on file  Relationships  . Social connections:    Talks on phone: Not on file    Gets together: Not on file    Attends religious service: Not on file    Active member of club or organization: Not on file    Attends meetings of clubs or organizations: Not on file    Relationship status: Not on file  Other Topics Concern  . Not on file  Social History Narrative  . Not on file   Past Surgical History:  Procedure Laterality Date  . bilateral tubes placed in ears  04/12/2012  . CARPAL TUNNEL RELEASE  12/15/2011   Procedure: CARPAL TUNNEL RELEASE;  Surgeon:  [name], MD;  Location: [facility];  Service: Orthopedics;  Laterality: Left;  [name] MULTIPLE TOOTH EXTRACTIONS    . SHOULDER SURGERY  2012   lt  . ULNAR TUNNEL RELEASE  12/15/2011   Procedure: CUBITAL TUNNEL RELEASE;  Surgeon: [name], MD;  Location:  [facility];  Service: Orthopedics;  Laterality: Left;  left cubital tunnel release   Past Medical History:  Diagnosis Date  . Allergy   . Chronic pain due to trauma   . Disturbance of skin sensation   . Intercostal neuralgia   . Lumbosacral neuritis   . Myocardial infarction (HCC)   . Rotator cuff (capsule) sprain   . Snores    BP 129/85   Pulse 81   Ht 5\' 9"  (1.753 m)   Wt 230 lb (104.3 kg)   SpO2 95%   BMI 33.97 kg/m   Opioid Risk Score:   Fall Risk Score:  `1  Depression screen PHQ 2/9  Depression screen [facility] 2/9 04/18/2018 12/23/2017 11/19/2017 08/23/2017 07/20/2017 06/25/2017 03/24/2017    Decreased Interest 0 0 0 0 0 0 0  Down, Depressed, Hopeless 0 0 0 0 0 0 0  PHQ - 2 Score 0 0 0 0 0 0 0  Altered sleeping - - - - - - -  Tired, decreased energy - - - - - - -  Feeling bad or failure about yourself  - - - - - - -  Trouble concentrating - - - - - - -  Moving slowly or fidgety/restless - - - - - - -  Suicidal thoughts - - - - - - -  PHQ-9 Score - - - - - - -    Review of Systems  Constitutional: Negative.   HENT: Negative.   Eyes: Negative.   Respiratory: Negative.   Cardiovascular: Negative.   Gastrointestinal: Negative.   Endocrine: Negative.   Genitourinary: Negative.   Musculoskeletal: Negative.   Skin: Negative.   Allergic/Immunologic: Negative.   Neurological: Negative.   Hematological: Negative.   Psychiatric/Behavioral: Negative.   All other systems reviewed and are negative.      Objective:   Physical Exam  Constitutional: He is oriented to person, place, and time. He appears well-developed and well-nourished.  HENT:  Head: Normocephalic and atraumatic.  Neck: Normal range of motion. Neck supple.  Cardiovascular: Normal rate and regular rhythm.  Pulmonary/Chest: Effort normal and breath sounds normal.  Musculoskeletal:  Normal Muscle Bulk and Muscle Testing Reveals: Upper Extremities:Right Full ROM and Muscle Strength 5/5 Left: Decreased ROM 45 Degrees and Muscle Strength 5/5 Thoracic Paraspinal Tenderness: T-10-T-12 Lumbar Paraspinal Tenderness: L-3-L-5 Right Greater Trochanter tenderness Lower Extremities: Right: Decreased ROM and Muscle Strength 4/5 Right Lower Extremity Flexion Produces Pain into Lumbar and Right Hip Left: Full ROM and Muscle Strength 5/5 Arises from chair slowly using cane for support  Antalgic gait  Neurological: He is alert and oriented to person, place, and time.  Skin: Skin is warm and dry.  Psychiatric: He has a normal mood and affect. His behavior is normal.  Nursing note and vitals reviewed.          Assessment & Plan:  1. 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 Refilled:Hydrocodone 10/325 mg #120 pills--use every 6 hours as needed. 04/18/2018. We will continue the opioid monitoring program, this consists of regular clinic visits, examinations, urine drug screen, pill  counts as well as use of [place] Controlled Substance reporting System. 2. Lumbar Radiculitis: Continuecurrent medication regimen withGabapentin. 04/18/2018 3. Intercostal neuralgia: Unable to continue Flector Patch due to cost, Continuecurrent medication regimen withVoltaren Gel,Robaxin and Neurontin. 04/18/2018 4. Insomnia: Continuecurrent medication regime withtrazodone. 04/18/2018 5. Muscle Spasm: Continuecurrent medication regimen withRobaxin. 04/18/2018 6. Polyarthralgia: Continue current medication regime. Continue to Monitor. 04/18/2018 7. Chronic Right Hip Pain: Continue HEP as Tolerated and Continue current medication regimen. 04/18/2018.  20 minutes of face to face patient care time was spent during this visit. All questions were encouraged and answered.  F/U in 1 month

## 2018-04-18 NOTE — Patient Instructions (Signed)
Inquire about Melatonin with Pharmacist

## 2018-05-19 ENCOUNTER — Ambulatory Visit (HOSPITAL_COMMUNITY)
Admission: RE | Admit: 2018-05-19 | Discharge: 2018-05-19 | Disposition: A | Discharge status: Home / Self Care | Payer: Medicare HMO | Source: Ambulatory Visit | Attending: Registered Nurse | Admitting: Registered Nurse

## 2018-05-19 ENCOUNTER — Encounter: Payer: Managed Care, Other (non HMO) | Attending: Physical Medicine & Rehabilitation | Admitting: Registered Nurse

## 2018-05-19 ENCOUNTER — Encounter: Payer: Self-pay | Admitting: Registered Nurse

## 2018-05-19 VITALS — BP 145/77 | HR 84 | Resp 14 | Ht 69.0 in | Wt 230.0 lb

## 2018-05-19 DIAGNOSIS — M25551 Pain in right hip: Secondary | ICD-10-CM | POA: Diagnosis present

## 2018-05-19 DIAGNOSIS — M5416 Radiculopathy, lumbar region: Secondary | ICD-10-CM | POA: Diagnosis present

## 2018-05-19 DIAGNOSIS — G8921 Chronic pain due to trauma: Secondary | ICD-10-CM | POA: Diagnosis not present

## 2018-05-19 DIAGNOSIS — F17210 Nicotine dependence, cigarettes, uncomplicated: Secondary | ICD-10-CM | POA: Diagnosis not present

## 2018-05-19 DIAGNOSIS — M62838 Other muscle spasm: Secondary | ICD-10-CM

## 2018-05-19 DIAGNOSIS — R0781 Pleurodynia: Secondary | ICD-10-CM

## 2018-05-19 DIAGNOSIS — M4647 Discitis, unspecified, lumbosacral region: Secondary | ICD-10-CM | POA: Diagnosis not present

## 2018-05-19 DIAGNOSIS — M519 Unspecified thoracic, thoracolumbar and lumbosacral intervertebral disc disorder: Secondary | ICD-10-CM | POA: Diagnosis not present

## 2018-05-19 DIAGNOSIS — M879 Osteonecrosis, unspecified: Secondary | ICD-10-CM | POA: Diagnosis not present

## 2018-05-19 DIAGNOSIS — M546 Pain in thoracic spine: Secondary | ICD-10-CM

## 2018-05-19 DIAGNOSIS — G8929 Other chronic pain: Secondary | ICD-10-CM | POA: Insufficient documentation

## 2018-05-19 MED ORDER — HYDROCODONE-ACETAMINOPHEN 10-325 MG PO TABS: 1.0000 | ORAL_TABLET | Freq: Four times a day (QID) | ORAL | 0 refills | Status: DC | PRN

## 2018-05-19 NOTE — Progress Notes (Signed)
Subjective:    Patient ID: Matthew Sloan, male    DOB: Oct 16, 1960, 57 y.o.   MRN: [id number]  HPI: Matthew Sloan is a 56 year old male who returns for follow up appointment for chronic pain and medication refill. He states his pain is located in his left fib also reports increase intensity of pain, mid- lower back and reports increase intensity of right hip pain. Denies falling. We will order X-rays. He verbalizes understanding. He rates his pain 9. His current exercise regime is walking short distance and performing stretching exercises.   Matthew Sloan Morphine Equivalent is 40.00 MME. Last UDS was Performed on 01/21/2018, it was consistent.    Pain Inventory Average Pain 8 Pain Right Now 9 My pain is sharp, burning, stabbing, tingling and aching  In the last 24 hours, has pain interfered with the following? General activity 10 Relation with others 8 Enjoyment of life 8 What TIME of day is your pain at its worst? all Sleep (in general) Poor  Pain is worse with: walking, bending, sitting, inactivity and standing Pain improves with: medication Relief from Meds: 1  Mobility walk with assistance use a cane  Function disabled: date disabled .  Neuro/Psych weakness numbness tremor tingling trouble walking spasms  Prior Studies Any changes since last visit?  no  Physicians involved in your care Any changes since last visit?  no   Family History  Problem Relation Age of Onset  . Cancer Mother 23       breast cancer  . Heart disease Father 83       CHF   Social History   Socioeconomic History  . Marital status: Single    Spouse name: Not on file  . Number of children: Not on file  . Years of education: Not on file  . Highest education level: Not on file  Occupational History  . Not on file  Social Needs  . Financial resource strain: Not on file  . Food insecurity:    Worry: Not on file    Inability: Not on file  . Transportation needs:    Medical:  Not on file    Non-medical: Not on file  Tobacco Use  . Smoking status: Current Every Day Smoker    Packs/day: 1.00    Years: 30.00    Pack years: 30.00    Types: Cigarettes  . Smokeless tobacco: Never Used  . Tobacco comment: working on it-down to 0.5 ppd  Substance and Sexual Activity  . Alcohol use: No  . Drug use: No  . Sexual activity: Not on file  Lifestyle  . Physical activity:    Days per week: Not on file    Minutes per session: Not on file  . Stress: Not on file  Relationships  . Social connections:    Talks on phone: Not on file    Gets together: Not on file    Attends religious service: Not on file    Active member of club or organization: Not on file    Attends meetings of clubs or organizations: Not on file    Relationship status: Not on file  Other Topics Concern  . Not on file  Social History Narrative  . Not on file   Past Surgical History:  Procedure Laterality Date  . bilateral tubes placed in ears  04/12/2012  . CARPAL TUNNEL RELEASE  12/15/2011   Procedure: CARPAL TUNNEL RELEASE;  Surgeon: [name], MD;  Location: [facility];  Service: Orthopedics;  Laterality: Left;  [name] MULTIPLE TOOTH EXTRACTIONS    . SHOULDER SURGERY  2012   lt  . ULNAR TUNNEL RELEASE  12/15/2011   Procedure: CUBITAL TUNNEL RELEASE;  Surgeon: [name], MD;  Location: [facility];  Service: Orthopedics;  Laterality: Left;  left cubital tunnel release   Past Medical History:  Diagnosis Date  . Allergy   . Chronic pain due to trauma   . Disturbance of skin sensation   . Intercostal neuralgia   . Lumbosacral neuritis   . Myocardial infarction (HCC)   . Rotator cuff (capsule) sprain   . Snores    BP (!) 145/77   Pulse 84   Resp 14   Ht 5\' 9"  (1.753 m)   Wt 230 lb (104.3 kg)   SpO2 94%   BMI 33.97 kg/m   Opioid Risk Score:   Fall Risk Score:  `1  Depression screen PHQ 2/9  Depression screen [facility] 2/9 04/18/2018 12/23/2017 11/19/2017  08/23/2017 07/20/2017 06/25/2017 03/24/2017  Decreased Interest 0 0 0 0 0 0 0  Down, Depressed, Hopeless 0 0 0 0 0 0 0  PHQ - 2 Score 0 0 0 0 0 0 0  Altered sleeping - - - - - - -  Tired, decreased energy - - - - - - -  Feeling bad or failure about yourself  - - - - - - -  Trouble concentrating - - - - - - -  Moving slowly or fidgety/restless - - - - - - -  Suicidal thoughts - - - - - - -  PHQ-9 Score - - - - - - -   Review of Systems  Constitutional: Negative.   HENT: Negative.   Eyes: Negative.   Respiratory: Negative.   Cardiovascular: Negative.   Gastrointestinal: Negative.   Endocrine: Negative.   Genitourinary: Negative.   Musculoskeletal: Positive for arthralgias, back pain and gait problem.       Spasms   Skin: Negative.   Allergic/Immunologic: Negative.   Neurological: Positive for tremors, weakness and numbness.       Tingling   Hematological: Negative.   Psychiatric/Behavioral: Negative.        Objective:   Physical Exam  Constitutional: He is oriented to person, place, and time. He appears well-developed and well-nourished.  HENT:  Head: Normocephalic and atraumatic.  Neck: Normal range of motion. Neck supple.  Cardiovascular: Normal rate and regular rhythm.  Pulmonary/Chest: Effort normal and breath sounds normal.  Musculoskeletal:  Normal Muscle Bulk and Muscle Testing Reveals: Upper Extremities: Right: Full ROM and Muscle Strength 5/5 Left: Decreased ROM  30 Degrees and Muscle Strength 4/5 Thoracic Paraspinal Tenderness: T-3-T-6 Mainly Left Side Lumbar Paraspinal Tenderness: L-4-L-5 Right Greater Trochanter Tenderness Lower Extremities: Left: Full ROM and Muscle Strength 5/5 Left Lower Extremity Flexion Produces Pain into Lumbar Right Lower Extremity: Decreased ROM and Muscle Strength 4/5 Right Lower Extremity Flexion Produces Pain into Hip, Lumbar and lower extremity Arises from chair slowly, using cane for support Antalgic gait   Neurological: He  is alert and oriented to person, place, and time.  Skin: Skin is warm and dry.  Nursing note and vitals reviewed.         Assessment & Plan:  1. 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 Refilled:Hydrocodone 10/325 mg #120 pills--use every 6 hours as needed. 05/19/2018.  We will continue the opioid monitoring program, this consists of regular clinic visits, examinations, urine drug screen, pill counts as well as use of [place] Controlled Substance reporting System. 2. Lumbar Radiculitis: Continuecurrent medication regimen withGabapentin. 05/19/2018 3. Intercostal neuralgia: Unable to continue Flector Patch due to cost, Continuecurrent medication regimen withVoltaren Gel,Robaxin and Neurontin. 05/19/2018 4. Insomnia: Continuecurrent medication regime withtrazodone. 05/19/2018 5. Muscle Spasm: Continuecurrent medication regimen withRobaxin. 05/19/2018 6. Polyarthralgia: Continue current medication regime. Continue to Monitor. 05/19/2018 7. Acute Right Hip Pain on Chronic Right Hip Pain:RX: X-ray:  Continue HEP as Tolerated and Continue current medication regimen. 05/19/2018. 8. Left Rib Pain: Denies Falling: RX: X-ray.  9. Chronic Bilateral Thoracic Pain: Continue HEP and Continue Current medication regimen. Continue to monitor.   20 minutes of face to face patient care time was spent during this visit. All questions were encouraged and answered.  F/U in 1 month

## 2018-05-20 ENCOUNTER — Telehealth: Payer: Self-pay | Admitting: Registered Nurse

## 2018-05-20 ENCOUNTER — Telehealth: Payer: Self-pay

## 2018-05-20 DIAGNOSIS — M8700 Idiopathic aseptic necrosis of unspecified bone: Secondary | ICD-10-CM

## 2018-05-20 NOTE — Telephone Encounter (Signed)
ReviewedHerby Abraham Results: 05/20/2018 IMPRESSION: Degenerative changes of the RIGHT hip joint with joint space narrowing and subchondral cyst formation.  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  In 2012: On 01/08/10 Findings: Metallic markers project over the sacrum and L5 vertebral body on the right.Negative for fracture, dislocation, or other acute abnormality.  Normal alignment and mineralization. No significant degenerative change.  Regional soft tissues unremarkable.   IMPRESSION:   Negative  Discuss results with Dr. Riley Kill: Referral will be placed for Dr. Dion Saucier.  Spoke with Mr. Baugher he is aware of the above.

## 2018-06-14 ENCOUNTER — Encounter: Payer: Medicare HMO | Attending: Physical Medicine & Rehabilitation

## 2018-06-14 ENCOUNTER — Encounter: Payer: Self-pay | Admitting: Physical Medicine & Rehabilitation

## 2018-06-14 ENCOUNTER — Ambulatory Visit (HOSPITAL_BASED_OUTPATIENT_CLINIC_OR_DEPARTMENT_OTHER): Payer: Medicare HMO | Admitting: Physical Medicine & Rehabilitation

## 2018-06-14 VITALS — BP 132/89 | HR 89 | Resp 14 | Ht 69.0 in | Wt 237.0 lb

## 2018-06-14 DIAGNOSIS — G8929 Other chronic pain: Secondary | ICD-10-CM | POA: Diagnosis not present

## 2018-06-14 DIAGNOSIS — F17210 Nicotine dependence, cigarettes, uncomplicated: Secondary | ICD-10-CM | POA: Diagnosis not present

## 2018-06-14 DIAGNOSIS — M546 Pain in thoracic spine: Secondary | ICD-10-CM | POA: Diagnosis not present

## 2018-06-14 DIAGNOSIS — M4647 Discitis, unspecified, lumbosacral region: Secondary | ICD-10-CM | POA: Insufficient documentation

## 2018-06-14 DIAGNOSIS — M25551 Pain in right hip: Secondary | ICD-10-CM | POA: Insufficient documentation

## 2018-06-14 DIAGNOSIS — M8700 Idiopathic aseptic necrosis of unspecified bone: Secondary | ICD-10-CM | POA: Diagnosis not present

## 2018-06-14 DIAGNOSIS — G8921 Chronic pain due to trauma: Secondary | ICD-10-CM | POA: Insufficient documentation

## 2018-06-14 DIAGNOSIS — M5416 Radiculopathy, lumbar region: Secondary | ICD-10-CM | POA: Diagnosis present

## 2018-06-14 MED ORDER — HYDROCODONE-ACETAMINOPHEN 10-325 MG PO TABS: 1.0000 | ORAL_TABLET | Freq: Four times a day (QID) | ORAL | 0 refills | Status: DC | PRN
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
## 2018-06-14 NOTE — Patient Instructions (Addendum)
Non medication pain relief  Try ice 20min alternating with heating pad for 20 minutes every 2 hours as needed  Try TENs unit that can be puchased in a pharmacy for about 40.00, brands include Aleve or Icy Hot  Try various muscle cremes  Aspercreme or others that contain trolamine- this is anti inflammatory  Capsaicin creme which reduces Pain substance P  Lidocaine containing creme which has a numbing medicine  Menthol and camphor containing creme which has a cooling effect  If Dr Dion SaucierLandau does surgery , he can prescribe pain medication post operatively

## 2018-07-12 ENCOUNTER — Encounter: Payer: Medicare HMO | Admitting: Registered Nurse

## 2018-07-22 ENCOUNTER — Encounter: Payer: Medicare HMO | Attending: Physical Medicine & Rehabilitation | Admitting: Registered Nurse

## 2018-07-22 ENCOUNTER — Encounter: Payer: Self-pay | Admitting: Registered Nurse

## 2018-07-22 VITALS — BP 140/79 | HR 103 | Ht 69.0 in | Wt 237.0 lb

## 2018-07-22 DIAGNOSIS — G8929 Other chronic pain: Secondary | ICD-10-CM

## 2018-07-22 DIAGNOSIS — F17210 Nicotine dependence, cigarettes, uncomplicated: Secondary | ICD-10-CM | POA: Diagnosis not present

## 2018-07-22 DIAGNOSIS — M519 Unspecified thoracic, thoracolumbar and lumbosacral intervertebral disc disorder: Secondary | ICD-10-CM | POA: Diagnosis not present

## 2018-07-22 DIAGNOSIS — M25551 Pain in right hip: Secondary | ICD-10-CM

## 2018-07-22 DIAGNOSIS — M546 Pain in thoracic spine: Secondary | ICD-10-CM

## 2018-07-22 DIAGNOSIS — M8700 Idiopathic aseptic necrosis of unspecified bone: Secondary | ICD-10-CM

## 2018-07-22 DIAGNOSIS — G8921 Chronic pain due to trauma: Secondary | ICD-10-CM | POA: Diagnosis not present

## 2018-07-22 DIAGNOSIS — M4647 Discitis, unspecified, lumbosacral region: Secondary | ICD-10-CM | POA: Insufficient documentation

## 2018-07-22 DIAGNOSIS — M5416 Radiculopathy, lumbar region: Secondary | ICD-10-CM | POA: Insufficient documentation

## 2018-07-22 DIAGNOSIS — M62838 Other muscle spasm: Secondary | ICD-10-CM

## 2018-07-22 DIAGNOSIS — R0781 Pleurodynia: Secondary | ICD-10-CM

## 2018-07-22 DIAGNOSIS — G894 Chronic pain syndrome: Secondary | ICD-10-CM

## 2018-07-22 DIAGNOSIS — Z79891 Long term (current) use of opiate analgesic: Secondary | ICD-10-CM

## 2018-07-22 DIAGNOSIS — M2550 Pain in unspecified joint: Secondary | ICD-10-CM

## 2018-07-22 DIAGNOSIS — Z5181 Encounter for therapeutic drug level monitoring: Secondary | ICD-10-CM

## 2018-07-22 MED ORDER — HYDROCODONE-ACETAMINOPHEN 10-325 MG PO TABS: 1.0000 | ORAL_TABLET | Freq: Four times a day (QID) | ORAL | 0 refills | Status: DC | PRN

## 2018-07-22 NOTE — Progress Notes (Signed)
Subjective:    Patient ID: Matthew Sloan, male    DOB: [birth year], 58 y.o.   MRN: [id number]  HPI: Matthew Sloan is a 58 y.o. male who returns for follow up appointment for chronic pain and medication refill. He states his pain is located in his mid- lower back radiating into his right hip and right lower extremity and right foot pain. Also reports generalized joint pain all over. He rates his pain 9 . His  current exercise regime is walking and performing stretching exercises.  Matthew Sloan Morphine equivalent is 40.00 MME.  Last UDS was Performed on 01/21/2018, it was consistent.   Pain Inventory Average Pain 8 Pain Right Now 9 My pain is constant  In the last 24 hours, has pain interfered with the following? General activity 10 Relation with others 8 Enjoyment of life 8 What TIME of day is your pain at its worst? all Sleep (in general) Poor  Pain is worse with: walking, bending, sitting, inactivity and standing Pain improves with: medication Relief from Meds: 2  Mobility walk with assistance use a cane Do you have any goals in this area?  yes  Function disabled: date disabled . Do you have any goals in this area?  yes  Neuro/Psych weakness numbness tremor tingling trouble walking spasms  Prior Studies Any changes since last visit?  no  Physicians involved in your care Any changes since last visit?  no   Family History  Problem Relation Age of Onset  . Cancer Mother 8345       breast cancer  . Heart disease Father 6472       CHF   Social History   Socioeconomic History  . Marital status: Single    Spouse name: Not on file  . Number of children: Not on file  . Years of education: Not on file  . Highest education level: Not on file  Occupational History  . Not on file  Social Needs  . Financial resource strain: Not on file  . Food insecurity:    Worry: Not on file    Inability: Not on file  . Transportation needs:    Medical: Not on file   Non-medical: Not on file  Tobacco Use  . Smoking status: Current Every Day Smoker    Packs/day: 1.00    Years: 30.00    Pack years: 30.00    Types: Cigarettes  . Smokeless tobacco: Never Used  . Tobacco comment: working on it-down to 0.5 ppd  Substance and Sexual Activity  . Alcohol use: No  . Drug use: No  . Sexual activity: Not on file  Lifestyle  . Physical activity:    Days per week: Not on file    Minutes per session: Not on file  . Stress: Not on file  Relationships  . Social connections:    Talks on phone: Not on file    Gets together: Not on file    Attends religious service: Not on file    Active member of club or organization: Not on file    Attends meetings of clubs or organizations: Not on file    Relationship status: Not on file  Other Topics Concern  . Not on file  Social History Narrative  . Not on file   Past Surgical History:  Procedure Laterality Date  . bilateral tubes placed in ears  04/12/2012  . CARPAL TUNNEL RELEASE  12/15/2011   Procedure: CARPAL TUNNEL RELEASE;  Surgeon: Teena IraniKevin R  Merlyn LotKuzma, MD;  Location: [facility];  Service: Orthopedics;  Laterality: Left;  [name]. MULTIPLE TOOTH EXTRACTIONS    . SHOULDER SURGERY  2012   lt  . ULNAR TUNNEL RELEASE  12/15/2011   Procedure: CUBITAL TUNNEL RELEASE;  Surgeon: Tami RibasKevin R Kuzma, MD;  Location: [facility];  Service: Orthopedics;  Laterality: Left;  left cubital tunnel release   Past Medical History:  Diagnosis Date  . Allergy   . Chronic pain due to trauma   . Disturbance of skin sensation   . Intercostal neuralgia   . Lumbosacral neuritis   . Myocardial infarction (HCC)   . Rotator cuff (capsule) sprain   . Snores    BP 140/79   Pulse (!) 103   Ht 5\' 9"  (1.753 m) Comment: previous  Wt 237 lb (107.5 kg) Comment: previous  SpO2 91%   BMI 35.00 kg/m   Opioid Risk Score:   Fall Risk Score:  `1  Depression screen PHQ 2/9  Dep[facility] 04/18/2018 12/23/2017  11/19/2017 08/23/2017 07/20/2017 06/25/2017 03/24/2017  Decreased Interest 0 0 0 0 0 0 0  Down, Depressed, Hopeless 0 0 0 0 0 0 0  PHQ - 2 Score 0 0 0 0 0 0 0  Altered sleeping - - - - - - -  Tired, decreased energy - - - - - - -  Feeling bad or failure about yourself  - - - - - - -  Trouble concentrating - - - - - - -  Moving slowly or fidgety/restless - - - - - - -  Suicidal thoughts - - - - - - -  PHQ-9 Score - - - - - - -    Review of Systems  Constitutional: Negative.   HENT: Negative.   Eyes: Negative.   Respiratory: Negative.   Cardiovascular: Negative.   Gastrointestinal: Negative.   Endocrine: Negative.   Genitourinary: Negative.   Musculoskeletal: Positive for arthralgias, back pain, gait problem, myalgias, neck pain and neck stiffness.       Spasms   Skin: Negative.   Allergic/Immunologic: Negative.   Neurological: Positive for tremors and weakness.       Tingling  Hematological: Negative.   Psychiatric/Behavioral: Negative.   All other systems reviewed and are negative.      Objective:   Physical Exam Vitals signs and nursing note reviewed.  Constitutional:      Appearance: Normal appearance.  Neck:     Musculoskeletal: Normal range of motion and neck supple.  Cardiovascular:     Rate and Rhythm: Normal rate and regular rhythm.     Pulses: Normal pulses.     Heart sounds: Normal heart sounds.  Musculoskeletal:     Comments: Normal Muscle Bulk and Muscle Testing Reveals:  Upper Extremities: Right: Full ROM and Muscle Strength 5/5  Left: 45 Decreased ROM 45 Degrees and Muscle Strength 5/5   Thoracic Paraspinal Tenderness: T-7-T-9  Lumbar Hypersensitivity Right Greater Trochanter Tenderness Lower Extremities: Right: Decreased ROM and Muscle Strength 4/5 Right Lower Extremity Flexion Produces Pain into Right Hip and Lumbar Left Lower Extremity: Full ROM and Muscle Strength 5/5 Bilateral Lower Extremities Flexion Produces Tingling and Numbness into his  Bilateral feet Arises from chair slowly using cane for support Antalgic Gait   Skin:    General: Skin is warm and dry.  Neurological:     Mental Status: He is alert and oriented to person, place, and time.  Psychiatric:  Mood and Affect: Mood normal.        Behavior: Behavior normal.           Assessment & Plan:  1. 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 Refilled:Hydrocodone 10/325 mg #120 pills--use every 6 hours as needed. 07/22/2018. We will continue the opioid monitoring program, this consists of regular clinic visits, examinations, urine drug screen, pill counts as well as use of [place] Controlled Substance reporting System. 2. Lumbar Radiculitis: Continuecurrent medication regimen withGabapentin.07/22/2018 3. Intercostal neuralgia: Unable to continue Flector Patch due to cost, Continuecurrent medication regimen withVoltaren Gel,Robaxin and Neurontin.07/22/2018 4. Insomnia: Continuecurrent medication regime withtrazodone.07/22/2018 5. Muscle Spasm: Continuecurrent medication regimen withRobaxin.07/22/2018 6. Polyarthralgia: Continue current medication regime. Continue to Monitor.07/22/2018 7. Chronic Bilateral Thoracic Pain: Continue HEP and Continue Current medication regimen. Continue to monitor. 07/22/2018 8. Chronic Right Hip Pain/ Avascular Necrosis: Awaiting Schedule Appointment with Orthopedist.   20 minutes of face to face patient care time was spent during this visit. All questions were encouraged and answered.  F/U in 1 month

## 2018-08-19 ENCOUNTER — Telehealth: Payer: Self-pay | Admitting: Registered Nurse

## 2018-08-19 ENCOUNTER — Encounter: Payer: Medicare HMO | Admitting: Registered Nurse

## 2018-08-19 MED ORDER — HYDROCODONE-ACETAMINOPHEN 10-325 MG PO TABS: 1.0000 | ORAL_TABLET | Freq: Four times a day (QID) | ORAL | 0 refills | Status: DC | PRN

## 2018-08-19 NOTE — Telephone Encounter (Signed)
[place] block. PMP was reviewed. Hydrocodone e-scribed. His appointment changed to 08/26/2018. He verbalizes understanding.

## 2018-08-26 ENCOUNTER — Encounter: Payer: Medicare HMO | Attending: Physical Medicine & Rehabilitation | Admitting: Registered Nurse

## 2018-08-26 ENCOUNTER — Encounter: Payer: Self-pay | Admitting: Registered Nurse

## 2018-08-26 VITALS — BP 116/80 | HR 85 | Ht 69.0 in | Wt 242.0 lb

## 2018-08-26 DIAGNOSIS — M4647 Discitis, unspecified, lumbosacral region: Secondary | ICD-10-CM | POA: Diagnosis not present

## 2018-08-26 DIAGNOSIS — F17210 Nicotine dependence, cigarettes, uncomplicated: Secondary | ICD-10-CM | POA: Diagnosis not present

## 2018-08-26 DIAGNOSIS — M546 Pain in thoracic spine: Secondary | ICD-10-CM

## 2018-08-26 DIAGNOSIS — M2550 Pain in unspecified joint: Secondary | ICD-10-CM

## 2018-08-26 DIAGNOSIS — M519 Unspecified thoracic, thoracolumbar and lumbosacral intervertebral disc disorder: Secondary | ICD-10-CM

## 2018-08-26 DIAGNOSIS — M25551 Pain in right hip: Secondary | ICD-10-CM | POA: Diagnosis present

## 2018-08-26 DIAGNOSIS — Z79891 Long term (current) use of opiate analgesic: Secondary | ICD-10-CM

## 2018-08-26 DIAGNOSIS — G8921 Chronic pain due to trauma: Secondary | ICD-10-CM | POA: Diagnosis not present

## 2018-08-26 DIAGNOSIS — G8929 Other chronic pain: Secondary | ICD-10-CM | POA: Diagnosis not present

## 2018-08-26 DIAGNOSIS — M25511 Pain in right shoulder: Secondary | ICD-10-CM

## 2018-08-26 DIAGNOSIS — M5416 Radiculopathy, lumbar region: Secondary | ICD-10-CM

## 2018-08-26 DIAGNOSIS — Z5181 Encounter for therapeutic drug level monitoring: Secondary | ICD-10-CM

## 2018-08-26 DIAGNOSIS — M25512 Pain in left shoulder: Secondary | ICD-10-CM

## 2018-08-26 DIAGNOSIS — M8700 Idiopathic aseptic necrosis of unspecified bone: Secondary | ICD-10-CM

## 2018-08-26 DIAGNOSIS — G894 Chronic pain syndrome: Secondary | ICD-10-CM

## 2018-08-26 MED ORDER — HYDROCODONE-ACETAMINOPHEN 10-325 MG PO TABS: 1.0000 | ORAL_TABLET | Freq: Four times a day (QID) | ORAL | 0 refills | Status: DC | PRN

## 2018-08-26 NOTE — Progress Notes (Signed)
Subjective:     Patient ID: Matthew Sloan, male   DOB: 1961-06-12, 58 y.o.   MRN: [id number]  HPI: Matthew Sloan is a 58 y.o. male who returns for follow up appointment for chronic pain and medication refill. He states his pain is located in his bilateral shoulders, mid- back, lower back pain radiating into his right hip and right lower extremities. He rates his pain  9. His.current exercise regime is walking and performing stretching exercises with bands and ball therapy.  Matthew Sloan has gaines 12 ilbs over the last four months and has gained 5lbs in the last. He was encouraged to F/U with his PCP, he verbalizes understanding.   Matthew Sloan Morphine equivalent is 40.00 MME.  Last UDS was Performed on 01/10/2018, it was consistent.   Pain Inventory Average Pain 8 Pain Right Now 9 My pain is sharp, burning, stabbing, tingling and aching  In the last 24 hours, has pain interfered with the following? General activity 10 Relation with others 8 Enjoyment of life 8 What TIME of day is your pain at its worst? all Sleep (in general) Poor  Pain is worse with: walking, bending, sitting, inactivity and standing Pain improves with: medication Relief from Meds: 2  Mobility walk with assistance use a cane Do you have any goals in this area?  yes  Function disabled: date disabled . Do you have any goals in this area?  yes  Neuro/Psych weakness numbness tremor tingling trouble walking spasms dizziness  Prior Studies Any changes since last visit?  no  Physicians involved in your care Any changes since last visit?  no   Family History  Problem Relation Age of Onset  . Cancer Mother 2       breast cancer  . Heart disease Father 24       CHF   Social History   Socioeconomic History  . Marital status: Single    Spouse name: Not on file  . Number of children: Not on file  . Years of education: Not on file  . Highest education level: Not on file  Occupational History  .  Not on file  Social Needs  . Financial resource strain: Not on file  . Food insecurity:    Worry: Not on file    Inability: Not on file  . Transportation needs:    Medical: Not on file    Non-medical: Not on file  Tobacco Use  . Smoking status: Current Every Day Smoker    Packs/day: 1.00    Years: 30.00    Pack years: 30.00    Types: Cigarettes  . Smokeless tobacco: Never Used  . Tobacco comment: working on it-down to 0.5 ppd  Substance and Sexual Activity  . Alcohol use: No  . Drug use: No  . Sexual activity: Not on file  Lifestyle  . Physical activity:    Days per week: Not on file    Minutes per session: Not on file  . Stress: Not on file  Relationships  . Social connections:    Talks on phone: Not on file    Gets together: Not on file    Attends religious service: Not on file    Active member of club or organization: Not on file    Attends meetings of clubs or organizations: Not on file    Relationship status: Not on file  Other Topics Concern  . Not on file  Social History Narrative  . Not on file  Past Surgical History:  Procedure Laterality Date  . bilateral tubes placed in ears  04/12/2012  . CARPAL TUNNEL RELEASE  12/15/2011   Procedure: CARPAL TUNNEL RELEASE;  Surgeon: Tami RibasKevin R Kuzma, MD;  Location: [facility];  Service: Orthopedics;  Laterality: Left;  [name]. MULTIPLE TOOTH EXTRACTIONS    . SHOULDER SURGERY  2012   lt  . ULNAR TUNNEL RELEASE  12/15/2011   Procedure: CUBITAL TUNNEL RELEASE;  Surgeon: Tami RibasKevin R Kuzma, MD;  Location: [facility];  Service: Orthopedics;  Laterality: Left;  left cubital tunnel release   Past Medical History:  Diagnosis Date  . Allergy   . Chronic pain due to trauma   . Disturbance of skin sensation   . Intercostal neuralgia   . Lumbosacral neuritis   . Myocardial infarction (HCC)   . Rotator cuff (capsule) sprain   . Snores    BP 116/80   Pulse 85   Ht 5\' 9"  (1.753 m)   Wt 242 lb (109.8 kg)    SpO2 93%   BMI 35.74 kg/m   Opioid Risk Score:   Fall Risk Score:  `1  Depression screen PHQ 2/9  Dep[facility] 04/18/2018 12/23/2017 11/19/2017 08/23/2017 07/20/2017 06/25/2017 03/24/2017  Decreased Interest 0 0 0 0 0 0 0  Down, Depressed, Hopeless 0 0 0 0 0 0 0  PHQ - 2 Score 0 0 0 0 0 0 0  Altered sleeping - - - - - - -  Tired, decreased energy - - - - - - -  Feeling bad or failure about yourself  - - - - - - -  Trouble concentrating - - - - - - -  Moving slowly or fidgety/restless - - - - - - -  Suicidal thoughts - - - - - - -  PHQ-9 Score - - - - - - -     Review of Systems  Constitutional: Negative.   HENT: Negative.   Eyes: Negative.   Respiratory: Negative.   Cardiovascular: Negative.   Gastrointestinal: Negative.   Endocrine: Negative.   Genitourinary: Negative.   Musculoskeletal: Positive for arthralgias, back pain, gait problem, neck pain and neck stiffness.       Spasms   Skin: Negative.   Allergic/Immunologic: Negative.   Neurological: Positive for dizziness, tremors, weakness and numbness.       Tingling  Hematological: Negative.   Psychiatric/Behavioral: Negative.   All other systems reviewed and are negative.      Objective:   Physical Exam Constitutional:      Appearance: He is obese.  Neck:     Musculoskeletal: Normal range of motion and neck supple.  Cardiovascular:     Rate and Rhythm: Normal rate and regular rhythm.     Pulses: Normal pulses.     Heart sounds: Normal heart sounds.  Pulmonary:     Effort: Pulmonary effort is normal.     Breath sounds: Normal breath sounds.  Musculoskeletal:     Comments: Normal Muscle Bulk and Muscle Testing Reveals:  Upper Extremities: Right: Full ROM and Muscle Strength 5/5 Left: Decreased ROM 45 Degrees and Muscle Strength 5/5 Thoracic Paraspinal Tenderness: T-10- T-12 Lumbar Hypersensitivity Right Greater Trochanter Tenderness Lower Extremities: Right: Decreased ROM and Muscle Strength  4/5 Right Lower Extremity Flexion Produces Pain into groin, right hip and back Left Lower Extremity: Full ROM an Muscle Strength 5/5 Arises from chair slowly using cane for support Antalgic Gait   Skin:  General: Skin is warm and dry.  Neurological:     Mental Status: He is oriented to person, place, and time.  Psychiatric:        Mood and Affect: Mood normal.        Behavior: Behavior normal.        Assessment: Plan  1. 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 Refilled:Hydrocodone 10/325 mg #120 pills--use every 6 hours as needed. 08/26/2018. We will continue the opioid monitoring program, this consists of regular clinic visits, examinations, urine drug screen, pill counts as well as use of [place] Controlled Substance reporting System. 2. Lumbar Radiculitis: Continuecurrent medication regimen withGabapentin.08/26/2018 3. Intercostal neuralgia: Unable to continue Flector Patch due to cost, Continuecurrent medication regimen withVoltaren Gel,Robaxin and Neurontin.08/26/2018 4. Insomnia: Continuecurrent medication regime withtrazodone.08/26/2018 5. Muscle Spasm: Continuecurrent medication regimen withRobaxin.08/26/2018 6. Polyarthralgia: Continue current medication regime. Continue to Monitor.08/26/2018 7. Chronic Bilateral Thoracic Pain: Continue HEP and Continue Current medication regimen. Continue to monitor.08/26/2018 8. Chronic Right Hip Pain/ Avascular Necrosis: Awaiting Schedule Appointment with Orthopedist Dr. Dion Saucier.   20 minutes of face to face patient care time was spent during this visit. All questions were encouraged and answered.  F/U in 1 month

## 2018-09-23 ENCOUNTER — Ambulatory Visit: Payer: Self-pay | Admitting: Registered Nurse

## 2018-09-30 ENCOUNTER — Ambulatory Visit: Payer: Self-pay | Admitting: Registered Nurse

## 2018-09-30 ENCOUNTER — Encounter: Payer: Medicare HMO | Admitting: Registered Nurse

## 2018-10-19 ENCOUNTER — Encounter: Payer: Self-pay | Admitting: Registered Nurse

## 2018-10-19 ENCOUNTER — Encounter: Payer: Medicare HMO | Attending: Physical Medicine & Rehabilitation | Admitting: Registered Nurse

## 2018-10-19 ENCOUNTER — Other Ambulatory Visit: Payer: Self-pay

## 2018-10-19 VITALS — Ht 69.0 in | Wt 242.0 lb

## 2018-10-19 DIAGNOSIS — F17210 Nicotine dependence, cigarettes, uncomplicated: Secondary | ICD-10-CM | POA: Insufficient documentation

## 2018-10-19 DIAGNOSIS — M25511 Pain in right shoulder: Secondary | ICD-10-CM | POA: Diagnosis not present

## 2018-10-19 DIAGNOSIS — G8929 Other chronic pain: Secondary | ICD-10-CM

## 2018-10-19 DIAGNOSIS — Z79891 Long term (current) use of opiate analgesic: Secondary | ICD-10-CM

## 2018-10-19 DIAGNOSIS — M2550 Pain in unspecified joint: Secondary | ICD-10-CM

## 2018-10-19 DIAGNOSIS — M4647 Discitis, unspecified, lumbosacral region: Secondary | ICD-10-CM | POA: Insufficient documentation

## 2018-10-19 DIAGNOSIS — M5416 Radiculopathy, lumbar region: Secondary | ICD-10-CM

## 2018-10-19 DIAGNOSIS — M8700 Idiopathic aseptic necrosis of unspecified bone: Secondary | ICD-10-CM

## 2018-10-19 DIAGNOSIS — M25512 Pain in left shoulder: Secondary | ICD-10-CM

## 2018-10-19 DIAGNOSIS — M546 Pain in thoracic spine: Secondary | ICD-10-CM

## 2018-10-19 DIAGNOSIS — M62838 Other muscle spasm: Secondary | ICD-10-CM

## 2018-10-19 DIAGNOSIS — M519 Unspecified thoracic, thoracolumbar and lumbosacral intervertebral disc disorder: Secondary | ICD-10-CM | POA: Diagnosis not present

## 2018-10-19 DIAGNOSIS — G894 Chronic pain syndrome: Secondary | ICD-10-CM

## 2018-10-19 DIAGNOSIS — Z5181 Encounter for therapeutic drug level monitoring: Secondary | ICD-10-CM

## 2018-10-19 DIAGNOSIS — G8921 Chronic pain due to trauma: Secondary | ICD-10-CM

## 2018-10-19 DIAGNOSIS — M25551 Pain in right hip: Secondary | ICD-10-CM

## 2018-10-19 MED ORDER — GABAPENTIN 800 MG PO TABS: 800.0000 mg | ORAL_TABLET | Freq: Three times a day (TID) | ORAL | 5 refills | Status: DC

## 2018-10-19 MED ORDER — HYDROCODONE-ACETAMINOPHEN 10-325 MG PO TABS: 1.0000 | ORAL_TABLET | Freq: Four times a day (QID) | ORAL | 0 refills | Status: DC | PRN

## 2018-10-19 NOTE — Progress Notes (Signed)
Subjective:    Patient ID: Matthew Sloan, male    DOB: 1960-09-29, 58 y.o.   MRN: [id number]  HPI: Matthew Sloan is a 58 y.o. male his appointment was changed, due to national recommendations of social distancing due to COVID 19, an audio/video telehealth visit is felt to be most appropriate for this patient at this time.  See Chart message from today for the patient's consent to telehealth from [facility].     He states his pain is located in his bilateral shoulders, mid-lower back pain radiating into his right hip and right lower extremity. Also reports generalized joint pain. He rates his  Pain 8. His current exercise regime is walking and performing stretching exercises.  Angelica Chessman CMA asked the Health History Questions. This provider and Angelica Chessman verified we were speaking with the correct person using two identifiers.  Matthew Sloan equivalent is 40.00 MME. Last UDS was performed on 01/21/2018, was consistent.   Pain Inventory Average Pain 8 Pain Right Now 8 My pain is constant and aching  In the last 24 hours, has pain interfered with the following? General activity 8 Relation with others 8 Enjoyment of life 8 What TIME of day is your pain at its worst? varies Sleep (in general) Fair  Pain is worse with: walking, standing and some activites Pain improves with: rest, pacing activities and medication Relief from Meds: 3  Mobility walk with assistance use a cane how many minutes can you walk? 0 ability to climb steps?  no do you drive?  yes  Function disabled: date disabled na I need assistance with the following:  household duties  Neuro/Psych trouble walking  Prior Studies Any changes since last visit?  no  Physicians involved in your care Any changes since last visit?  no   Family History  Problem Relation Age of Onset  . Cancer Mother 41       breast cancer  . Heart disease Father 70       CHF   Social  History   Socioeconomic History  . Marital status: Single    Spouse name: Not on file  . Number of children: Not on file  . Years of education: Not on file  . Highest education level: Not on file  Occupational History  . Not on file  Social Needs  . Financial resource strain: Not on file  . Food insecurity:    Worry: Not on file    Inability: Not on file  . Transportation needs:    Medical: Not on file    Non-medical: Not on file  Tobacco Use  . Smoking status: Current Every Day Smoker    Packs/day: 1.00    Years: 30.00    Pack years: 30.00    Types: Cigarettes  . Smokeless tobacco: Never Used  . Tobacco comment: working on it-down to 0.5 ppd  Substance and Sexual Activity  . Alcohol use: No  . Drug use: No  . Sexual activity: Not on file  Lifestyle  . Physical activity:    Days per week: Not on file    Minutes per session: Not on file  . Stress: Not on file  Relationships  . Social connections:    Talks on phone: Not on file    Gets together: Not on file    Attends religious service: Not on file    Active member of club or organization: Not on file    Attends  meetings of clubs or organizations: Not on file    Relationship status: Not on file  Other Topics Concern  . Not on file  Social History Narrative  . Not on file   Past Surgical History:  Procedure Laterality Date  . bilateral tubes placed in ears  04/12/2012  . CARPAL TUNNEL RELEASE  12/15/2011   Procedure: CARPAL TUNNEL RELEASE;  Surgeon: [name], MD;  Location: [facility];  Service: Orthopedics;  Laterality: Left;  [name] MULTIPLE TOOTH EXTRACTIONS    . SHOULDER SURGERY  2012   lt  . ULNAR TUNNEL RELEASE  12/15/2011   Procedure: CUBITAL TUNNEL RELEASE;  Surgeon: [name], MD;  Location: [facility];  Service: Orthopedics;  Laterality: Left;  left cubital tunnel release   Past Medical History:  Diagnosis Date  . Allergy   . Chronic pain due to trauma   . Disturbance  of skin sensation   . Intercostal neuralgia   . Lumbosacral neuritis   . Myocardial infarction (HCC)   . Rotator cuff (capsule) sprain   . Snores    Ht 5\' 9"  (1.753 m)   Wt 242 lb (109.8 kg)   BMI 35.74 kg/m   Opioid Risk Score:   Fall Risk Score:  `1  Depression screen PHQ 2/9  Depression screen [facility] 2/9 10/19/2018 04/18/2018 12/23/2017 11/19/2017 08/23/2017 07/20/2017 06/25/2017  Decreased Interest 0 0 0 0 0 0 0  Down, Depressed, Hopeless 0 0 0 0 0 0 0  PHQ - 2 Score 0 0 0 0 0 0 0  Altered sleeping - - - - - - -  Tired, decreased energy - - - - - - -  Feeling bad or failure about yourself  - - - - - - -  Trouble concentrating - - - - - - -  Moving slowly or fidgety/restless - - - - - - -  Suicidal thoughts - - - - - - -  PHQ-9 Score - - - - - - -   Review of Systems  Constitutional: Negative.   HENT: Negative.   Eyes: Negative.   Respiratory: Positive for cough.   Cardiovascular: Negative.   Gastrointestinal: Negative.   Endocrine: Negative.   Genitourinary: Negative.   Musculoskeletal: Positive for back pain and myalgias. Negative for neck pain.  Skin: Negative.   Neurological: Negative.   Psychiatric/Behavioral: Negative.   All other systems reviewed and are negative.      Objective:   Physical Exam Vitals signs and nursing note reviewed.  Neurological:     Mental Status: He is oriented to person, place, and time.           Assessment & Plan:  1. 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 Refilled:Hydrocodone 10/325 mg #120 pills--use every 6 hours as needed. 10/19/2018. We will continue the opioid monitoring program, this consists of regular clinic visits, examinations, urine drug screen, pill counts as well as use of [place] Controlled Substance reporting System. 2. Lumbar Radiculitis: Continuecurrent medication regimen  withGabapentin.10/19/2018 3. Intercostal neuralgia: Unable to continue Flector Patch due to cost, Continuecurrent medication regimen withVoltaren Gel,Robaxin and Neurontin.10/19/2018 4. Insomnia: Continuecurrent medication regime withtrazodone.10/19/2018 5. Muscle Spasm: Continuecurrent medication regimen withRobaxin.10/19/2018 6. Polyarthralgia: Continue current medication regime. Continue to Monitor.10/19/2018 7. Chronic Bilateral Thoracic Pain: Continue HEP and Continue Current medication regimen. Continue to monitor.10/19/2018 8. Chronic Right Hip Pain/ Avascular Necrosis:  Awaiting Schedule Appointment with Orthopedist Dr. Dion SaucierLandau.[name]. 10/19/2018  F/U in 1 month  Location of patient: Home Location of provider: Office Time spent on call: 15 minutes

## 2018-11-10 ENCOUNTER — Encounter: Payer: Self-pay | Admitting: Registered Nurse

## 2018-11-10 ENCOUNTER — Other Ambulatory Visit: Payer: Self-pay

## 2018-11-10 ENCOUNTER — Encounter (HOSPITAL_BASED_OUTPATIENT_CLINIC_OR_DEPARTMENT_OTHER): Payer: Medicare HMO | Admitting: Registered Nurse

## 2018-11-10 VITALS — Ht 69.0 in | Wt 242.0 lb

## 2018-11-10 DIAGNOSIS — M2550 Pain in unspecified joint: Secondary | ICD-10-CM

## 2018-11-10 DIAGNOSIS — M25551 Pain in right hip: Secondary | ICD-10-CM

## 2018-11-10 DIAGNOSIS — M62838 Other muscle spasm: Secondary | ICD-10-CM

## 2018-11-10 DIAGNOSIS — M519 Unspecified thoracic, thoracolumbar and lumbosacral intervertebral disc disorder: Secondary | ICD-10-CM | POA: Diagnosis not present

## 2018-11-10 DIAGNOSIS — G8929 Other chronic pain: Secondary | ICD-10-CM

## 2018-11-10 DIAGNOSIS — M5416 Radiculopathy, lumbar region: Secondary | ICD-10-CM | POA: Diagnosis not present

## 2018-11-10 DIAGNOSIS — G894 Chronic pain syndrome: Secondary | ICD-10-CM

## 2018-11-10 DIAGNOSIS — M546 Pain in thoracic spine: Secondary | ICD-10-CM | POA: Diagnosis not present

## 2018-11-10 DIAGNOSIS — M8700 Idiopathic aseptic necrosis of unspecified bone: Secondary | ICD-10-CM

## 2018-11-10 DIAGNOSIS — Z5181 Encounter for therapeutic drug level monitoring: Secondary | ICD-10-CM

## 2018-11-10 DIAGNOSIS — G8921 Chronic pain due to trauma: Secondary | ICD-10-CM

## 2018-11-10 DIAGNOSIS — Z79891 Long term (current) use of opiate analgesic: Secondary | ICD-10-CM

## 2018-11-10 MED ORDER — HYDROCODONE-ACETAMINOPHEN 10-325 MG PO TABS: 1.0000 | ORAL_TABLET | Freq: Four times a day (QID) | ORAL | 0 refills | Status: DC | PRN

## 2018-11-10 NOTE — Progress Notes (Signed)
Subjective:    Patient ID: Matthew Sloan, male    DOB: Jun 20, 1961, 58 y.o.   MRN: [id number]  HPI: Matthew Sloan is a 58 y.o. male his appointment was changed, due to national recommendations of social distancing due to COVID 19, an audio/video telehealth visit is felt to be most appropriate for this patient at this time.  See Chart message from today for the patient's consent to telehealth from [facility].   He  states his pain is located in his mid- lower back pain radiating into his right hip and right lower extremity. He rates his pain 9. His  current exercise regime is walking and performing stretching exercises.  Mr. Martis Morphine equivalent is 40.00 MME.  Last UDS was Performed on 01/21/2018, it was consistent.   Silas Sacramento CMA asked the Health and History Questions. This provider and Angelica Chessman verified we were speaking with the correct person using two identifiers.  Pain Inventory Average Pain 9 Pain Right Now 9 My pain is constant and aching  In the last 24 hours, has pain interfered with the following? General activity 9 Relation with others 9 Enjoyment of life 9 What TIME of day is your pain at its worst? varies Sleep (in general) Fair  Pain is worse with: walking, standing and some activites Pain improves with: rest, pacing activities and medication Relief from Meds: 3  Mobility walk with assistance use a cane how many minutes can you walk? 0 ability to climb steps?  no do you drive?  yes  Function disabled: date disabled na I need assistance with the following:  household duties  Neuro/Psych weakness numbness trouble walking  Prior Studies Any changes since last visit?  no  Physicians involved in your care Any changes since last visit?  no   Family History  Problem Relation Age of Onset  . Cancer Mother 31       breast cancer  . Heart disease Father 30       CHF   Social History   Socioeconomic  History  . Marital status: Single    Spouse name: Not on file  . Number of children: Not on file  . Years of education: Not on file  . Highest education level: Not on file  Occupational History  . Not on file  Social Needs  . Financial resource strain: Not on file  . Food insecurity:    Worry: Not on file    Inability: Not on file  . Transportation needs:    Medical: Not on file    Non-medical: Not on file  Tobacco Use  . Smoking status: Current Every Day Smoker    Packs/day: 1.00    Years: 30.00    Pack years: 30.00    Types: Cigarettes  . Smokeless tobacco: Never Used  . Tobacco comment: working on it-down to 0.5 ppd  Substance and Sexual Activity  . Alcohol use: No  . Drug use: No  . Sexual activity: Not on file  Lifestyle  . Physical activity:    Days per week: Not on file    Minutes per session: Not on file  . Stress: Not on file  Relationships  . Social connections:    Talks on phone: Not on file    Gets together: Not on file    Attends religious service: Not on file    Active member of club or organization: Not on file    Attends meetings of  clubs or organizations: Not on file    Relationship status: Not on file  Other Topics Concern  . Not on file  Social History Narrative  . Not on file   Past Surgical History:  Procedure Laterality Date  . bilateral tubes placed in ears  04/12/2012  . CARPAL TUNNEL RELEASE  12/15/2011   Procedure: CARPAL TUNNEL RELEASE;  Surgeon: Tami RibasKevin R Kuzma, MD;  Location: [facility];  Service: Orthopedics;  Laterality: Left;  [name]. MULTIPLE TOOTH EXTRACTIONS    . SHOULDER SURGERY  2012   lt  . ULNAR TUNNEL RELEASE  12/15/2011   Procedure: CUBITAL TUNNEL RELEASE;  Surgeon: Tami RibasKevin R Kuzma, MD;  Location: [facility];  Service: Orthopedics;  Laterality: Left;  left cubital tunnel release   Past Medical History:  Diagnosis Date  . Allergy   . Chronic pain due to trauma   . Disturbance of skin sensation   .  Intercostal neuralgia   . Lumbosacral neuritis   . Myocardial infarction (HCC)   . Rotator cuff (capsule) sprain   . Snores    Ht 5\' 9"  (1.753 m)   Wt 242 lb (109.8 kg)   BMI 35.74 kg/m   Opioid Risk Score:   Fall Risk Score:  `1  Depression screen PHQ 2/9  [facility] 11/10/2018 10/19/2018 04/18/2018 12/23/2017 11/19/2017 08/23/2017 07/20/2017  Decreased Interest 0 0 0 0 0 0 0  Down, Depressed, Hopeless 0 0 0 0 0 0 0  PHQ - 2 Score 0 0 0 0 0 0 0  Altered sleeping - - - - - - -  Tired, decreased energy - - - - - - -  Feeling bad or failure about yourself  - - - - - - -  Trouble concentrating - - - - - - -  Moving slowly or fidgety/restless - - - - - - -  Suicidal thoughts - - - - - - -  PHQ-9 Score - - - - - - -    Review of Systems  HENT: Positive for sinus pressure, sinus pain and sneezing.   Eyes: Negative.   Respiratory: Positive for cough.   Cardiovascular: Negative.   Gastrointestinal: Negative.   Endocrine: Negative.   Genitourinary: Negative.   Musculoskeletal: Positive for arthralgias and back pain.       Leg pain Hip pain   Skin: Negative.   Allergic/Immunologic: Negative.   Neurological: Positive for weakness and numbness.  Psychiatric/Behavioral: Negative.   All other systems reviewed and are negative.      Objective:   Physical Exam Vitals signs and nursing note reviewed.  Musculoskeletal:     Comments: No Physical Exam Perform: Virtual Visit  Neurological:     Mental Status: He is oriented to person, place, and time.           Assessment & Plan:  1. 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 Refilled:Hydrocodone 10/325 mg #120 pills--use every 6 hours as needed. 11/10/2018. We will continue the opioid monitoring program, this consists of regular clinic visits, examinations, urine drug screen, pill counts as well  as use of [place] Controlled [place]stance reporting System. 2. Lumbar Radiculitis: Continuecurrent medication regimen withGabapentin.11/10/2018 3. Intercostal neuralgia: Unable to continue Flector Patch due to cost, Continuecurrent medication regimen withVoltaren Gel,Robaxin and Neurontin.11/10/2018 4. Insomnia: Continuecurrent medication regime withtrazodone.11/10/2018 5. Muscle Spasm: Continuecurrent medication regimen withRobaxin.10/19/2018 6. Polyarthralgia:  Continue current medication regime. Continue to Monitor.11/10/2018 7. Chronic Bilateral Thoracic Pain: Continue HEP and Continue Current medication regimen. Continue to monitor.10/19/2018 8. Chronic Right Hip Pain/ Avascular Necrosis: Awaiting Schedule Appointment with OrthopedistDr. Dion Saucier.[name] 11/10/2018  Telephone Call  Location of patient: In his Home Location of provider: Office Established patient Time spent on call: 11 Minutes

## 2018-11-16 ENCOUNTER — Ambulatory Visit: Payer: Self-pay | Admitting: Registered Nurse

## 2018-12-16 ENCOUNTER — Encounter: Payer: Medicare HMO | Attending: Physical Medicine & Rehabilitation | Admitting: Registered Nurse

## 2018-12-16 ENCOUNTER — Other Ambulatory Visit: Payer: Self-pay

## 2018-12-16 ENCOUNTER — Encounter: Payer: Self-pay | Admitting: Registered Nurse

## 2018-12-16 VITALS — Ht 69.0 in | Wt 242.0 lb

## 2018-12-16 DIAGNOSIS — M62838 Other muscle spasm: Secondary | ICD-10-CM

## 2018-12-16 DIAGNOSIS — M25551 Pain in right hip: Secondary | ICD-10-CM | POA: Diagnosis not present

## 2018-12-16 DIAGNOSIS — G8929 Other chronic pain: Secondary | ICD-10-CM

## 2018-12-16 DIAGNOSIS — M546 Pain in thoracic spine: Secondary | ICD-10-CM | POA: Diagnosis not present

## 2018-12-16 DIAGNOSIS — M519 Unspecified thoracic, thoracolumbar and lumbosacral intervertebral disc disorder: Secondary | ICD-10-CM

## 2018-12-16 DIAGNOSIS — Z79891 Long term (current) use of opiate analgesic: Secondary | ICD-10-CM

## 2018-12-16 DIAGNOSIS — G8921 Chronic pain due to trauma: Secondary | ICD-10-CM

## 2018-12-16 DIAGNOSIS — M5416 Radiculopathy, lumbar region: Secondary | ICD-10-CM | POA: Diagnosis not present

## 2018-12-16 DIAGNOSIS — M4647 Discitis, unspecified, lumbosacral region: Secondary | ICD-10-CM | POA: Insufficient documentation

## 2018-12-16 DIAGNOSIS — M8700 Idiopathic aseptic necrosis of unspecified bone: Secondary | ICD-10-CM

## 2018-12-16 DIAGNOSIS — F17210 Nicotine dependence, cigarettes, uncomplicated: Secondary | ICD-10-CM | POA: Insufficient documentation

## 2018-12-16 DIAGNOSIS — M25512 Pain in left shoulder: Secondary | ICD-10-CM

## 2018-12-16 DIAGNOSIS — Z5181 Encounter for therapeutic drug level monitoring: Secondary | ICD-10-CM

## 2018-12-16 DIAGNOSIS — G894 Chronic pain syndrome: Secondary | ICD-10-CM

## 2018-12-16 DIAGNOSIS — M2550 Pain in unspecified joint: Secondary | ICD-10-CM

## 2018-12-16 MED ORDER — HYDROCODONE-ACETAMINOPHEN 10-325 MG PO TABS: 1.0000 | ORAL_TABLET | Freq: Four times a day (QID) | ORAL | 0 refills | Status: DC | PRN

## 2018-12-16 NOTE — Progress Notes (Signed)
Subjective:    Patient ID: Matthew Sloan, male    DOB: Nov 24, 1960, 58 y.o.   MRN: [id number]  HPI: Matthew Sloan is a 58 y.o. male his appointment was changed, due to national recommendations of social distancing due to COVID 19, an audio/video telehealth visit is felt to be most appropriate for this patient at this time.  See Chart message from today for the patient's consent [facility].     He states his pain is located in his left shoulder, mid- lower back and right hip pain. Also reports generalized joint pain all over. He rates his pain 9. His current exercise regime is walking and peerforming stretching exercises.   Matthew Sloan Morphine equivalent is 40.00 MME.  Last UDS was Performed on 01/21/2018, it was consistent.   Matthew Sloan asked the Health and History Questions. This provider and Angelica ChessmanKeesha Lee  verified we were  speaking with the correct person using two identifiers.   Pain Inventory Average Pain 9 Pain Right Now 9 My pain is constant and aching  In the last 24 hours, has pain interfered with the following? General activity 9 Relation with others 9 Enjoyment of life 9 What TIME of day is your pain at its worst? varies Sleep (in general) Fair  Pain is worse with: walking, standing and some activites Pain improves with: rest, pacing activities and medication Relief from Meds: 3  Mobility walk with assistance use a cane how many minutes can you walk? 0 ability to climb steps?  no do you drive?  yes  Function disabled: date disabled na I need assistance with the following:  household duties  Neuro/Psych weakness numbness trouble walking  Prior Studies Any changes since last visit?  no  Physicians involved in your care Any changes since last visit?  no   Family History  Problem Relation Age of Onset  . Cancer Mother [id number]       breast cancer  . Heart disease Father [id number]       CHF   Social  History   Socioeconomic History  . Marital status: Single    Spouse name: Not on file  . Number of children: Not on file  . Years of education: Not on file  . Highest education level: Not on file  Occupational History  . Not on file  Social Needs  . Financial resource strain: Not on file  . Food insecurity:    Worry: Not on file    Inability: Not on file  . Transportation needs:    Medical: Not on file    Non-medical: Not on file  Tobacco Use  . Smoking status: Current Every Day Smoker    Packs/day: 1.00    Years: 30.00    Pack years: 30.00    Types: Cigarettes  . Smokeless tobacco: Never Used  . Tobacco comment: working on it-down to 0.5 ppd  Substance and Sexual Activity  . Alcohol use: No  . Drug use: No  . Sexual activity: Not on file  Lifestyle  . Physical activity:    Days per week: Not on file    Minutes per session: Not on file  . Stress: Not on file  Relationships  . Social connections:    Talks on phone: Not on file    Gets together: Not on file    Attends religious service: Not on file    Active member of club or organization: Not on  file    Attends meetings of clubs or organizations: Not on file    Relationship status: Not on file  Other Topics Concern  . Not on file  Social History Narrative  . Not on file   Past Surgical History:  Procedure Laterality Date  . bilateral tubes placed in ears  04/12/2012  . CARPAL TUNNEL RELEASE  12/15/2011   Procedure: CARPAL TUNNEL RELEASE;  Surgeon: [name], MD;  Location: [facility];  Service: Orthopedics;  Laterality: Left;  [name] MULTIPLE TOOTH EXTRACTIONS    . SHOULDER SURGERY  2012   lt  . ULNAR TUNNEL RELEASE  12/15/2011   Procedure: CUBITAL TUNNEL RELEASE;  Surgeon: [name], MD;  Location: [facility];  Service: Orthopedics;  Laterality: Left;  left cubital tunnel release   Past Medical History:  Diagnosis Date  . Allergy   . Chronic pain due to trauma   . Disturbance  of skin sensation   . Intercostal neuralgia   . Lumbosacral neuritis   . Myocardial infarction (HCC)   . Rotator cuff (capsule) sprain   . Snores    There were no vitals taken for this visit.  Opioid Risk Score:   Fall Risk Score:  `1  Depression screen PHQ 2/9  Depression screen [facility] 2/9 12/16/2018 11/10/2018 10/19/2018 04/18/2018 12/23/2017 11/19/2017 08/23/2017  Decreased Interest 0 0 0 0 0 0 0  Down, Depressed, Hopeless 0 0 0 0 0 0 0  PHQ - 2 Score 0 0 0 0 0 0 0  Altered sleeping - - - - - - -  Tired, decreased energy - - - - - - -  Feeling bad or failure about yourself  - - - - - - -  Trouble concentrating - - - - - - -  Moving slowly or fidgety/restless - - - - - - -  Suicidal thoughts - - - - - - -  PHQ-9 Score - - - - - - -     Review of Systems  Constitutional: Negative.   HENT: Negative.   Eyes: Negative.   Respiratory: Negative.   Cardiovascular: Negative.   Gastrointestinal: Negative.   Endocrine: Negative.   Genitourinary: Negative.   Musculoskeletal: Positive for gait problem.  Skin: Negative.   Allergic/Immunologic: Negative.   Neurological: Positive for weakness and numbness.  Hematological: Negative.   Psychiatric/Behavioral: Negative.   All other systems reviewed and are negative.      Objective:   Physical Exam Vitals signs and nursing note reviewed.  Musculoskeletal:     Comments: No Physical Exam Performed: Virtual Visit  Neurological:     Mental Status: He is oriented to person, place, and time.           Assessment & Plan:  1. 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 Refilled:Hydrocodone 10/325 mg #120 pills--use every 6 hours as needed. 12/16/2018. We will continue the opioid monitoring program, this consists of regular clinic visits, examinations, urine drug screen, pill counts as well as use of [place]  Controlled Substance reporting System. 2. Lumbar Radiculitis: Continuecurrent medication regimen withGabapentin.12/16/2018 3. Intercostal neuralgia: Unable to continue Flector Patch due to cost, Continuecurrent medication regimen withVoltaren Gel,Robaxin and Neurontin.12/16/2018 4. Insomnia: Continuecurrent medication regime withtrazodone.11/10/2018 5. Muscle Spasm: Continuecurrent medication regimen withRobaxin.12/16/2018 6. Polyarthralgia: Continue current medication regime. Continue to Monitor.12/16/2018 7. Chronic Bilateral Thoracic Pain: Continue HEP and Continue  Current medication regimen. Continue to monitor.12/16/2018 8. Chronic Right Hip Pain/ Avascular Necrosis: Awaiting Schedule Appointment with OrthopedistDr. Dion Saucier.[name] 12/16/2018  Telephone Call  Location of patient: In his Home Location of provider: Office Established patient Time spent on call: 10 minutes

## 2018-12-29 IMAGING — DX DG RIBS W/ CHEST 3+V*L*
5 series · 5 of 5 positions shown · non-contrast
Comparison: Chest radiograph 03/04/2010

CLINICAL DATA: New onset of anterior lower rib pain. Known prior
rib fractures.

EXAM:
LEFT RIBS AND CHEST - 3+ VIEW

[rib pa (1 of 2)]
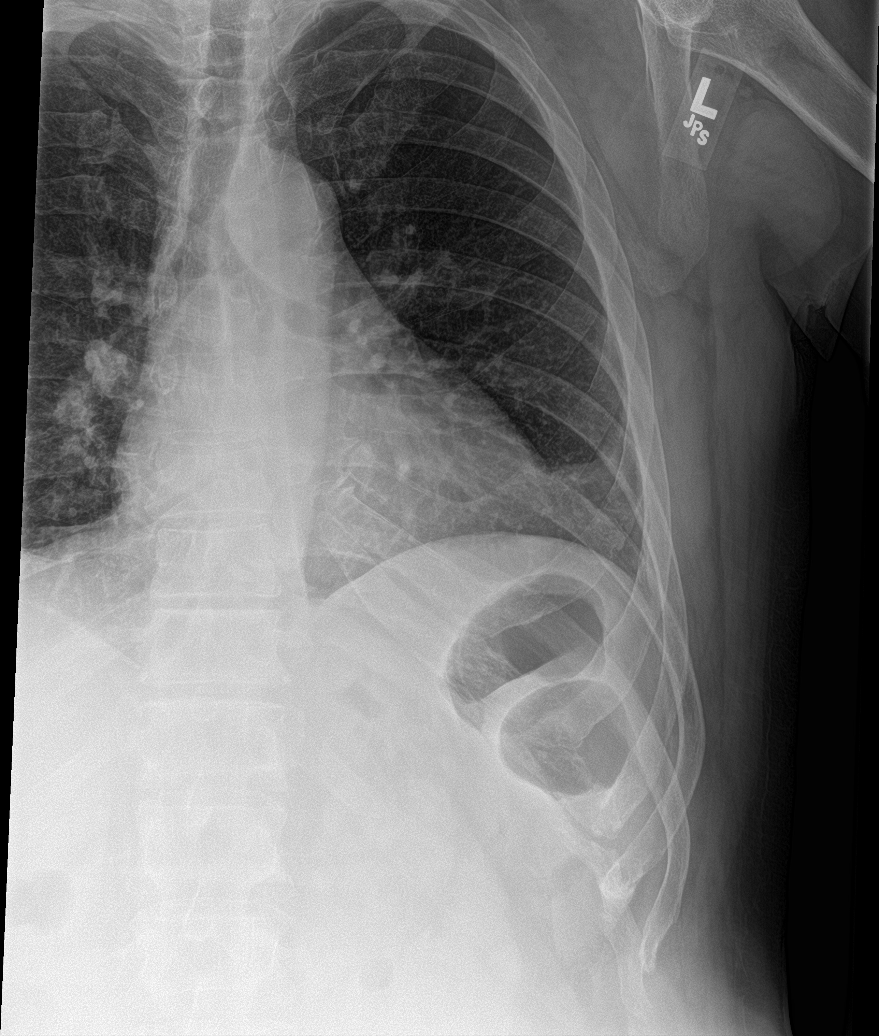

[rib pa obl (1 of 2)]
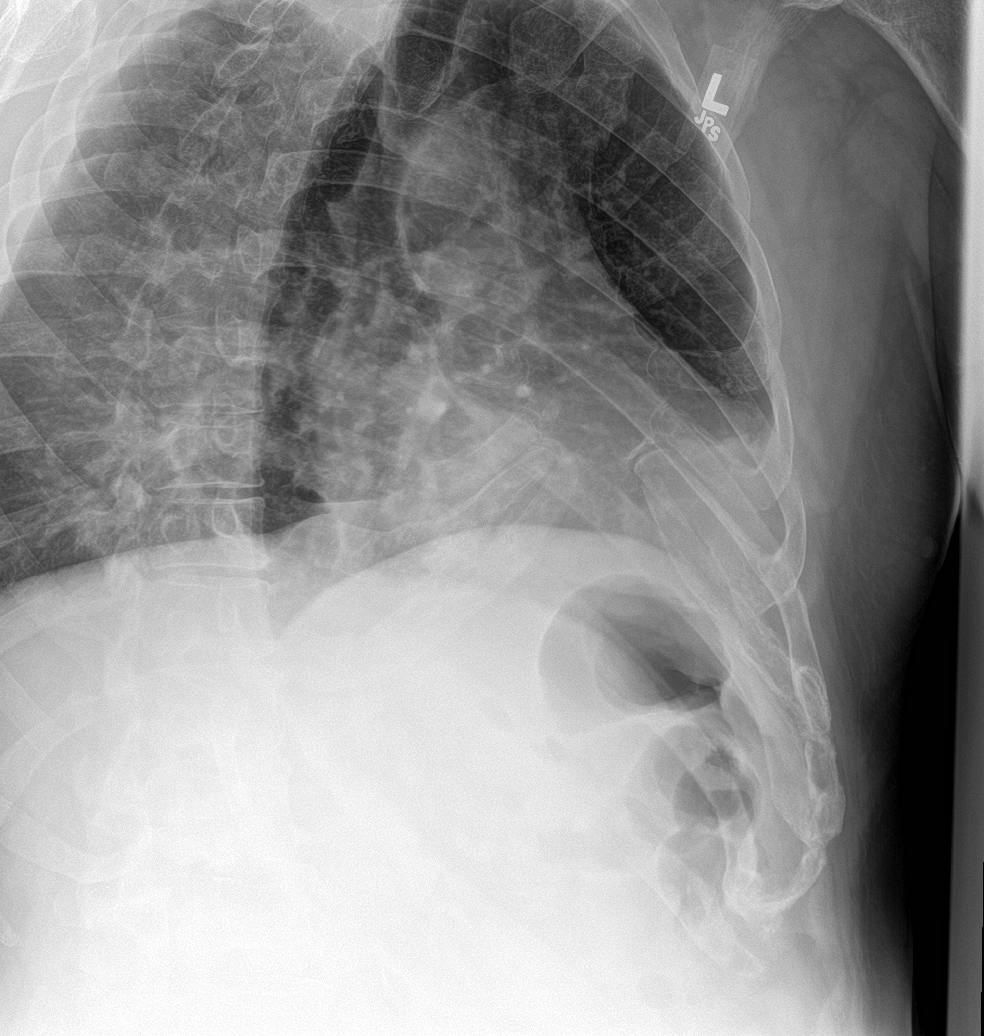

[chest pa]
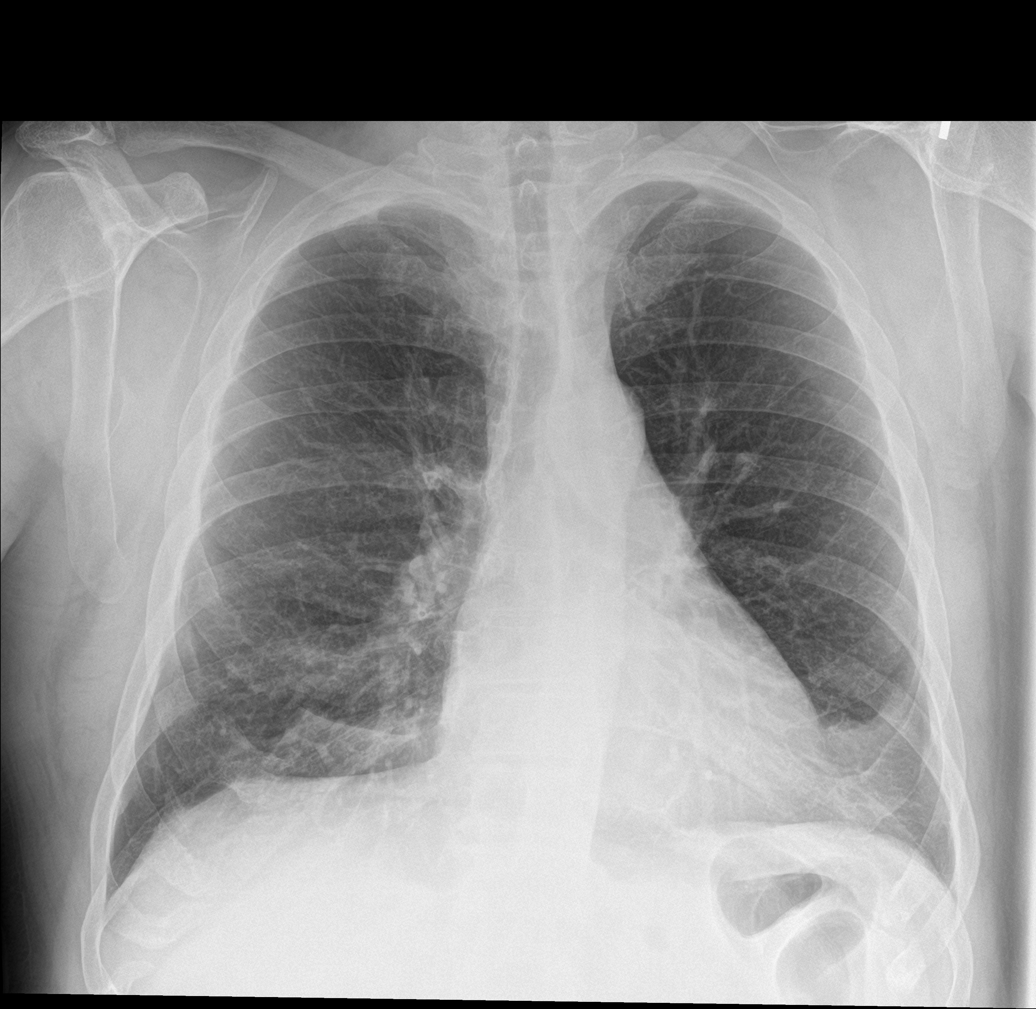

[rib pa obl (2 of 2)]
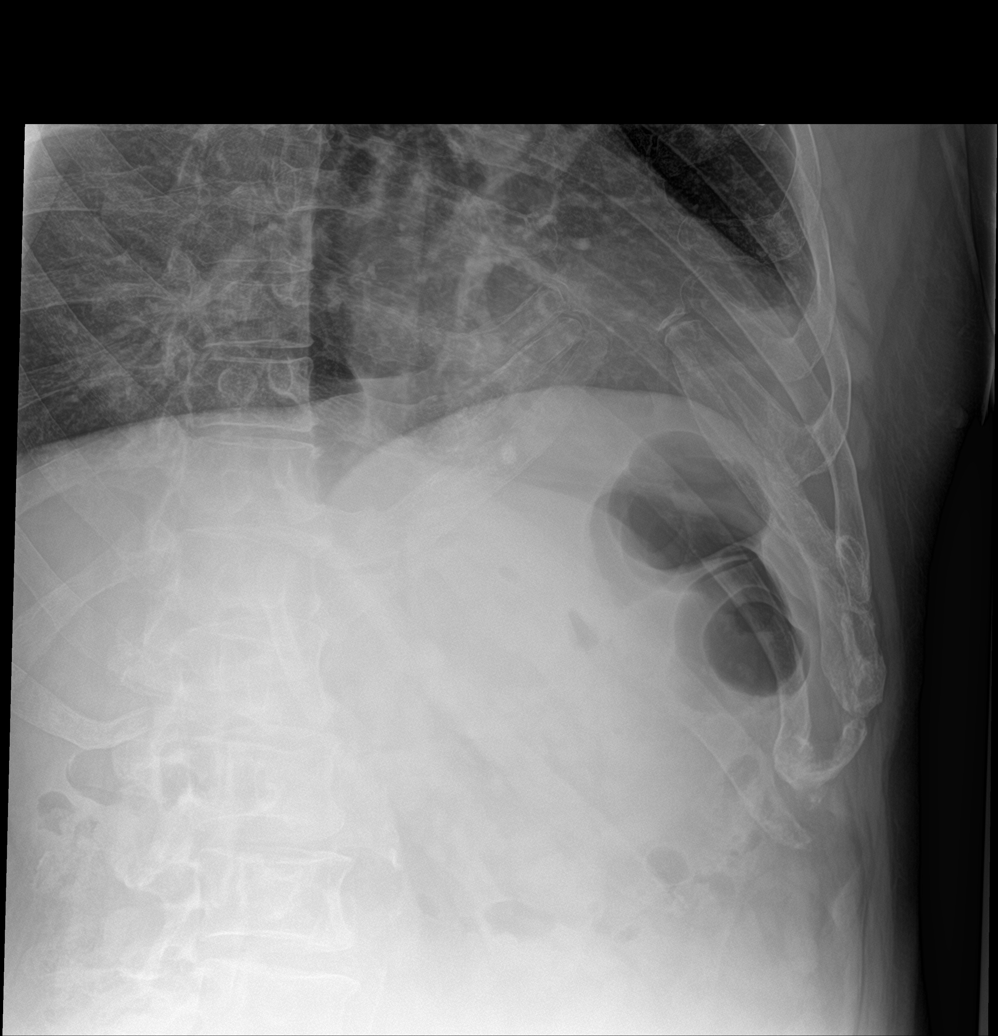

[rib pa (2 of 2)]
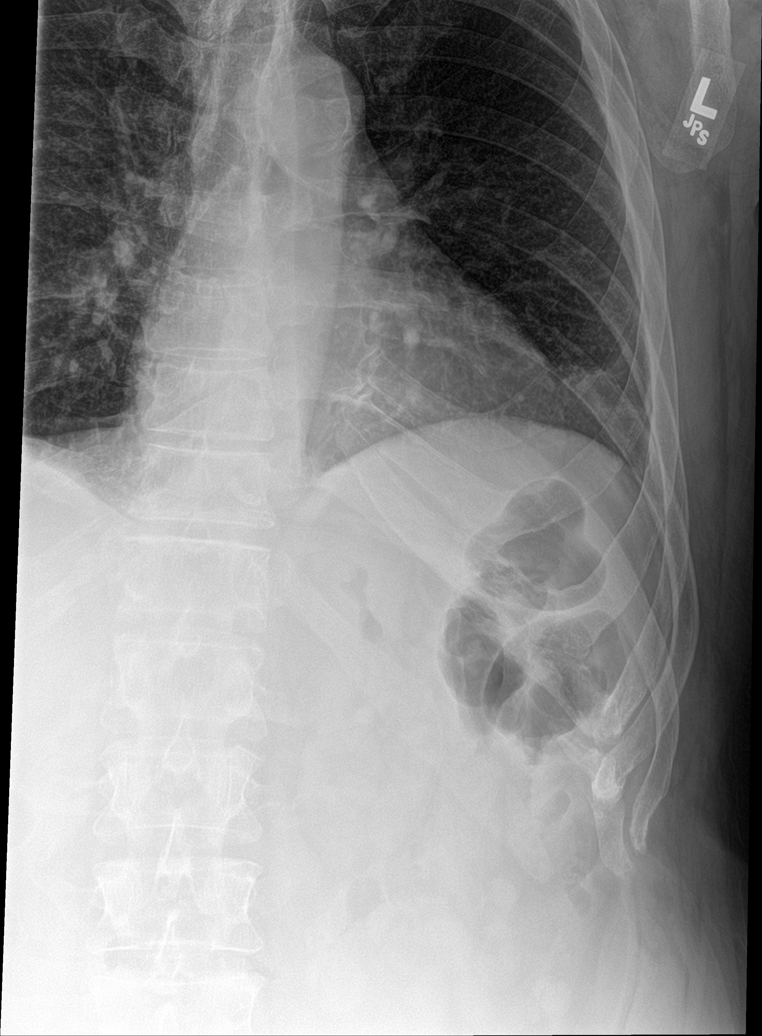

[5 of 5 positions shown; findings below may reference images not displayed]

FINDINGS: No fracture or other bone lesions are seen involving the ribs. There
is no evidence of pneumothorax or pleural effusion. Both lungs are
clear. Heart size and mediastinal contours are within normal limits.
IMPRESSION: Negative.

## 2019-01-16 ENCOUNTER — Encounter: Payer: Medicare HMO | Admitting: Registered Nurse

## 2019-01-16 ENCOUNTER — Other Ambulatory Visit: Payer: Self-pay

## 2019-01-16 ENCOUNTER — Encounter: Payer: Self-pay | Admitting: Registered Nurse

## 2019-01-16 ENCOUNTER — Encounter: Payer: Medicare HMO | Attending: Physical Medicine & Rehabilitation | Admitting: Registered Nurse

## 2019-01-16 VITALS — Temp 102.0°F | Ht 69.0 in | Wt 248.0 lb

## 2019-01-16 DIAGNOSIS — M546 Pain in thoracic spine: Secondary | ICD-10-CM | POA: Diagnosis not present

## 2019-01-16 DIAGNOSIS — M25551 Pain in right hip: Secondary | ICD-10-CM | POA: Insufficient documentation

## 2019-01-16 DIAGNOSIS — M8700 Idiopathic aseptic necrosis of unspecified bone: Secondary | ICD-10-CM

## 2019-01-16 DIAGNOSIS — Z79891 Long term (current) use of opiate analgesic: Secondary | ICD-10-CM

## 2019-01-16 DIAGNOSIS — G8921 Chronic pain due to trauma: Secondary | ICD-10-CM

## 2019-01-16 DIAGNOSIS — M4647 Discitis, unspecified, lumbosacral region: Secondary | ICD-10-CM | POA: Insufficient documentation

## 2019-01-16 DIAGNOSIS — M519 Unspecified thoracic, thoracolumbar and lumbosacral intervertebral disc disorder: Secondary | ICD-10-CM | POA: Diagnosis not present

## 2019-01-16 DIAGNOSIS — G8929 Other chronic pain: Secondary | ICD-10-CM | POA: Insufficient documentation

## 2019-01-16 DIAGNOSIS — M2550 Pain in unspecified joint: Secondary | ICD-10-CM

## 2019-01-16 DIAGNOSIS — G894 Chronic pain syndrome: Secondary | ICD-10-CM

## 2019-01-16 DIAGNOSIS — F17210 Nicotine dependence, cigarettes, uncomplicated: Secondary | ICD-10-CM | POA: Insufficient documentation

## 2019-01-16 DIAGNOSIS — M5416 Radiculopathy, lumbar region: Secondary | ICD-10-CM | POA: Diagnosis not present

## 2019-01-16 DIAGNOSIS — Z5181 Encounter for therapeutic drug level monitoring: Secondary | ICD-10-CM

## 2019-01-16 MED ORDER — HYDROCODONE-ACETAMINOPHEN 10-325 MG PO TABS: 1.0000 | ORAL_TABLET | Freq: Four times a day (QID) | ORAL | 0 refills | Status: DC | PRN

## 2019-01-16 NOTE — Progress Notes (Addendum)
Subjective:    Patient ID: Matthew Sloan, male    DOB: Nov 18, 1960, 58 y.o.   MRN: [id number]  HPI: Matthew Sloan is a 57 y.o. male his appointment change, Matthew Sloan had a fever for the last two days and was scheduled to have COVID-19 swab today. We changed his appointment to a tele-health visit. Matthew Sloan consented to Tele-health visit.  Matthew Sloan states he has pain in his mid- lower back radiating into his right hip and right lower extremity. Also reports generalized joint pain all over. He rates his pain 9. His current exercise regime is walking and performing his stretching exercises.   Matthew Sloan Morphine equivalent is 40.00 MME.  Last UDS was Performed on 01/21/2018, it was consistent.   [name] CMA asked the Health and History Questions. This provider and [name]  verified we were  speaking with the correct person using two identifiers.   Pain Inventory Average Pain 8 Pain Right Now 9 My pain is sharp, stabbing and aching  In the last 24 hours, has pain interfered with the following? General activity 9 Relation with others 8 Enjoyment of life 8 What TIME of day is your pain at its worst? all the time Sleep (in general) Poor  Pain is worse with: walking, bending, sitting, inactivity, standing and some activites Pain improves with: medication and injections Relief from Meds: 5  Mobility use a cane use a Sensing ability to climb steps?  no do you drive?  yes  Function disabled: date disabled 2011  Neuro/Psych weakness trouble walking dizziness  Prior Studies xray  Physicians involved in your care Primary care n/a   Family History  Problem Relation Age of Onset  . Cancer Mother 55       breast cancer  . Heart disease Father 4       CHF   Social History   Socioeconomic History  . Marital status: Single    Spouse name: Not on file  . Number of children: Not on file  . Years of education: Not on file  . Highest education level: Not on file   Occupational History  . Not on file  Social Needs  . Financial resource strain: Not on file  . Food insecurity    Worry: Not on file    Inability: Not on file  . Transportation needs    Medical: Not on file    Non-medical: Not on file  Tobacco Use  . Smoking status: Current Every Day Smoker    Packs/day: 1.00    Years: 30.00    Pack years: 30.00    Types: Cigarettes  . Smokeless tobacco: Never Used  . Tobacco comment: working on it-down to 0.5 ppd  Substance and Sexual Activity  . Alcohol use: No  . Drug use: No  . Sexual activity: Not on file  Lifestyle  . Physical activity    Days per week: Not on file    Minutes per session: Not on file  . Stress: Not on file  Relationships  . Social Herbalist on phone: Not on file    Gets together: Not on file    Attends religious service: Not on file    Active member of club or organization: Not on file    Attends meetings of clubs or organizations: Not on file    Relationship status: Not on file  Other Topics Concern  . Not on file  Social History Narrative  .  Not on file   Past Surgical History:  Procedure Laterality Date  . bilateral tubes placed in ears  04/12/2012  . CARPAL TUNNEL RELEASE  12/15/2011   Procedure: CARPAL TUNNEL RELEASE;  Surgeon: Tami RibasKevin R Kuzma, MD;  Location: [facility];  Service: Orthopedics;  Laterality: Left;  [name]. MULTIPLE TOOTH EXTRACTIONS    . SHOULDER SURGERY  2012   lt  . ULNAR TUNNEL RELEASE  12/15/2011   Procedure: CUBITAL TUNNEL RELEASE;  Surgeon: Tami RibasKevin R Kuzma, MD;  Location: [facility];  Service: Orthopedics;  Laterality: Left;  left cubital tunnel release   Past Medical History:  Diagnosis Date  . Allergy   . Chronic pain due to trauma   . Disturbance of skin sensation   . Intercostal neuralgia   . Lumbosacral neuritis   . Myocardial infarction (HCC)   . Rotator cuff (capsule) sprain   . Snores    There were no vitals taken for this visit.  Opioid  Risk Score:   Fall Risk Score:  `1  Depression screen PHQ 2/9  [facility] 12/16/2018 11/10/2018 10/19/2018 04/18/2018 12/23/2017 11/19/2017 08/23/2017  Decreased Interest 0 0 0 0 0 0 0  Down, Depressed, Hopeless 0 0 0 0 0 0 0  PHQ - 2 Score 0 0 0 0 0 0 0  Altered sleeping - - - - - - -  Tired, decreased energy - - - - - - -  Feeling bad or failure about yourself  - - - - - - -  Trouble concentrating - - - - - - -  Moving slowly or fidgety/restless - - - - - - -  Suicidal thoughts - - - - - - -  PHQ-9 Score - - - - - - -      Review of Systems  Constitutional: Positive for appetite change, chills, fever and unexpected weight change.  Respiratory: Positive for cough, shortness of breath and wheezing.   All other systems reviewed and are negative.      Objective:   Physical Exam Vitals signs and nursing note reviewed.  Musculoskeletal:     Comments: No Physical Exam Performed: Virtual Visit  Neurological:     Mental Status: He is oriented to person, place, and time.           Assessment & Plan:  1. 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 01/16/2019. We will continue the opioid monitoring program, this consists of regular clinic visits, examinations, urine drug screen, pill counts as well as use of [place] Cont[place]. 2. Lumbar Radiculitis: Continuecurrent medication regimen withGabapentin.01/16/2019 3. Intercostal neuralgia: Unable to continue Flector Patch due to cost, Continuecurrent medication regimen withVoltaren Gel,Robaxin and Neurontin.01/16/2019 4. Insomnia: Continuecurrent medication regime withtrazodone.01/16/2019 5. Muscle Spasm: Continuecurrent medication regimen withRobaxin.01/16/2019 6.  Polyarthralgia: Continue current medication regime. Continue to Monitor.01/16/2019 7. Chronic Bilateral Thoracic Pain: Continue HEP and Continue Current medication regimen. Continue to monitor.01/16/2019 8. Chronic Right Hip Pain/ Avascular Necrosis: S/P Appointment with OrthopedistDr. Dion SaucierLandau/ S/P Hip Injection by Dr. Maurice SmallIbazebo  he reports.  01/16/2019  Telephone Call Location of patient:In his Home Location of provider: Office Established patient Time spent on call: 10 minutes

## 2019-02-16 ENCOUNTER — Other Ambulatory Visit: Payer: Self-pay

## 2019-02-16 ENCOUNTER — Encounter: Payer: Medicare HMO | Attending: Physical Medicine & Rehabilitation | Admitting: Registered Nurse

## 2019-02-16 ENCOUNTER — Encounter: Payer: Self-pay | Admitting: Registered Nurse

## 2019-02-16 DIAGNOSIS — M2550 Pain in unspecified joint: Secondary | ICD-10-CM

## 2019-02-16 DIAGNOSIS — M25551 Pain in right hip: Secondary | ICD-10-CM | POA: Diagnosis not present

## 2019-02-16 DIAGNOSIS — G8921 Chronic pain due to trauma: Secondary | ICD-10-CM

## 2019-02-16 DIAGNOSIS — M62838 Other muscle spasm: Secondary | ICD-10-CM

## 2019-02-16 DIAGNOSIS — M5416 Radiculopathy, lumbar region: Secondary | ICD-10-CM | POA: Diagnosis not present

## 2019-02-16 DIAGNOSIS — M519 Unspecified thoracic, thoracolumbar and lumbosacral intervertebral disc disorder: Secondary | ICD-10-CM | POA: Diagnosis not present

## 2019-02-16 DIAGNOSIS — Z5181 Encounter for therapeutic drug level monitoring: Secondary | ICD-10-CM

## 2019-02-16 DIAGNOSIS — M4647 Discitis, unspecified, lumbosacral region: Secondary | ICD-10-CM | POA: Insufficient documentation

## 2019-02-16 DIAGNOSIS — G894 Chronic pain syndrome: Secondary | ICD-10-CM

## 2019-02-16 DIAGNOSIS — G8929 Other chronic pain: Secondary | ICD-10-CM | POA: Insufficient documentation

## 2019-02-16 DIAGNOSIS — F17210 Nicotine dependence, cigarettes, uncomplicated: Secondary | ICD-10-CM | POA: Insufficient documentation

## 2019-02-16 DIAGNOSIS — M546 Pain in thoracic spine: Secondary | ICD-10-CM | POA: Diagnosis not present

## 2019-02-16 MED ORDER — HYDROCODONE-ACETAMINOPHEN 10-325 MG PO TABS: 1.0000 | ORAL_TABLET | Freq: Four times a day (QID) | ORAL | 0 refills | Status: DC | PRN

## 2019-02-16 NOTE — Progress Notes (Signed)
Subjective:    Patient ID: Matthew Sloan, male    DOB: 12-10-1960, 58 y.o.   MRN: [id number]  HPI: Matthew JubaJohn L Duerson is a 58 y.o. male whose appointment  was changed, [name] awaiting COVID-19 test results. His appointment changed to a tele-health visit, he consented to visit. He states his pain is located in his mid- lower back radiating into his right hip and right lower extremity. Also reports generalize pain all over. He rates his pain 8. His current exercise regime is walking and performing stretching exercises with bands and ball therapy.   [name] states he had a hip injection by Dr. Maurice SmallIbazebo  4 weeks ago with some relief noted.  .  [name] Morphine equivalent is 40.00  MME. Last UDS was Performed on 01/21/2018, it was consistent.     Yolande Jollyourtney Lawson CMA asked the health and history questions. This provider and Yolande JollyCourtney Lawson verifies we were speaking with the correct person using two identifiers.   Pain Inventory Average Pain 8 Pain Right Now 8 My pain is constant, sharp, dull and stabbing  In the last 24 hours, has pain interfered with the following? General activity 8 Relation with others 8 Enjoyment of life 8 What TIME of day is your pain at its worst? all Sleep (in general) Fair  Pain is worse with: walking, bending, sitting, inactivity, standing, unsure and some activites Pain improves with: rest, heat/ice, therapy/exercise, medication and injections Relief from Meds: 3  Mobility walk with assistance use a cane how many minutes can you walk? 10 ability to climb steps?  no do you drive?  yes  Function disabled: date disabled .  Neuro/Psych No problems in this area  Prior Studies Any changes since last visit?  no  Physicians involved in your care Any changes since last visit?  no   Family History  Problem Relation Age of Onset  . Cancer Mother [id number]       breast cancer  . Heart disease Father [id number]       CHF   Social History   Socioeconomic  History  . Marital status: Single    Spouse name: Not on file  . Number of children: Not on file  . Years of education: Not on file  . Highest education level: Not on file  Occupational History  . Not on file  Social Needs  . Financial resource strain: Not on file  . Food insecurity    Worry: Not on file    Inability: Not on file  . Transportation needs    Medical: Not on file    Non-medical: Not on file  Tobacco Use  . Smoking status: Current Every Day Smoker    Packs/day: 1.00    Years: 30.00    Pack years: 30.00    Types: Cigarettes  . Smokeless tobacco: Never Used  . Tobacco comment: working on it-down to 0.5 ppd  Substance and Sexual Activity  . Alcohol use: No  . Drug use: No  . Sexual activity: Not on file  Lifestyle  . Physical activity    Days per week: Not on file    Minutes per session: Not on file  . Stress: Not on file  Relationships  . Social Musicianconnections    Talks on phone: Not on file    Gets together: Not on file    Attends religious service: Not on file    Active member of club or organization: Not on file  Attends meetings of clubs or organizations: Not on file    Relationship status: Not on file  Other Topics Concern  . Not on file  Social History Narrative  . Not on file   Past Surgical History:  Procedure Laterality Date  . bilateral tubes placed in ears  04/12/2012  . CARPAL TUNNEL RELEASE  12/15/2011   Procedure: CARPAL TUNNEL RELEASE;  Surgeon: Tami RibasKevin R Kuzma, MD;  Location: [facility];  Service: Orthopedics;  Laterality: Left;  [name]. MULTIPLE TOOTH EXTRACTIONS    . SHOULDER SURGERY  2012   lt  . ULNAR TUNNEL RELEASE  12/15/2011   Procedure: CUBITAL TUNNEL RELEASE;  Surgeon: Tami RibasKevin R Kuzma, MD;  Location: [facility];  Service: Orthopedics;  Laterality: Left;  left cubital tunnel release   Past Medical History:  Diagnosis Date  . Allergy   . Chronic pain due to trauma   . Disturbance of skin sensation   .  Intercostal neuralgia   . Lumbosacral neuritis   . Myocardial infarction (HCC)   . Rotator cuff (capsule) sprain   . Snores    There were no vitals taken for this visit.  Opioid Risk Score:   Fall Risk Score:  `1  Depression screen PHQ 2/9  Depression screen [facility] 12/16/2018 11/10/2018 10/19/2018 04/18/2018 12/23/2017 11/19/2017 08/23/2017  Decreased Interest 0 0 0 0 0 0 0  Down, Depressed, Hopeless 0 0 0 0 0 0 0  PHQ - 2 Score 0 0 0 0 0 0 0  Altered sleeping - - - - - - -  Tired, decreased energy - - - - - - -  Feeling bad or failure about yourself  - - - - - - -  Trouble concentrating - - - - - - -  Moving slowly or fidgety/restless - - - - - - -  Suicidal thoughts - - - - - - -  PHQ-9 Score - - - - - - -     Review of Systems  Constitutional: Positive for activity change and unexpected weight change.  HENT: Negative.   Respiratory: Negative.   Cardiovascular: Negative.   Gastrointestinal: Negative.   Genitourinary: Negative.   Musculoskeletal: Positive for arthralgias, back pain, gait problem and myalgias.  Skin: Negative.   Allergic/Immunologic: Negative.   Hematological: Negative.   Psychiatric/Behavioral: Positive for dysphoric mood.  All other systems reviewed and are negative.      Objective:   Physical Exam Vitals signs and nursing note reviewed.  Musculoskeletal:     Comments: No Physical Exam Performed: Virtual Visit  Neurological:     Mental Status: He is alert and oriented to person, place, and time.  Psychiatric:        Mood and Affect: Mood normal.        Behavior: Behavior normal.           Assessment & Plan:  1. 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 02/16/2019. We will continue the opioid monitoring program, this consists of regular clinic  visits, examinations, urine drug screen, pill counts as well as use of [place] Controlled Substance reporting System. 2. Lumbar Radiculitis: Continuecurrent medication regimen withGabapentin.02/16/2019 3. Intercostal neuralgia: Unable to continue Flector Patch due to cost, Continuecurrent medication regimen withVoltaren Gel,Robaxin and Neurontin.02/16/2019 4. Insomnia: Continuecurrent medication regime withtrazodone.02/16/2019 5. Muscle Spasm: Continuecurrent medication  regimen withRobaxin.02/16/2019 6. Polyarthralgia: Continue current medication regime. Continue to Monitor.02/16/2019 7. Chronic Bilateral Thoracic Pain: Continue HEP and Continue Current medication regimen. Continue to monitor.02/16/2019 8. Chronic Right Hip Pain/ Avascular Necrosis: OrthopedistDr. Mardelle Matte Following.  S/P Hip Injection by Dr. Ron Agee.  02/16/2019   Telephone Call Location of patient:In his Home Location of provider: Office Established patient Time spent on call: 10 minutes  F/U in 1 month

## 2019-03-17 ENCOUNTER — Encounter: Payer: Medicare HMO | Admitting: Registered Nurse

## 2019-03-23 ENCOUNTER — Encounter: Payer: Medicare HMO | Attending: Physical Medicine & Rehabilitation | Admitting: Registered Nurse

## 2019-03-23 ENCOUNTER — Encounter: Payer: Self-pay | Admitting: Registered Nurse

## 2019-03-23 ENCOUNTER — Other Ambulatory Visit: Payer: Self-pay

## 2019-03-23 VITALS — Wt 246.0 lb

## 2019-03-23 DIAGNOSIS — M25551 Pain in right hip: Secondary | ICD-10-CM | POA: Insufficient documentation

## 2019-03-23 DIAGNOSIS — G894 Chronic pain syndrome: Secondary | ICD-10-CM

## 2019-03-23 DIAGNOSIS — G8929 Other chronic pain: Secondary | ICD-10-CM | POA: Insufficient documentation

## 2019-03-23 DIAGNOSIS — M519 Unspecified thoracic, thoracolumbar and lumbosacral intervertebral disc disorder: Secondary | ICD-10-CM

## 2019-03-23 DIAGNOSIS — M2550 Pain in unspecified joint: Secondary | ICD-10-CM

## 2019-03-23 DIAGNOSIS — M546 Pain in thoracic spine: Secondary | ICD-10-CM | POA: Diagnosis not present

## 2019-03-23 DIAGNOSIS — F17210 Nicotine dependence, cigarettes, uncomplicated: Secondary | ICD-10-CM | POA: Insufficient documentation

## 2019-03-23 DIAGNOSIS — M5416 Radiculopathy, lumbar region: Secondary | ICD-10-CM | POA: Diagnosis not present

## 2019-03-23 DIAGNOSIS — Z79891 Long term (current) use of opiate analgesic: Secondary | ICD-10-CM

## 2019-03-23 DIAGNOSIS — Z5181 Encounter for therapeutic drug level monitoring: Secondary | ICD-10-CM

## 2019-03-23 DIAGNOSIS — M4647 Discitis, unspecified, lumbosacral region: Secondary | ICD-10-CM | POA: Insufficient documentation

## 2019-03-23 DIAGNOSIS — G8921 Chronic pain due to trauma: Secondary | ICD-10-CM | POA: Insufficient documentation

## 2019-03-23 DIAGNOSIS — M62838 Other muscle spasm: Secondary | ICD-10-CM

## 2019-03-23 MED ORDER — HYDROCODONE-ACETAMINOPHEN 10-325 MG PO TABS: 1.0000 | ORAL_TABLET | Freq: Four times a day (QID) | ORAL | 0 refills | Status: DC | PRN

## 2019-03-23 NOTE — Progress Notes (Signed)
Subjective:    Patient ID: Matthew Sloan, male    DOB: July 10, 1961, 58 y.o.   MRN: [id number]  HPI: Matthew Sloan is a 58 y.o. male whose appointment was changed to a tele-health visit. He was exposed to a choir member on Sunday 03/19/2019, who have been diagnosed with COVID-19. [name] consents to tele-health visit.  He states his pain is located in his left sholder, mid- lower back radiating into his bilateral lower extremities and right hip. Also reports generalized joint pain all over.He rates his  Pain 8. His current exercise regime is walking and performing stretching exercises.  [name] Morphine equivalent is 40.00 MME.    Benson Setting RMA asked the Health and History Questions. This provider and Benson Setting  verified we were  speaking with the correct person using two identifiers.  Pain Inventory Average Pain 7-8 Pain Right Now 8 My pain is constant, sharp, dull and stabbing  In the last 24 hours, has pain interfered with the following? General activity 3 Relation with others 3 Enjoyment of life 8-9 What TIME of day is your pain at its worst? all day Sleep (in general) Poor  Pain is worse with: bending, inactivity and standing Pain improves with: therapy/exercise and medication Relief from Meds: 2  Mobility use a cane how many minutes can you walk? 20-30 do you drive?  yes  Function I need assistance with the following:  meal prep and household duties  Neuro/Psych No problems in this area  Prior Studies Any changes since last visit?  no  Physicians involved in your care Any changes since last visit?  no   Family History  Problem Relation Age of Onset  . Cancer Mother 4       breast cancer  . Heart disease Father 76       CHF   Social History   Socioeconomic History  . Marital status: Single    Spouse name: Not on file  . Number of children: Not on file  . Years of education: Not on file  . Highest education level: Not on file   Occupational History  . Not on file  Social Needs  . Financial resource strain: Not on file  . Food insecurity    Worry: Not on file    Inability: Not on file  . Transportation needs    Medical: Not on file    Non-medical: Not on file  Tobacco Use  . Smoking status: Current Every Day Smoker    Packs/day: 1.00    Years: 30.00    Pack years: 30.00    Types: Cigarettes  . Smokeless tobacco: Never Used  . Tobacco comment: working on it-down to 0.5 ppd  Substance and Sexual Activity  . Alcohol use: No  . Drug use: No  . Sexual activity: Not on file  Lifestyle  . Physical activity    Days per week: Not on file    Minutes per session: Not on file  . Stress: Not on file  Relationships  . Social Herbalist on phone: Not on file    Gets together: Not on file    Attends religious service: Not on file    Active member of club or organization: Not on file    Attends meetings of clubs or organizations: Not on file    Relationship status: Not on file  Other Topics Concern  . Not on file  Social History Narrative  . Not on  file   Past Surgical History:  Procedure Laterality Date  . bilateral tubes placed in ears  04/12/2012  . CARPAL TUNNEL RELEASE  12/15/2011   Procedure: CARPAL TUNNEL RELEASE;  Surgeon: Tami RibasKevin R Kuzma, MD;  Location: [facility];  Service: Orthopedics;  Laterality: Left;  [name]. MULTIPLE TOOTH EXTRACTIONS    . SHOULDER SURGERY  2012   lt  . ULNAR TUNNEL RELEASE  12/15/2011   Procedure: CUBITAL TUNNEL RELEASE;  Surgeon: Tami RibasKevin R Kuzma, MD;  Location: [facility];  Service: Orthopedics;  Laterality: Left;  left cubital tunnel release   Past Medical History:  Diagnosis Date  . Allergy   . Chronic pain due to trauma   . Disturbance of skin sensation   . Intercostal neuralgia   . Lumbosacral neuritis   . Myocardial infarction (HCC)   . Rotator cuff (capsule) sprain   . Snores    Wt 246 lb (111.6 kg)   BMI 36.33 kg/m   Opioid  Risk Score:   Fall Risk Score:  `1  Depression screen PHQ 2/9  [facility] 12/16/2018 11/10/2018 10/19/2018 04/18/2018 12/23/2017 11/19/2017 08/23/2017  Decreased Interest 0 0 0 0 0 0 0  Down, Depressed, Hopeless 0 0 0 0 0 0 0  PHQ - 2 Score 0 0 0 0 0 0 0  Altered sleeping - - - - - - -  Tired, decreased energy - - - - - - -  Feeling bad or failure about yourself  - - - - - - -  Trouble concentrating - - - - - - -  Moving slowly or fidgety/restless - - - - - - -  Suicidal thoughts - - - - - - -  PHQ-9 Score - - - - - - -    Review of Systems  Constitutional: Negative.   HENT: Negative.   Eyes: Negative.   Respiratory: Positive for cough and wheezing.   Cardiovascular: Negative.   Gastrointestinal: Negative.   Endocrine: Negative.   Genitourinary: Negative.   Musculoskeletal: Negative.   Skin: Negative.   Allergic/Immunologic: Negative.   Neurological: Negative.   Hematological: Negative.   Psychiatric/Behavioral: Negative.   All other systems reviewed and are negative.      Objective:   Physical Exam Vitals signs and nursing note reviewed.  Musculoskeletal:     Comments: No Physical Exam: Virtual Exisit           Assessment & Plan:  1. 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 03/23/2019. We will continue the opioid monitoring program, this consists of regular clinic visits, examinations, urine drug screen, pill counts as well as use of [place] Cont[place] Substance [place]. 2. Lumbar Radiculitis: Continuecurrent medication regimen withGabapentin.03/23/2019 3. Intercostal neuralgia: Unable to continue Flector Patch due to cost, Continuecurrent medication regimen withVoltaren Gel,Robaxin and Neurontin.03/23/2019 4. Insomnia:  Continuecurrent medication regime withtrazodone.03/23/2019 5. Muscle Spasm: Continuecurrent medication regimen withRobaxin.03/23/2019 6. Polyarthralgia: Continue current medication regime. Continue to Monitor.03/23/2019 7. Chronic Bilateral Thoracic Pain: Continue HEP and Continue Current medication regimen. Continue to monitor.03/23/2019 8. Chronic Right Hip Pain/ Avascular Necrosis: S/P Appointment with OrthopedistDr. Dion SaucierLandau/ S/P Hip Injection by Dr. Maurice SmallIbazebo  he reports.  03/23/2019  Telephone Call Location of patient:In his Home Location of provider: Office Established patient Time spent on call: 10 minutes

## 2019-04-05 LAB — TSH: TSH: 0.01 — AB (ref 0.41–5.90)

## 2019-04-12 ENCOUNTER — Encounter: Payer: Self-pay | Admitting: Registered Nurse

## 2019-04-12 ENCOUNTER — Other Ambulatory Visit: Payer: Self-pay

## 2019-04-12 ENCOUNTER — Encounter: Payer: Medicare HMO | Admitting: Registered Nurse

## 2019-04-12 VITALS — BP 133/88 | HR 84 | Temp 96.8°F | Ht 69.0 in | Wt 251.0 lb

## 2019-04-12 DIAGNOSIS — G8921 Chronic pain due to trauma: Secondary | ICD-10-CM | POA: Diagnosis not present

## 2019-04-12 DIAGNOSIS — Z5181 Encounter for therapeutic drug level monitoring: Secondary | ICD-10-CM

## 2019-04-12 DIAGNOSIS — M8700 Idiopathic aseptic necrosis of unspecified bone: Secondary | ICD-10-CM

## 2019-04-12 DIAGNOSIS — M2550 Pain in unspecified joint: Secondary | ICD-10-CM

## 2019-04-12 DIAGNOSIS — M25551 Pain in right hip: Secondary | ICD-10-CM | POA: Diagnosis present

## 2019-04-12 DIAGNOSIS — F17210 Nicotine dependence, cigarettes, uncomplicated: Secondary | ICD-10-CM | POA: Diagnosis not present

## 2019-04-12 DIAGNOSIS — M519 Unspecified thoracic, thoracolumbar and lumbosacral intervertebral disc disorder: Secondary | ICD-10-CM

## 2019-04-12 DIAGNOSIS — M5416 Radiculopathy, lumbar region: Secondary | ICD-10-CM | POA: Diagnosis not present

## 2019-04-12 DIAGNOSIS — G8929 Other chronic pain: Secondary | ICD-10-CM

## 2019-04-12 DIAGNOSIS — M546 Pain in thoracic spine: Secondary | ICD-10-CM

## 2019-04-12 DIAGNOSIS — G894 Chronic pain syndrome: Secondary | ICD-10-CM

## 2019-04-12 DIAGNOSIS — M62838 Other muscle spasm: Secondary | ICD-10-CM

## 2019-04-12 DIAGNOSIS — M4647 Discitis, unspecified, lumbosacral region: Secondary | ICD-10-CM | POA: Diagnosis not present

## 2019-04-12 MED ORDER — HYDROCODONE-ACETAMINOPHEN 10-325 MG PO TABS: 1.0000 | ORAL_TABLET | Freq: Four times a day (QID) | ORAL | 0 refills | Status: DC | PRN

## 2019-04-12 NOTE — Progress Notes (Signed)
Subjective:    Patient ID: Matthew Sloan, male    DOB: Jan 10, 1961, 58 y.o.   MRN: [id number]  HPI: Matthew Sloan is a 58 y.o. male who returns for follow up appointment for chronic pain and medication refill. He states his pain is located in his upper- lower back radiating into his right hip and right lower extremity. He rates his pain 9. His current exercise regime is walking and performing stretching exercises.  Matthew Sloan reports he had  A fall two weeks ago he was walking into his bathroom and his righ hip and right leg gave out he tried to brace himself and landed against the wall and fell on his left side, he was able to pick himself up. Also had a near miss fall a week ago his right hip gave out, he didn't fall on the floor. He will be scheduling an appointment with his orthopedist to schedule hip replacement surgery he states.   Matthew Sloan Morphine equivalent is 40.00  MME.     Pain Inventory Average Pain 8 Pain Right Now 9 My pain is sharp, burning, stabbing, tingling and aching  In the last 24 hours, has pain interfered with the following? General activity 10 Relation with others 8 Enjoyment of life 8 What TIME of day is your pain at its worst? all Sleep (in general) Poor  Pain is worse with: walking, bending, inactivity and standing Pain improves with: medication Relief from Meds: 2  Mobility walk with assistance use a cane Do you have any goals in this area?  yes  Function disabled: date disabled . Do you have any goals in this area?  yes  Neuro/Psych weakness numbness tremor tingling trouble walking spasms  Prior Studies Any changes since last visit?  no  Physicians involved in your care Any changes since last visit?  no   Family History  Problem Relation Age of Onset  . Cancer Mother 87       breast cancer  . Heart disease Father 64       CHF   Social History   Socioeconomic History  . Marital status: Single    Spouse name: Not on file   . Number of children: Not on file  . Years of education: Not on file  . Highest education level: Not on file  Occupational History  . Not on file  Social Needs  . Financial resource strain: Not on file  . Food insecurity    Worry: Not on file    Inability: Not on file  . Transportation needs    Medical: Not on file    Non-medical: Not on file  Tobacco Use  . Smoking status: Current Every Day Smoker    Packs/day: 1.00    Years: 30.00    Pack years: 30.00    Types: Cigarettes  . Smokeless tobacco: Never Used  . Tobacco comment: working on it-down to 0.5 ppd  Substance and Sexual Activity  . Alcohol use: No  . Drug use: No  . Sexual activity: Not on file  Lifestyle  . Physical activity    Days per week: Not on file    Minutes per session: Not on file  . Stress: Not on file  Relationships  . Social Herbalist on phone: Not on file    Gets together: Not on file    Attends religious service: Not on file    Active member of club or organization: Not on file  Attends meetings of clubs or organizations: Not on file    Relationship status: Not on file  Other Topics Concern  . Not on file  Social History Narrative  . Not on file   Past Surgical History:  Procedure Laterality Date  . bilateral tubes placed in ears  04/12/2012  . CARPAL TUNNEL RELEASE  12/15/2011   Procedure: CARPAL TUNNEL RELEASE;  Surgeon: Tami RibasKevin R Kuzma, MD;  Location: [facility];  Service: Orthopedics;  Laterality: Left;  [name]. MULTIPLE TOOTH EXTRACTIONS    . SHOULDER SURGERY  2012   lt  . ULNAR TUNNEL RELEASE  12/15/2011   Procedure: CUBITAL TUNNEL RELEASE;  Surgeon: Tami RibasKevin R Kuzma, MD;  Location: [facility];  Service: Orthopedics;  Laterality: Left;  left cubital tunnel release   Past Medical History:  Diagnosis Date  . Allergy   . Chronic pain due to trauma   . Disturbance of skin sensation   . Intercostal neuralgia   . Lumbosacral neuritis   . Myocardial  infarction (HCC)   . Rotator cuff (capsule) sprain   . Snores    There were no vitals taken for this visit.  Opioid Risk Score:   Fall Risk Score:  `1  Depression screen PHQ 2/9  [facility] screen [facility] 12/16/2018 11/10/2018 10/19/2018 04/18/2018 12/23/2017 11/19/2017 08/23/2017  Decreased Interest 0 0 0 0 0 0 0  Down, Depressed, Hopeless 0 0 0 0 0 0 0  PHQ - 2 Score 0 0 0 0 0 0 0  Altered sleeping - - - - - - -  Tired, decreased energy - - - - - - -  Feeling bad or failure about yourself  - - - - - - -  Trouble concentrating - - - - - - -  Moving slowly or fidgety/restless - - - - - - -  Suicidal thoughts - - - - - - -  PHQ-9 Score - - - - - - -    Review of Systems  Constitutional: Negative.   HENT: Negative.   Eyes: Negative.   Respiratory: Negative.   Cardiovascular: Negative.   Gastrointestinal: Negative.   Endocrine: Negative.   Genitourinary: Negative.   Musculoskeletal: Positive for arthralgias, back pain, gait problem, myalgias, neck pain and neck stiffness.       Spasms   Allergic/Immunologic: Negative.   Neurological: Positive for tremors, weakness and numbness.       Tingling  Hematological: Negative.   Psychiatric/Behavioral: Negative.   All other systems reviewed and are negative.      Objective:   Physical Exam Vitals signs and nursing note reviewed.  Constitutional:      Appearance: Normal appearance.  Neck:     Musculoskeletal: Normal range of motion and neck supple.  Cardiovascular:     Rate and Rhythm: Normal rate and regular rhythm.     Pulses: Normal pulses.     Heart sounds: Normal heart sounds.  Pulmonary:     Effort: Pulmonary effort is normal.     Breath sounds: Normal breath sounds.  Musculoskeletal:     Comments: Normal Muscle Bulk and Muscle Testing Reveals:  Upper Extremities: Right: Full ROM and Muscle Strength 5/5 Left: Decreased ROM 45 Degrees and Muscle Strength 5/5  Thoracic Hypersensitivity: T-7-T-10 Lumbar Hypersensitivity  Right Greater Trochanter Tenderness Lower Extremities: Right: Decreased ROM and Muscle Strength 4/5 Right Lower Extremity Flexion Produces Pain into Lumbar, Right Hip and Right Lower Extremity Left: Full ROM and Muscle Strength 5/5  Arises from chair slowly using cane for support Antalgic  Gait   Skin:    General: Skin is warm and dry.  Neurological:     Mental Status: He is alert and oriented to person, place, and time.  Psychiatric:        Mood and Affect: Mood normal.        Behavior: Behavior normal.           Assessment & Plan:  1. 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 #120. 04/12/2019. We will continue the opioid monitoring program, this consists of regular clinic visits, examinations, urine drug screen, pill counts as well as use of [place] Controlled Substance reporting System. 2. Lumbar Radiculitis: Continuecurrent medication regimen withGabapentin.04/12/2019 3. Intercostal neuralgia: Unable to continue Flector Patch due to cost, Continuecurrent medication regimen withVoltaren Gel,Robaxin and Neurontin.04/12/2019 4. Insomnia: Continuecurrent medication regime withtrazodone.04/12/2019 5. Muscle Spasm: Continuecurrent medication regimen withRobaxin.04/12/2019 6. Polyarthralgia: Continue current medication regime. Continue to Monitor.04/12/2019 7. Chronic Bilateral Thoracic Pain: Continue HEP and Continue Current medication regimen. Continue to monitor.04/12/2019 8. Chronic Right Hip Pain/ Avascular Necrosis: ScheduleAppointment with OrthopedistDr. Dion Saucier S/P Hip Injection by Dr. Maurice Small with relief noted.[name] 04/12/2019  15 minutes of face to face patient care time was spent during this visit. All questions were encouraged and answered.  F/U in 1  month

## 2019-04-27 ENCOUNTER — Other Ambulatory Visit: Payer: Self-pay

## 2019-04-27 ENCOUNTER — Encounter: Payer: Self-pay | Admitting: "Endocrinology

## 2019-04-27 ENCOUNTER — Ambulatory Visit (INDEPENDENT_AMBULATORY_CARE_PROVIDER_SITE_OTHER): Payer: Medicare HMO | Admitting: "Endocrinology

## 2019-04-27 VITALS — BP 132/82 | HR 99 | Ht 69.0 in | Wt 255.0 lb

## 2019-04-27 DIAGNOSIS — E0590 Thyrotoxicosis, unspecified without thyrotoxic crisis or storm: Secondary | ICD-10-CM | POA: Insufficient documentation

## 2019-04-27 DIAGNOSIS — E0520 Thyrotoxicosis with toxic multinodular goiter without thyrotoxic crisis or storm: Secondary | ICD-10-CM | POA: Diagnosis not present

## 2019-04-27 MED ORDER — METHIMAZOLE 5 MG PO TABS: 5.0000 mg | ORAL_TABLET | Freq: Every day | ORAL | 3 refills | Status: DC

## 2019-04-27 NOTE — Progress Notes (Signed)
04/27/2019        Endocrinology Consult Note    Subjective:    Patient ID: Matthew Sloan, male    DOB: May 04, 1961, PCP Delanna Notice, MD.   Past Medical History:  Diagnosis Date  . Allergy   . Chronic pain due to trauma   . Disturbance of skin sensation   . Intercostal neuralgia   . Lumbosacral neuritis   . Myocardial infarction ([place])   . Rotator cuff (capsule) sprain   . Snores     Past Surgical History:  Procedure Laterality Date  . bilateral tubes placed in ears  04/12/2012  . CARPAL TUNNEL RELEASE  12/15/2011   Procedure: CARPAL TUNNEL RELEASE;  Surgeon: Tennis Must, MD;  Location: [facility];  Service: Orthopedics;  Laterality: Left;  [name] MULTIPLE TOOTH EXTRACTIONS    . SHOULDER SURGERY  2012   lt  . ULNAR TUNNEL RELEASE  12/15/2011   Procedure: CUBITAL TUNNEL RELEASE;  Surgeon: Tennis Must, MD;  Location: [facility];  Service: Orthopedics;  Laterality: Left;  left cubital tunnel release    Social History   Socioeconomic History  . Marital status: Single    Spouse name: Not on file  . Number of children: Not on file  . Years of education: Not on file  . Highest education level: Not on file  Occupational History  . Not on file  Social Needs  . Financial resource strain: Not on file  . Food insecurity    Worry: Not on file    Inability: Not on file  . Transportation needs    Medical: Not on file    Non-medical: Not on file  Tobacco Use  . Smoking status: Current Every Day Smoker    Packs/day: 1.00    Years: 30.00    Pack years: 30.00    Types: Cigarettes  . Smokeless tobacco: Never Used  . Tobacco comment: working on it-down to 0.5 ppd  Substance and Sexual Activity  . Alcohol use: No  . Drug use: No  . Sexual activity: Not on file  Lifestyle  . Physical activity    Days per week: Not on file    Minutes per session: Not on file  . Stress: Not on file  Relationships  . Social Herbalist on phone: Not on  file    Gets together: Not on file    Attends religious service: Not on file    Active member of club or organization: Not on file    Attends meetings of clubs or organizations: Not on file    Relationship status: Not on file  Other Topics Concern  . Not on file  Social History Narrative  . Not on file    Family History  Problem Relation Age of Onset  . Cancer Mother 37       breast cancer  . Heart disease Father 25       CHF    Outpatient Encounter Medications as of 04/27/2019  Medication Sig  . apixaban (ELIQUIS) 5 MG TABS tablet Take 5 mg by mouth 2 (two) times daily.  [name] gabapentin (NEURONTIN) 800 MG tablet Take 1 tablet (800 mg total) by mouth 3 (three) times daily.  [name] HYDROcodone-acetaminophen (NORCO) 10-325 MG tablet Take 1 tablet by mouth every 6 (six) hours as needed for moderate pain.  . methimazole (TAPAZOLE) 5 MG tablet Take 1 tablet (5 mg total) by mouth daily.  . methocarbamol (ROBAXIN) 500  MG tablet Take 1 tablet (500 mg total) by mouth 3 (three) times daily.  . metoprolol tartrate (LOPRESSOR) 25 MG tablet Take 1 tablet by mouth 2 (two) times daily.  . traZODone (DESYREL) 100 MG tablet TAKE 1 TABLET (100 MG TOTAL) BY MOUTH AT BEDTIME.  . [DISCONTINUED] methimazole (TAPAZOLE) 10 MG tablet Take 1 tablet by mouth daily.  . diclofenac sodium (VOLTAREN) 1 % GEL Apply 2 g topically 3 (three) times daily. Use as directed  . pravastatin (PRAVACHOL) 40 MG tablet Take 1 tablet by mouth at bedtime.  . promethazine (PHENERGAN) 12.5 MG tablet Take 12.5 mg by mouth 2 (two) times daily as needed.   No facility-administered encounter medications on file as of 04/27/2019.     ALLERGIES: Allergies  Allergen Reactions  . Penicillins     VACCINATION STATUS:  There is no immunization history on file for this patient.   HPI  Matthew Sloan is 58 y.o. male who presents today with a medical history as above. he is being seen in consultation for hyperthyroidism requested by  Delanna Notice, MD. -History was obtained directly from the patient.  Patient reports that he was diagnosed with hypothyroidism approximately 2 years ago.  He was initiated on treatment with methimazole various doses, currently on 10 mg p.o. daily.  Patient reports compliance.  He reports improvement of most of his symptoms.  At this time, patient denies palpitations, tremors, heat intolerance.  He complains of weight gain which has been happening progressively since he was started on methimazole, but more rapidly lately.   -His referral package does not indicate thyroid uptake and scan, and patient does not recall having had such a study. -His most recent thyroid function test from April 05, 2019 showed free T4 still elevated at 1.67, associated with suppressed TSH of<0.01.  he reports on and off dysphagia, choking, and shortness of breath on lying flat on his back.    -He denies recent voice change.  He is a chronic active smoker.   he denies family history of thyroid dysfunction.  He denies  family hx of thyroid cancer.   he  is willing to proceed with appropriate work up and therapy for thyrotoxicosis.                           Review of systems  Constitutional: + weight gain,  + fatigue, + subjective hyperthermia Eyes: no blurry vision, - xerophthalmia ENT: no sore throat, no nodules palpated in throat, no dysphagia/odynophagia, nor hoarseness Cardiovascular: no Chest Pain, no Shortness of Breath, -  palpitations, no leg swelling Respiratory: no cough, no SOB Gastrointestinal: no Nausea, no Vomiting, no Diarhhea Musculoskeletal: + muscle, +joint aches, uses a cane to ambulate. Skin: no rashes Neurological: -  tremors, no numbness, no tingling, no dizziness Psychiatric: no depression, -  anxiety   Objective:    BP 132/82   Pulse 99   Ht _0  (1.753 m)   Wt 255 lb (115.7 kg)   BMI 37.66 kg/m   Wt Readings from Last 3 Encounters:  04/27/19 255 lb (115.7 kg)  04/12/19 251 lb  (113.9 kg)  03/23/19 246 lb (111.6 kg)                                                Physical exam  Constitutional: Body mass index is 37.66 kg/m., not in acute distress, + stable state of mind Eyes: PERRLA, EOMI, - exophthalmos ENT: moist mucous membranes, ++  thyromegaly, no cervical lymphadenopathy Cardiovascular:  + normal precordial activity, + nomal Rate and Rhythm, no Murmur/Rubs/Gallops Respiratory:  adequate breathing efforts, no gross chest deformity, Clear to auscultation bilaterally Gastrointestinal: abdomen soft, Non -tender, No distension, Bowel Sounds present Musculoskeletal: no gross deformities, strength intact in all four extremities Skin: moist, warm, no rashes Neurological:  -  tremor with outstretched hands,  -+Deep Tendon Reflexes  on both lower extremities.   CMP     Component Value Date/Time   NA 139 01/03/2010 0545   K 4.3 01/03/2010 0545   CL 103 01/03/2010 0545   CO2 26 01/03/2010 0545   GLUCOSE 104 (H) 01/03/2010 0545   BUN 14 01/03/2010 0545   CREATININE 0.89 01/03/2010 0545   CALCIUM 8.7 01/03/2010 0545   PROT 6.7 10/10/2012 1031   ALBUMIN 4.3 10/10/2012 1031   AST 18 10/10/2012 1031   ALT 30 10/10/2012 1031   ALKPHOS 50 10/10/2012 1031   BILITOT 0.4 10/10/2012 1031   GFRNONAA >60 01/03/2010 0545   GFRAA  01/03/2010 0545    >60        The eGFR has been calculated using the MDRD equation. This calculation has not been validated in all clinical situations. eGFR's persistently <60 mL/min signify possible Chronic Kidney Disease.     CBC    Component Value Date/Time   WBC 8.6 01/07/2010 0945   RBC 3.81 (L) 01/07/2010 0945   HGB 15.7 12/15/2011 0809   HCT 33.9 (L) 01/07/2010 0945   PLT 514 (H) 01/07/2010 0945   MCV 89.1 01/07/2010 0945   MCH 29.6 01/07/2010 0945   MCHC 33.3 01/07/2010 0945   RDW 13.6 01/07/2010 0945   LYMPHSABS 2.3 01/07/2010 0945   MONOABS 0.6 01/07/2010 0945   EOSABS 0.7 01/07/2010 0945   BASOSABS 0.1  01/07/2010 0945   Recent Results (from the past 2160 hour(s))  TSH     Status: Abnormal   Collection Time: 04/05/19 12:00 AM  Result Value Ref Range   TSH 0.01 (A) 0.41 - 5.90    Comment:  free t4 1.67      Assessment & Plan:   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 his history and most recent labs are reviewed, and he was examined clinically. Subjective and objective findings are consistent with thyrotoxicosis likely from toxic nodular goiter .  The potential risks of untreated thyrotoxicosis and the need for definitive therapy have been discussed in detail with him, and he agrees to proceed with diagnostic workup and treatment plan. Options of therapy are discussed with him.   His options include continued treatment with methimazole or radioactive iodine ablation for hyperthyroidism.  However, he does have a large goiter on physical exam, and significant compression symptoms on history.    He  Will be sent for dedicated thyroid ultrasound.  If he has significant nodular findings and large goiter on ultrasound, will benefit from direct simple thyroidectomy which will resolve the hyperthyroidism as well as his neck compression symptoms.   -If the ultrasound findings not remarkable, he will be considered for thyroid uptake and scan for 5 days in preparation for I-131 thyroid ablation. -In the meantime, he is advised to lower his neck milligrams to 5 mg p.o. daily with plan to repeat thyroid function test in 9 weeks  with office visit.  -He is already on metoprolol 25 mg p.o. twice daily, did not initiate any additional beta-blockers.   - I advised him to maintain close follow up with Delanna Notice, MD for primary care needs.   - Time spent with the patient: 45 minutes, of which >50% was spent in obtaining information about his symptoms, reviewing his previous labs, evaluations, and treatments, counseling him about his toxic nodular goiter, and  developing a plan to confirm the diagnosis and long term treatment as necessary. Please refer to " Patient Self Inventory" in the Media  tab for reviewed elements of pertinent patient history.  Matthew Sloan participated in the discussions, expressed understanding, and voiced agreement with the above plans.  All questions were answered to his satisfaction. he is encouraged to contact clinic should he have any questions or concerns prior to his return visit.   Follow up plan: Return in about 9 weeks (around 06/29/2019) for Follow up with Pre-visit Labs, Thyroid / Neck Ultrasound.   Thank you for involving me in the care of this pleasant patient, and I will continue to update you with his progress.  Glade Lloyd, MD [facility] Phone: [phone]  Fax: [phone]   04/27/2019, 5:27 PM  This note was partially dictated with voice recognition software. Similar sounding words can be transcribed inadequately or may not  be corrected upon review.

## 2019-04-28 ENCOUNTER — Telehealth: Payer: Self-pay | Admitting: "Endocrinology

## 2019-04-28 NOTE — Telephone Encounter (Signed)
Matthew Sloan with [facility] called and they need a reference number her number is [phone]

## 2019-05-02 ENCOUNTER — Telehealth: Payer: Self-pay | Admitting: "Endocrinology

## 2019-05-02 NOTE — Telephone Encounter (Signed)
REF # [id number]

## 2019-05-02 NOTE — Telephone Encounter (Signed)
[facility] Medicare HMO ref # for Thyroid u/s  # [id number]

## 2019-05-09 ENCOUNTER — Encounter: Payer: Medicare HMO | Attending: Physical Medicine & Rehabilitation | Admitting: Registered Nurse

## 2019-05-09 DIAGNOSIS — M4647 Discitis, unspecified, lumbosacral region: Secondary | ICD-10-CM | POA: Insufficient documentation

## 2019-05-09 DIAGNOSIS — G8929 Other chronic pain: Secondary | ICD-10-CM | POA: Insufficient documentation

## 2019-05-09 DIAGNOSIS — G8921 Chronic pain due to trauma: Secondary | ICD-10-CM | POA: Insufficient documentation

## 2019-05-09 DIAGNOSIS — M25551 Pain in right hip: Secondary | ICD-10-CM | POA: Insufficient documentation

## 2019-05-09 DIAGNOSIS — F17210 Nicotine dependence, cigarettes, uncomplicated: Secondary | ICD-10-CM | POA: Insufficient documentation

## 2019-05-09 DIAGNOSIS — M5416 Radiculopathy, lumbar region: Secondary | ICD-10-CM | POA: Insufficient documentation

## 2019-05-16 ENCOUNTER — Other Ambulatory Visit: Payer: Self-pay

## 2019-05-16 ENCOUNTER — Encounter: Payer: Self-pay | Admitting: Registered Nurse

## 2019-05-16 ENCOUNTER — Encounter: Payer: Medicare HMO | Attending: Physical Medicine & Rehabilitation | Admitting: Registered Nurse

## 2019-05-16 VITALS — BP 158/92 | HR 74 | Temp 93.6°F | Ht 69.0 in | Wt 259.0 lb

## 2019-05-16 DIAGNOSIS — Z5181 Encounter for therapeutic drug level monitoring: Secondary | ICD-10-CM | POA: Diagnosis present

## 2019-05-16 DIAGNOSIS — G8921 Chronic pain due to trauma: Secondary | ICD-10-CM | POA: Diagnosis not present

## 2019-05-16 DIAGNOSIS — M4647 Discitis, unspecified, lumbosacral region: Secondary | ICD-10-CM | POA: Diagnosis not present

## 2019-05-16 DIAGNOSIS — M25551 Pain in right hip: Secondary | ICD-10-CM | POA: Diagnosis present

## 2019-05-16 DIAGNOSIS — M5416 Radiculopathy, lumbar region: Secondary | ICD-10-CM | POA: Diagnosis present

## 2019-05-16 DIAGNOSIS — M2550 Pain in unspecified joint: Secondary | ICD-10-CM

## 2019-05-16 DIAGNOSIS — G8929 Other chronic pain: Secondary | ICD-10-CM | POA: Diagnosis present

## 2019-05-16 DIAGNOSIS — R590 Localized enlarged lymph nodes: Secondary | ICD-10-CM

## 2019-05-16 DIAGNOSIS — M519 Unspecified thoracic, thoracolumbar and lumbosacral intervertebral disc disorder: Secondary | ICD-10-CM

## 2019-05-16 DIAGNOSIS — G894 Chronic pain syndrome: Secondary | ICD-10-CM | POA: Insufficient documentation

## 2019-05-16 DIAGNOSIS — Z79891 Long term (current) use of opiate analgesic: Secondary | ICD-10-CM

## 2019-05-16 DIAGNOSIS — F17210 Nicotine dependence, cigarettes, uncomplicated: Secondary | ICD-10-CM | POA: Insufficient documentation

## 2019-05-16 DIAGNOSIS — M62838 Other muscle spasm: Secondary | ICD-10-CM

## 2019-05-16 DIAGNOSIS — M546 Pain in thoracic spine: Secondary | ICD-10-CM

## 2019-05-16 DIAGNOSIS — M8700 Idiopathic aseptic necrosis of unspecified bone: Secondary | ICD-10-CM

## 2019-05-16 MED ORDER — HYDROCODONE-ACETAMINOPHEN 10-325 MG PO TABS: 1.0000 | ORAL_TABLET | Freq: Four times a day (QID) | ORAL | 0 refills | Status: DC | PRN

## 2019-05-16 NOTE — Progress Notes (Signed)
Subjective:    Patient ID: Matthew Sloan, male    DOB: [birth year], 58 y.o.   MRN: [id number]  HPI: Matthew Sloan is a 58 y.o. male who returns for follow up appointment for chronic pain and medication refill. He states his pain is located in his neck, mid-lower back pain radiating into her right hip and right lower extremity.He rates his pain 9. His current exercise regime is walking and performing stretching exercises.  Matthew Sloan Morphine equivalent is 40.00  MME.  UDS was Performed Today.   Pain Inventory Average Pain 9 Pain Right Now 9 My pain is constant, sharp, burning, stabbing, tingling and aching  In the last 24 hours, has pain interfered with the following? General activity 10 Relation with others 8 Enjoyment of life 8 What TIME of day is your pain at its worst? all Sleep (in general) Poor  Pain is worse with: walking, bending and standing Pain improves with: medication Relief from Meds: 2  Mobility walk with assistance use a cane  Function disabled: date disabled na  Neuro/Psych weakness numbness tremor tingling trouble walking spasms dizziness  Prior Studies Any changes since last visit?  no  Physicians involved in your care Any changes since last visit?  no   Family History  Problem Relation Age of Onset  . Cancer Mother 4       breast cancer  . Heart disease Father 44       CHF   Social History   Socioeconomic History  . Marital status: Single    Spouse name: Not on file  . Number of children: Not on file  . Years of education: Not on file  . Highest education level: Not on file  Occupational History  . Not on file  Social Needs  . Financial resource strain: Not on file  . Food insecurity    Worry: Not on file    Inability: Not on file  . Transportation needs    Medical: Not on file    Non-medical: Not on file  Tobacco Use  . Smoking status: Current Every Day Smoker    Packs/day: 1.00    Years: 30.00    Pack years: 30.00    Types: Cigarettes  . Smokeless tobacco: Never Used  . Tobacco comment: working on it-down to 0.5 ppd  Substance and Sexual Activity  . Alcohol use: No  . Drug use: No  . Sexual activity: Not on file  Lifestyle  . Physical activity    Days per week: Not on file    Minutes per session: Not on file  . Stress: Not on file  Relationships  . Social Herbalist on phone: Not on file    Gets together: Not on file    Attends religious service: Not on file    Active member of club or organization: Not on file    Attends meetings of clubs or organizations: Not on file    Relationship status: Not on file  Other Topics Concern  . Not on file  Social History Narrative  . Not on file   Past Surgical History:  Procedure Laterality Date  . bilateral tubes placed in ears  04/12/2012  . CARPAL TUNNEL RELEASE  12/15/2011   Procedure: CARPAL TUNNEL RELEASE;  Surgeon: Tennis Must, MD;  Location: [facility];  Service: Orthopedics;  Laterality: Left;  [name] MULTIPLE TOOTH EXTRACTIONS    . SHOULDER SURGERY  2012   lt  .  ULNAR TUNNEL RELEASE  12/15/2011   Procedure: CUBITAL TUNNEL RELEASE;  Surgeon: Tami RibasKevin R Kuzma, MD;  Location: [facility];  Service: Orthopedics;  Laterality: Left;  left cubital tunnel release   Past Medical History:  Diagnosis Date  . Allergy   . Chronic pain due to trauma   . Disturbance of skin sensation   . Intercostal neuralgia   . Lumbosacral neuritis   . Myocardial infarction (HCC)   . Rotator cuff (capsule) sprain   . Snores    BP (!) 158/92   Pulse 74   Temp (!) 93.6 F (34.2 C)   Ht 5\' 9"  (1.753 m)   Wt 259 lb (117.5 kg)   SpO2 96%   BMI 38.25 kg/m   Opioid Risk Score:   Fall Risk Score:  `1  Depression screen PHQ 2/9  [facility] 12/16/2018 11/10/2018 10/19/2018 04/18/2018 12/23/2017 11/19/2017 08/23/2017  Decreased Interest 0 0 0 0 0 0 0  Down, Depressed, Hopeless 0 0 0 0 0 0 0  PHQ - 2 Score 0 0 0 0 0 0 0   Altered sleeping - - - - - - -  Tired, decreased energy - - - - - - -  Feeling bad or failure about yourself  - - - - - - -  Trouble concentrating - - - - - - -  Moving slowly or fidgety/restless - - - - - - -  Suicidal thoughts - - - - - - -  PHQ-9 Score - - - - - - -    Review of Systems  Musculoskeletal: Positive for gait problem.  Neurological: Positive for dizziness, weakness and numbness.  All other systems reviewed and are negative.      Objective:   Physical Exam Vitals signs and nursing note reviewed.  Constitutional:      Appearance: Normal appearance.  Neck:     Musculoskeletal: Normal range of motion and neck supple.     Comments: Cervical Lymphadenopathy Cardiovascular:     Rate and Rhythm: Normal rate and regular rhythm.     Pulses: Normal pulses.     Heart sounds: Normal heart sounds.  Pulmonary:     Effort: Pulmonary effort is normal.     Breath sounds: Normal breath sounds.  Musculoskeletal:     Comments: Normal Muscle Bulk and Muscle Testing Reveals: Upper Extremities: Right Full ROM and Muscle Strength 5/5 Left: Decreased ROM 45 Degrees  And Muscle Strength 5/5  Thoracic Hypersensitivity Lumbar Hypersensitivity Right Greater Trochanter Tenderness  Lower Extremities: Right: Decreased ROM and Muscle Strength 5/5 Left Full ROM and Muscle Strength 5/5 Arises from Table slowly using cane for support Antalgic Gait   Skin:    General: Skin is warm and dry.  Neurological:     Mental Status: He is alert and oriented to person, place, and time.  Psychiatric:        Mood and Affect: Mood normal.        Behavior: Behavior normal.           Assessment & Plan:  1. 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 #120. 05/16/2019. We will continue the opioid  monitoring program, this consists of regular clinic visits, examinations, urine drug screen, pill counts as well as use of [place] Controlled Substance reporting [place]. 2. Lumbar Radiculitis: Continuecurrent  medication regimen withGabapentin.05/16/2019 3. Intercostal neuralgia: Unable to continue Flector Patch due to cost, Continuecurrent medication regimen withVoltaren Gel,Robaxin and Neurontin.05/16/2019 4. Insomnia: Continuecurrent medication regime withtrazodone.05/16/2019 5. Muscle Spasm: Continuecurrent medication regimen withRobaxin.05/16/2019 6. Polyarthralgia: Continue current medication regime. Continue to Monitor.05/16/2019 7. Chronic Bilateral Thoracic Pain: Continue HEP and Continue Current medication regimen. Continue to monitor.05/16/2019 8. Chronic Right Hip Pain/ Avascular Necrosis: Awaiting Appointment with OrthopedistDr. Dion Saucier S/P Hip Injection by Dr. Maurice Small with relief noted.. 05/16/2019 9. Cervical Lymphadenopathy: Endocrinologist Following. Continue to monitor.   15 minutes of face to face patient care time was spent during this visit. All questions were encouraged and answered.  F/U in 1 month

## 2019-05-19 LAB — TOXASSURE SELECT,+ANTIDEPR,UR

## 2019-05-22 ENCOUNTER — Telehealth: Payer: Self-pay | Admitting: *Deleted

## 2019-05-22 NOTE — Telephone Encounter (Signed)
Urine drug screen for this encounter is consistent for prescribed medication 

## 2019-06-01 ENCOUNTER — Telehealth: Payer: Self-pay

## 2019-06-01 NOTE — Telephone Encounter (Signed)
-----   Message from Cassandria Anger, MD sent at 05/31/2019 10:01 AM EST ----- His ultrasound is reviewed, no nodules. He has to do his labs and do phone visit.

## 2019-06-01 NOTE — Telephone Encounter (Signed)
Pt.notified

## 2019-06-13 ENCOUNTER — Encounter: Payer: Medicare HMO | Admitting: Registered Nurse

## 2019-06-14 ENCOUNTER — Other Ambulatory Visit: Payer: Self-pay

## 2019-06-14 ENCOUNTER — Encounter: Payer: Medicare HMO | Attending: Physical Medicine & Rehabilitation | Admitting: Registered Nurse

## 2019-06-14 ENCOUNTER — Encounter: Payer: Self-pay | Admitting: Registered Nurse

## 2019-06-14 VITALS — BP 136/81 | HR 86 | Temp 98.6°F | Ht 69.0 in | Wt 264.0 lb

## 2019-06-14 DIAGNOSIS — F17210 Nicotine dependence, cigarettes, uncomplicated: Secondary | ICD-10-CM | POA: Insufficient documentation

## 2019-06-14 DIAGNOSIS — M4647 Discitis, unspecified, lumbosacral region: Secondary | ICD-10-CM | POA: Insufficient documentation

## 2019-06-14 DIAGNOSIS — Z5181 Encounter for therapeutic drug level monitoring: Secondary | ICD-10-CM | POA: Diagnosis present

## 2019-06-14 DIAGNOSIS — M519 Unspecified thoracic, thoracolumbar and lumbosacral intervertebral disc disorder: Secondary | ICD-10-CM | POA: Diagnosis not present

## 2019-06-14 DIAGNOSIS — G8921 Chronic pain due to trauma: Secondary | ICD-10-CM

## 2019-06-14 DIAGNOSIS — M8700 Idiopathic aseptic necrosis of unspecified bone: Secondary | ICD-10-CM

## 2019-06-14 DIAGNOSIS — M5416 Radiculopathy, lumbar region: Secondary | ICD-10-CM

## 2019-06-14 DIAGNOSIS — G8929 Other chronic pain: Secondary | ICD-10-CM

## 2019-06-14 DIAGNOSIS — M546 Pain in thoracic spine: Secondary | ICD-10-CM

## 2019-06-14 DIAGNOSIS — Z79891 Long term (current) use of opiate analgesic: Secondary | ICD-10-CM

## 2019-06-14 DIAGNOSIS — G894 Chronic pain syndrome: Secondary | ICD-10-CM

## 2019-06-14 DIAGNOSIS — M25551 Pain in right hip: Secondary | ICD-10-CM

## 2019-06-14 MED ORDER — HYDROCODONE-ACETAMINOPHEN 10-325 MG PO TABS: 1.0000 | ORAL_TABLET | Freq: Four times a day (QID) | ORAL | 0 refills | Status: DC | PRN

## 2019-06-14 NOTE — Progress Notes (Signed)
Subjective:    Patient ID: Matthew Sloan, male    DOB: 1961-03-06, 58 y.o.   MRN: [id number]  HPI: Matthew JubaJohn L Sundeen is a 58 y.o. male who returns for follow up appointment for chronic pain and medication refill. He states his pain is located in his mid- lower back radiating into his bilateral lower extremities R>L and right hip. He rates his pain 9. His current exercise regime is walking and performing stretching exercises.  [facility] was called, they were advise Matthew Sloan wasn't noted on the PMP. Spoke with pharmacist, she stated they are using a third party company and will be calling the company. Matthew Sloan last prescription was filled on 05/16/2019.   Matthew Sloan Morphine equivalent is 40.00 MME.  Last UDS was Performed on 05/16/2019, it was consistent.    Pain Inventory Average Pain 9 Pain Right Now 9 My pain is constant  In the last 24 hours, has pain interfered with the following? General activity 10 Relation with others 8 Enjoyment of life 8 What TIME of day is your pain at its worst? all Sleep (in general) Poor  Pain is worse with: walking, bending, sitting, inactivity and standing Pain improves with: medication Relief from Meds: 2  Mobility walk with assistance use a cane  Function disabled: date disabled .  Neuro/Psych weakness numbness tremor tingling trouble walking spasms dizziness  Prior Studies Any changes since last visit?  no  Physicians involved in your care Any changes since last visit?  no   Family History  Problem Relation Age of Onset   Cancer Mother [id number]       breast cancer   Heart disease Father [id number]       CHF   Social History   Socioeconomic History   Marital status: Single    Spouse name: Not on file   Number of children: Not on file   Years of education: Not on file   Highest education level: Not on file  Occupational History   Not on file  Social Needs   Financial resource strain: Not on  file   Food insecurity    Worry: Not on file    Inability: Not on file   Transportation needs    Medical: Not on file    Non-medical: Not on file  Tobacco Use   Smoking status: Current Every Day Smoker    Packs/day: 1.00    Years: 30.00    Pack years: 30.00    Types: Cigarettes   Smokeless tobacco: Never Used   Tobacco comment: working on it-down to 0.5 ppd  Substance and Sexual Activity   Alcohol use: No   Drug use: No   Sexual activity: Not on file  Lifestyle   Physical activity    Days per week: Not on file    Minutes per session: Not on file   Stress: Not on file  Relationships   Social connections    Talks on phone: Not on file    Gets together: Not on file    Attends religious service: Not on file    Active member of club or organization: Not on file    Attends meetings of clubs or organizations: Not on file    Relationship status: Not on file  Other Topics Concern   Not on file  Social History Narrative   Not on file   Past Surgical History:  Procedure Laterality Date   bilateral tubes placed in ears  04/12/2012  CARPAL TUNNEL RELEASE  12/15/2011   Procedure: CARPAL TUNNEL RELEASE;  Surgeon: Tennis Must, MD;  Location: [place];  Service: Orthopedics;  Laterality: Left;   MULTIPLE TOOTH EXTRACTIONS     SHOULDER SURGERY  2012   lt   ULNAR TUNNEL RELEASE  12/15/2011   Procedure: CUBITAL TUNNEL RELEASE;  Surgeon: Tennis Must, MD;  Location: [facility];  Service: Orthopedics;  Laterality: Left;  left cubital tunnel release   Past Medical History:  Diagnosis Date   Allergy    Chronic pain due to trauma    Disturbance of skin sensation    Intercostal neuralgia    Lumbosacral neuritis    Myocardial infarction (HCC)    Rotator cuff (capsule) sprain    Snores    BP 136/81    Pulse 86    Temp 98.6 F (37 C)    Ht 5\' 9"  (1.753 m)    Wt 264 lb (119.7 kg)    SpO2 94%    BMI 38.99 kg/m   Opioid Risk  Score:   Fall Risk Score:  `1  Depression screen PHQ 2/9  Depression screen [facility] 2/9 12/16/2018 11/10/2018 10/19/2018 04/18/2018 12/23/2017 11/19/2017 08/23/2017  Decreased Interest 0 0 0 0 0 0 0  Down, Depressed, Hopeless 0 0 0 0 0 0 0  PHQ - 2 Score 0 0 0 0 0 0 0  Altered sleeping - - - - - - -  Tired, decreased energy - - - - - - -  Feeling bad or failure about yourself  - - - - - - -  Trouble concentrating - - - - - - -  Moving slowly or fidgety/restless - - - - - - -  Suicidal thoughts - - - - - - -  PHQ-9 Score - - - - - - -    Review of Systems  Constitutional: Negative.   HENT: Negative.   Eyes: Negative.   Respiratory: Negative.   Cardiovascular: Negative.   Gastrointestinal: Negative.   Endocrine: Negative.   Genitourinary: Negative.   Musculoskeletal: Positive for arthralgias, back pain, gait problem, neck pain and neck stiffness.       Spasms   Neurological: Positive for dizziness, tremors, weakness and numbness.       Tingling   Hematological: Negative.   Psychiatric/Behavioral: Negative.   All other systems reviewed and are negative.      Objective:   Physical Exam Vitals signs and nursing note reviewed.  Constitutional:      Appearance: Normal appearance. He is obese.  Neck:     Musculoskeletal: Normal range of motion and neck supple.  Cardiovascular:     Rate and Rhythm: Normal rate and regular rhythm.     Pulses: Normal pulses.     Heart sounds: Normal heart sounds.  Pulmonary:     Effort: Pulmonary effort is normal.     Breath sounds: Normal breath sounds.  Musculoskeletal:     Comments: Normal Muscle Bulk and Muscle Testing Reveals:  Upper Extremities: Right: Decreased ROM 90 Degrees and Muscle Strength  5/5 Left: Decreased ROM 45 Degrees and Muscle Strength 5/5 Bilateral AC Joint Tenderness Thoracic and Lumbar Hypersensitivity Right Greater Trochanter Tenderness Lower Extremities: Right: Decreased ROM and Muscle Strength 5/5 Right Lower Extremity  Flexion Produces Pain into Lumbar, Right Hip and Right Lower Extremity Left Lower Extremity: Full ROM and Muscle Strength 5/5 Arises from chair slowly using cane for support Antalgic Gait   Skin:  General: Skin is warm and dry.  Neurological:     Mental Status: He is alert and oriented to person, place, and time.  Psychiatric:        Mood and Affect: Mood normal.        Behavior: Behavior normal.           Assessment & Plan:  1. 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 We will continue the opioid monitoring program, this consists of regular clinic visits, examinations, urine drug screen, pill counts as well as use of [place] Controlled Substance reporting System. 2. Lumbar Radiculitis: Continuecurrent medication regimen withGabapentin.06/14/2019 3. Intercostal neuralgia: Unable to continue Flector Patch due to cost, Continuecurrent medication regimen withVoltaren Gel,Robaxin and Neurontin.06/14/2019 4. Insomnia: Continuecurrent medication regime withtrazodone.06/14/2019 5. Muscle Spasm: Continuecurrent medication regimen withRobaxin.06/14/2019 6. Polyarthralgia: Continue current medication regime. Continue to Monitor.06/14/2019 7. Chronic Bilateral Thoracic Pain: Continue HEP and Continue Current medication regimen. Continue to monitor.06/14/2019 8. Chronic Right Hip Pain/ Avascular Necrosis: Awaiting Appointment with OrthopedistDr. Dion Saucier S/P Hip Injection by Dr. Sherlyn Lees relief noted.. 06/14/2019 9. Cervical Lymphadenopathy: Endocrinologist Following. Continue to monitor.   of face to face patient care time was spent during this visit. All questions were encouraged and answered.  F/U in 1 month

## 2019-07-03 ENCOUNTER — Ambulatory Visit: Payer: Medicare HMO | Admitting: "Endocrinology

## 2019-07-17 ENCOUNTER — Encounter: Payer: Medicare HMO | Attending: Physical Medicine & Rehabilitation | Admitting: Registered Nurse

## 2019-07-17 ENCOUNTER — Other Ambulatory Visit: Payer: Self-pay

## 2019-07-17 ENCOUNTER — Encounter: Payer: Self-pay | Admitting: Registered Nurse

## 2019-07-17 VITALS — BP 143/81 | HR 98 | Temp 97.7°F | Ht 69.0 in | Wt 258.0 lb

## 2019-07-17 DIAGNOSIS — M5416 Radiculopathy, lumbar region: Secondary | ICD-10-CM | POA: Diagnosis not present

## 2019-07-17 DIAGNOSIS — M546 Pain in thoracic spine: Secondary | ICD-10-CM

## 2019-07-17 DIAGNOSIS — Z5181 Encounter for therapeutic drug level monitoring: Secondary | ICD-10-CM | POA: Diagnosis present

## 2019-07-17 DIAGNOSIS — M2550 Pain in unspecified joint: Secondary | ICD-10-CM

## 2019-07-17 DIAGNOSIS — G8929 Other chronic pain: Secondary | ICD-10-CM | POA: Diagnosis present

## 2019-07-17 DIAGNOSIS — G894 Chronic pain syndrome: Secondary | ICD-10-CM | POA: Insufficient documentation

## 2019-07-17 DIAGNOSIS — M4647 Discitis, unspecified, lumbosacral region: Secondary | ICD-10-CM | POA: Diagnosis not present

## 2019-07-17 DIAGNOSIS — G8921 Chronic pain due to trauma: Secondary | ICD-10-CM | POA: Diagnosis not present

## 2019-07-17 DIAGNOSIS — Z79891 Long term (current) use of opiate analgesic: Secondary | ICD-10-CM | POA: Insufficient documentation

## 2019-07-17 DIAGNOSIS — M519 Unspecified thoracic, thoracolumbar and lumbosacral intervertebral disc disorder: Secondary | ICD-10-CM

## 2019-07-17 DIAGNOSIS — F17210 Nicotine dependence, cigarettes, uncomplicated: Secondary | ICD-10-CM | POA: Diagnosis not present

## 2019-07-17 DIAGNOSIS — M25551 Pain in right hip: Secondary | ICD-10-CM | POA: Diagnosis present

## 2019-07-17 DIAGNOSIS — M8700 Idiopathic aseptic necrosis of unspecified bone: Secondary | ICD-10-CM

## 2019-07-17 MED ORDER — HYDROCODONE-ACETAMINOPHEN 10-325 MG PO TABS: 1.0000 | ORAL_TABLET | Freq: Four times a day (QID) | ORAL | 0 refills | Status: DC | PRN

## 2019-07-17 NOTE — Progress Notes (Signed)
Subjective:    Patient ID: Matthew Sloan, male    DOB: [birth year], 59 y.o.   MRN: [id number]  HPI: Matthew Sloan is a 58 y.o. male who returns for follow up appointment for chronic pain and medication refill. He states his pain is located in his mid- lower back radiating into his right hip and right lower extremity. Also reports generalized joint pain. He rates his  Pain 9. His  current exercise regime is walking and performing stretching exercises.  Mr. Crotty Morphine equivalent is 40.00  MME. Last UDS was Performed on 05/16/2019, it was consistent.     Pain Inventory Average Pain 8 Pain Right Now 9 My pain is constant, sharp, burning, dull, stabbing, tingling and aching  In the last 24 hours, has pain interfered with the following? General activity 10 Relation with others 8 Enjoyment of life 8 What TIME of day is your pain at its worst? all Sleep (in general) Poor  Pain is worse with: walking, bending, sitting, inactivity and standing Pain improves with: medication Relief from Meds: 2  Mobility walk with assistance use a cane Do you have any goals in this area?  yes  Function disabled: date disabled . Do you have any goals in this area?  yes  Neuro/Psych weakness numbness tremor tingling trouble walking spasms  Prior Studies Any changes since last visit?  no  Physicians involved in your care Any changes since last visit?  no   Family History  Problem Relation Age of Onset  . Cancer Mother 25       breast cancer  . Heart disease Father 68       CHF   Social History   Socioeconomic History  . Marital status: Single    Spouse name: Not on file  . Number of children: Not on file  . Years of education: Not on file  . Highest education level: Not on file  Occupational History  . Not on file  Tobacco Use  . Smoking status: Current Every Day Smoker    Packs/day: 1.00    Years: 30.00    Pack years: 30.00    Types: Cigarettes  . Smokeless tobacco:  Never Used  . Tobacco comment: working on it-down to 0.5 ppd  Substance and Sexual Activity  . Alcohol use: No  . Drug use: No  . Sexual activity: Not on file  Other Topics Concern  . Not on file  Social History Narrative  . Not on file   Social Determinants of Health   Financial Resource Strain:   . Difficulty of Paying Living Expenses: Not on file  Food Insecurity:   . Worried About Programme researcher, broadcasting/film/video in the Last Year: Not on file  . Ran Out of Food in the Last Year: Not on file  Transportation Needs:   . Lack of Transportation (Medical): Not on file  . Lack of Transportation (Non-Medical): Not on file  Physical Activity:   . Days of Exercise per Week: Not on file  . Minutes of Exercise per Session: Not on file  Stress:   . Feeling of Stress : Not on file  Social Connections:   . Frequency of Communication with Friends and Family: Not on file  . Frequency of Social Gatherings with Friends and Family: Not on file  . Attends Religious Services: Not on file  . Active Member of Clubs or Organizations: Not on file  . Attends Banker Meetings: Not on file  .  Marital Status: Not on file   Past Surgical History:  Procedure Laterality Date  . bilateral tubes placed in ears  04/12/2012  . CARPAL TUNNEL RELEASE  12/15/2011   Procedure: CARPAL TUNNEL RELEASE;  Surgeon: [name], MD;  Location: [facility];  Service: Orthopedics;  Laterality: Left;  [name] MULTIPLE TOOTH EXTRACTIONS    . SHOULDER SURGERY  2012   lt  . ULNAR TUNNEL RELEASE  12/15/2011   Procedure: CUBITAL TUNNEL RELEASE;  Surgeon: [name], MD;  Location: [facility];  Service: Orthopedics;  Laterality: Left;  left cubital tunnel release   Past Medical History:  Diagnosis Date  . Allergy   . Chronic pain due to trauma   . Disturbance of skin sensation   . Intercostal neuralgia   . Lumbosacral neuritis   . Myocardial infarction (HCC)   . Rotator cuff (capsule) sprain     . Snores    BP (!) 143/81   Pulse 98   Temp 97.7 F (36.5 C)   Ht 5\' 9"  (1.753 m)   Wt 258 lb (117 kg)   SpO2 95%   BMI 38.10 kg/m   Opioid Risk Score:   Fall Risk Score:  `1  Depression screen PHQ 2/9  Depression screen [facility] 2/9 12/16/2018 11/10/2018 10/19/2018 04/18/2018 12/23/2017 11/19/2017 08/23/2017  Decreased Interest 0 0 0 0 0 0 0  Down, Depressed, Hopeless 0 0 0 0 0 0 0  PHQ - 2 Score 0 0 0 0 0 0 0  Altered sleeping - - - - - - -  Tired, decreased energy - - - - - - -  Feeling bad or failure about yourself  - - - - - - -  Trouble concentrating - - - - - - -  Moving slowly or fidgety/restless - - - - - - -  Suicidal thoughts - - - - - - -  PHQ-9 Score - - - - - - -    Review of Systems  Constitutional: Negative.   HENT: Negative.   Eyes: Negative.   Respiratory: Negative.   Cardiovascular: Negative.   Gastrointestinal: Negative.   Endocrine: Negative.   Genitourinary: Negative.   Musculoskeletal: Positive for arthralgias, back pain and gait problem.       Spasms  Skin: Negative.   Allergic/Immunologic: Negative.   Neurological: Positive for tremors, weakness and numbness.       Tingling  Hematological: Negative.   Psychiatric/Behavioral: Negative.   All other systems reviewed and are negative.      Objective:   Physical Exam Vitals and nursing note reviewed.  Constitutional:      Appearance: Normal appearance.  Cardiovascular:     Rate and Rhythm: Normal rate and regular rhythm.     Pulses: Normal pulses.     Heart sounds: Normal heart sounds.  Pulmonary:     Effort: Pulmonary effort is normal.     Breath sounds: Normal breath sounds.  Musculoskeletal:     Cervical back: Normal range of motion and neck supple.     Comments: Normal Muscle Bulk and Muscle Testing Reveals:  Upper Extremities: Right: Full ROM and Muscle Strength 5/5 Left: Decreased ROM 45 Degrees and Muscle Strength 4/5 Lumbar Hypersensitivity Right Greater Trochanter Tenderness Lower  Extremities: Right: Decreased ROM and Muscle Strength 5/5  Right Lower Extremity Flexion Produces Pain into his Right Hip and Right Lower Extremity Left: Full ROM and Muscle Strength 5/5 Arises from chair slowly using cane  for support Antalgic Gait   Skin:    General: Skin is warm and dry.  Neurological:     Mental Status: He is alert and oriented to person, place, and time.  Psychiatric:        Mood and Affect: Mood normal.        Behavior: Behavior normal.           Assessment & Plan:  1. 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 2. Lumbar Radiculitis: Continuecurrent medication regimen withGabapentin.07/20/2019 3. Intercostal neuralgia: Unable to continue Flector Patch due to cost, Continuecurrent medication regimen withVoltaren Gel,Robaxin and Neurontin.07/20/2019 4. Insomnia: Continuecurrent medication regime withtrazodone.07/20/2019 5. Muscle Spasm: Continuecurrent medication regimen withRobaxin.07/20/2019 6. Polyarthralgia: Continue current medication regime. Continue to Monitor.07/20/2019 7. Chronic Bilateral Thoracic Pain: Continue HEP and Continue Current medication regimen. Continue to monitor.07/20/2019 8. Chronic Right Hip Pain/ Avascular Necrosis:AwaitingAppointment with OrthopedistDr. Mardelle Matte S/P Hip Injection by Dr. Troy Sine relief noted.[name] 07/20/2019 9. Cervical Lymphadenopathy: Endocrinologist Following. Continue to monitor.07/20/2019  42minutes of face to face patient care time was spent during this visit. All questions  were encouraged and answered.   F/U in 1 month

## 2019-08-16 ENCOUNTER — Encounter: Payer: Medicare HMO | Attending: Physical Medicine & Rehabilitation | Admitting: Registered Nurse

## 2019-08-16 DIAGNOSIS — Z5181 Encounter for therapeutic drug level monitoring: Secondary | ICD-10-CM | POA: Insufficient documentation

## 2019-08-16 DIAGNOSIS — M5416 Radiculopathy, lumbar region: Secondary | ICD-10-CM | POA: Insufficient documentation

## 2019-08-16 DIAGNOSIS — G8921 Chronic pain due to trauma: Secondary | ICD-10-CM | POA: Insufficient documentation

## 2019-08-16 DIAGNOSIS — F17210 Nicotine dependence, cigarettes, uncomplicated: Secondary | ICD-10-CM | POA: Insufficient documentation

## 2019-08-16 DIAGNOSIS — Z79891 Long term (current) use of opiate analgesic: Secondary | ICD-10-CM | POA: Insufficient documentation

## 2019-08-16 DIAGNOSIS — M4647 Discitis, unspecified, lumbosacral region: Secondary | ICD-10-CM | POA: Insufficient documentation

## 2019-08-16 DIAGNOSIS — G8929 Other chronic pain: Secondary | ICD-10-CM | POA: Insufficient documentation

## 2019-08-16 DIAGNOSIS — G894 Chronic pain syndrome: Secondary | ICD-10-CM | POA: Insufficient documentation

## 2019-08-16 DIAGNOSIS — M25551 Pain in right hip: Secondary | ICD-10-CM | POA: Insufficient documentation

## 2019-08-17 ENCOUNTER — Encounter: Payer: Medicare HMO | Admitting: Registered Nurse

## 2019-08-17 ENCOUNTER — Other Ambulatory Visit: Payer: Self-pay

## 2019-08-17 ENCOUNTER — Encounter: Payer: Self-pay | Admitting: Registered Nurse

## 2019-08-17 VITALS — BP 131/89 | HR 79 | Temp 98.4°F | Ht 69.0 in | Wt 254.0 lb

## 2019-08-17 DIAGNOSIS — G8921 Chronic pain due to trauma: Secondary | ICD-10-CM | POA: Diagnosis not present

## 2019-08-17 DIAGNOSIS — Z79891 Long term (current) use of opiate analgesic: Secondary | ICD-10-CM

## 2019-08-17 DIAGNOSIS — G8929 Other chronic pain: Secondary | ICD-10-CM | POA: Diagnosis present

## 2019-08-17 DIAGNOSIS — M519 Unspecified thoracic, thoracolumbar and lumbosacral intervertebral disc disorder: Secondary | ICD-10-CM

## 2019-08-17 DIAGNOSIS — M546 Pain in thoracic spine: Secondary | ICD-10-CM

## 2019-08-17 DIAGNOSIS — Z5181 Encounter for therapeutic drug level monitoring: Secondary | ICD-10-CM | POA: Diagnosis present

## 2019-08-17 DIAGNOSIS — F17210 Nicotine dependence, cigarettes, uncomplicated: Secondary | ICD-10-CM | POA: Diagnosis not present

## 2019-08-17 DIAGNOSIS — M25551 Pain in right hip: Secondary | ICD-10-CM | POA: Diagnosis present

## 2019-08-17 DIAGNOSIS — M4647 Discitis, unspecified, lumbosacral region: Secondary | ICD-10-CM | POA: Diagnosis not present

## 2019-08-17 DIAGNOSIS — M8700 Idiopathic aseptic necrosis of unspecified bone: Secondary | ICD-10-CM

## 2019-08-17 DIAGNOSIS — G894 Chronic pain syndrome: Secondary | ICD-10-CM | POA: Diagnosis present

## 2019-08-17 DIAGNOSIS — M2550 Pain in unspecified joint: Secondary | ICD-10-CM

## 2019-08-17 DIAGNOSIS — M5416 Radiculopathy, lumbar region: Secondary | ICD-10-CM | POA: Diagnosis present

## 2019-08-17 MED ORDER — HYDROCODONE-ACETAMINOPHEN 10-325 MG PO TABS: 1.0000 | ORAL_TABLET | Freq: Four times a day (QID) | ORAL | 0 refills | Status: DC | PRN

## 2019-08-17 NOTE — Progress Notes (Signed)
Subjective:    Patient ID: Matthew Sloan, male    DOB: 11-21-1960, 59 y.o.   MRN: [id number]  HPI: Matthew Sloan is a 59 y.o. male who returns for follow up appointment for chronic pain and medication refill. He states his pain is located in his left shoulder, mid- lower back pain radiating into his right hip and  right lower extremity. He rates his pain 9. His current exercise regime is walking and performing stretching exercises.  Matthew Sloan Morphine equivalent is 40.00  MME.  Last UDS was Performed on 05/16/2019, it was consistent.   Pain Inventory Average Pain 8 Pain Right Now 9 My pain is aching  In the last 24 hours, has pain interfered with the following? General activity 8 Relation with others 8 Enjoyment of life 8 What TIME of day is your pain at its worst? al Sleep (in general) Poor  Pain is worse with: walking, bending, sitting, inactivity and standing Pain improves with: medication Relief from Meds: 2  Mobility use a cane how many minutes can you walk? not far do you drive?  yes  Function disabled: date disabled . Do you have any goals in this area?  yes  Neuro/Psych weakness numbness tremor tingling trouble walking spasms  Prior Studies Any changes since last visit?  no  Physicians involved in your care Any changes since last visit?  no   Family History  Problem Relation Age of Onset  . Cancer Mother 65       breast cancer  . Heart disease Father 35       CHF   Social History   Socioeconomic History  . Marital status: Single    Spouse name: Not on file  . Number of children: Not on file  . Years of education: Not on file  . Highest education level: Not on file  Occupational History  . Not on file  Tobacco Use  . Smoking status: Current Every Day Smoker    Packs/day: 1.00    Years: 30.00    Pack years: 30.00    Types: Cigarettes  . Smokeless tobacco: Never Used  . Tobacco comment: working on it-down to 0.5 ppd  Substance and  Sexual Activity  . Alcohol use: No  . Drug use: No  . Sexual activity: Not on file  Other Topics Concern  . Not on file  Social History Narrative  . Not on file   Social Determinants of Health   Financial Resource Strain:   . Difficulty of Paying Living Expenses: Not on file  Food Insecurity:   . Worried About Charity fundraiser in the Last Year: Not on file  . Ran Out of Food in the Last Year: Not on file  Transportation Needs:   . Lack of Transportation (Medical): Not on file  . Lack of Transportation (Non-Medical): Not on file  Physical Activity:   . Days of Exercise per Week: Not on file  . Minutes of Exercise per Session: Not on file  Stress:   . Feeling of Stress : Not on file  Social Connections:   . Frequency of Communication with Friends and Family: Not on file  . Frequency of Social Gatherings with Friends and Family: Not on file  . Attends Religious Services: Not on file  . Active Member of Clubs or Organizations: Not on file  . Attends Archivist Meetings: Not on file  . Marital Status: Not on file   Past Surgical History:  Procedure Laterality Date  . bilateral tubes placed in ears  04/12/2012  . CARPAL TUNNEL RELEASE  12/15/2011   Procedure: CARPAL TUNNEL RELEASE;  Surgeon: [name], MD;  Location: [facility];  Service: Orthopedics;  Laterality: Left;  [name] MULTIPLE TOOTH EXTRACTIONS    . SHOULDER SURGERY  2012   lt  . ULNAR TUNNEL RELEASE  12/15/2011   Procedure: CUBITAL TUNNEL RELEASE;  Surgeon: [name], MD;  Location: [facility];  Service: Orthopedics;  Laterality: Left;  left cubital tunnel release   Past Medical History:  Diagnosis Date  . Allergy   . Chronic pain due to trauma   . Disturbance of skin sensation   . Intercostal neuralgia   . Lumbosacral neuritis   . Myocardial infarction (HCC)   . Rotator cuff (capsule) sprain   . Snores    BP 131/89   Pulse 79   Temp 98.4 F (36.9 C) (Oral)   Ht  5\' 9"  (1.753 m)   Wt 254 lb (115.2 kg)   SpO2 92%   BMI 37.51 kg/m   Opioid Risk Score:   Fall Risk Score:  `1  Depression screen PHQ 2/9  Depression screen [facility] 2/9 12/16/2018 11/10/2018 10/19/2018 04/18/2018 12/23/2017 11/19/2017 08/23/2017  Decreased Interest 0 0 0 0 0 0 0  Down, Depressed, Hopeless 0 0 0 0 0 0 0  PHQ - 2 Score 0 0 0 0 0 0 0  Altered sleeping - - - - - - -  Tired, decreased energy - - - - - - -  Feeling bad or failure about yourself  - - - - - - -  Trouble concentrating - - - - - - -  Moving slowly or fidgety/restless - - - - - - -  Suicidal thoughts - - - - - - -  PHQ-9 Score - - - - - - -   Review of Systems  Musculoskeletal: Positive for gait problem.  Neurological: Positive for tremors, weakness and numbness.  All other systems reviewed and are negative.      Objective:   Physical Exam Vitals and nursing note reviewed.  Constitutional:      Appearance: Normal appearance. He is obese.  Cardiovascular:     Rate and Rhythm: Normal rate and regular rhythm.     Pulses: Normal pulses.     Heart sounds: Normal heart sounds.  Pulmonary:     Effort: Pulmonary effort is normal.     Breath sounds: Normal breath sounds.  Musculoskeletal:     Cervical back: Normal range of motion.     Comments: Normal Muscle Bulk and Muscle Testing Reveals:  Upper Extremities: Right: Full ROM and Muscle Strength 5/5 Left: Decreased ROM 45 Degrees and Muscle Strength 5/5 Thoracic and Lumbar Hypersensitivity Right Greater Trochanter Tenderness Lower Extremities:Right:  Decreased ROM and Muscle Strength 4/5  Right Lower Extremity Flexion Produces Pain into his Right Lower Extremity, Right Hip and Lower Back Left: Full ROM and Muscle Strength 5/5 Arises from chair Slowly using cane for support Antalgic  Gait   Skin:    General: Skin is warm and dry.  Neurological:     Mental Status: He is alert and oriented to person, place, and time.  Psychiatric:        Mood and Affect: Mood  normal.        Behavior: Behavior normal.           Assessment & Plan:  1. 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 2. Lumbar Radiculitis: Continuecurrent medication regimen withGabapentin.08/17/2019 3. Intercostal neuralgia: Unable to continue Flector Patch due to cost, Continuecurrent medication regimen withVoltaren Gel,Robaxin and Neurontin.08/17/2019 4. Insomnia: Continuecurrent medication regime withtrazodone.08/17/2019 5. Muscle Spasm: Continuecurrent medication regimen withRobaxin.08/17/2019 6. Polyarthralgia: Continue current medication regime. Continue to Monitor.08/17/2019 7. Chronic Bilateral Thoracic Pain: Continue HEP and Continue Current medication regimen. Continue to monitor.08/17/2019 8. Chronic Right Hip Pain/ Avascular Necrosis:AwaitingAppointment with OrthopedistDr. Dion Saucier S/P Hip Injection by Dr. Sherlyn Lees relief noted.08/17/2019 9. Cervical Lymphadenopathy: Endocrinologist Following. Continue to monitor.07/20/2019  of face to face patient care time was spent during this visit. All questions were encouraged and answered.   F/U in 1 month

## 2019-09-12 ENCOUNTER — Other Ambulatory Visit: Payer: Self-pay

## 2019-09-12 ENCOUNTER — Encounter: Payer: Self-pay | Admitting: Registered Nurse

## 2019-09-12 ENCOUNTER — Encounter: Payer: Medicare HMO | Attending: Physical Medicine & Rehabilitation | Admitting: Registered Nurse

## 2019-09-12 VITALS — BP 133/79 | HR 90 | Temp 98.5°F | Ht 69.0 in | Wt 248.0 lb

## 2019-09-12 DIAGNOSIS — Z5181 Encounter for therapeutic drug level monitoring: Secondary | ICD-10-CM | POA: Diagnosis present

## 2019-09-12 DIAGNOSIS — G894 Chronic pain syndrome: Secondary | ICD-10-CM | POA: Diagnosis present

## 2019-09-12 DIAGNOSIS — M5416 Radiculopathy, lumbar region: Secondary | ICD-10-CM | POA: Diagnosis present

## 2019-09-12 DIAGNOSIS — Z79891 Long term (current) use of opiate analgesic: Secondary | ICD-10-CM

## 2019-09-12 DIAGNOSIS — M25551 Pain in right hip: Secondary | ICD-10-CM | POA: Insufficient documentation

## 2019-09-12 DIAGNOSIS — M519 Unspecified thoracic, thoracolumbar and lumbosacral intervertebral disc disorder: Secondary | ICD-10-CM | POA: Diagnosis not present

## 2019-09-12 DIAGNOSIS — G8921 Chronic pain due to trauma: Secondary | ICD-10-CM

## 2019-09-12 DIAGNOSIS — M4647 Discitis, unspecified, lumbosacral region: Secondary | ICD-10-CM | POA: Insufficient documentation

## 2019-09-12 DIAGNOSIS — F17210 Nicotine dependence, cigarettes, uncomplicated: Secondary | ICD-10-CM | POA: Insufficient documentation

## 2019-09-12 DIAGNOSIS — M25512 Pain in left shoulder: Secondary | ICD-10-CM

## 2019-09-12 DIAGNOSIS — G8929 Other chronic pain: Secondary | ICD-10-CM

## 2019-09-12 DIAGNOSIS — M546 Pain in thoracic spine: Secondary | ICD-10-CM

## 2019-09-12 DIAGNOSIS — M8700 Idiopathic aseptic necrosis of unspecified bone: Secondary | ICD-10-CM

## 2019-09-12 MED ORDER — HYDROCODONE-ACETAMINOPHEN 10-325 MG PO TABS: 1.0000 | ORAL_TABLET | Freq: Four times a day (QID) | ORAL | 0 refills | Status: DC | PRN

## 2019-09-12 NOTE — Progress Notes (Signed)
Subjective:    Patient ID: Matthew Sloan, male    DOB: 04-01-1961, 59 y.o.   MRN: [id number]  HPI: Matthew Sloan is a 59 y.o. male who returns for follow up appointment for chronic pain and medication refill. He states his pain is located in his left shoulder, mid- lower back pain radiating into his right hip, right lower extremity and right foot. He rates his pain 9. His current exercise regime is walking and performing stretching exercises.  [name] Morphine equivalent is 40.00 MME.    Last UDS was Performed on 05/16/2019, it was consistent.   [name] states he's having financial hardship he has to follow up with his endocrinologist and orthopedist. Due to co-pay cost for specialty offices  he's having a financial hardship, he will be allowed to come in two months due to the above. [name] is very appreciative and verbalizes understanding.   [name] reports his brother passed away and emotional support given.    Pain Inventory Average Pain 9 Pain Right Now 9 My pain is sharp, burning, stabbing, tingling and aching  In the last 24 hours, has pain interfered with the following? General activity 10 Relation with others 8 Enjoyment of life 9 What TIME of day is your pain at its worst? all Sleep (in general) Poor  Pain is worse with: walking, bending, sitting, inactivity and standing Pain improves with: medication Relief from Meds: 2  Mobility use a cane  Function disabled: date disabled .  Neuro/Psych weakness numbness tremor tingling trouble walking spasms dizziness  Prior Studies Any changes since last visit?  no  Physicians involved in your care Any changes since last visit?  no   Family History  Problem Relation Age of Onset  . Cancer Mother 47       breast cancer  . Heart disease Father 74       CHF   Social History   Socioeconomic History  . Marital status: Single    Spouse name: Not on file  . Number of children: Not on file  .  Years of education: Not on file  . Highest education level: Not on file  Occupational History  . Not on file  Tobacco Use  . Smoking status: Current Every Day Smoker    Packs/day: 1.00    Years: 30.00    Pack years: 30.00    Types: Cigarettes  . Smokeless tobacco: Never Used  . Tobacco comment: working on it-down to 0.5 ppd  Substance and Sexual Activity  . Alcohol use: No  . Drug use: No  . Sexual activity: Not on file  Other Topics Concern  . Not on file  Social History Narrative  . Not on file   Social Determinants of Health   Financial Resource Strain:   . Difficulty of Paying Living Expenses: Not on file  Food Insecurity:   . Worried About Programme researcher, broadcasting/film/video in the Last Year: Not on file  . Ran Out of Food in the Last Year: Not on file  Transportation Needs:   . Lack of Transportation (Medical): Not on file  . Lack of Transportation (Non-Medical): Not on file  Physical Activity:   . Days of Exercise per Week: Not on file  . Minutes of Exercise per Session: Not on file  Stress:   . Feeling of Stress : Not on file  Social Connections:   . Frequency of Communication with Friends and Family: Not on file  .  Frequency of Social Gatherings with Friends and Family: Not on file  . Attends Religious Services: Not on file  . Active Member of Clubs or Organizations: Not on file  . Attends Banker Meetings: Not on file  . Marital Status: Not on file   Past Surgical History:  Procedure Laterality Date  . bilateral tubes placed in ears  04/12/2012  . CARPAL TUNNEL RELEASE  12/15/2011   Procedure: CARPAL TUNNEL RELEASE;  Surgeon: [name], MD;  Location: [facility];  Service: Orthopedics;  Laterality: Left;  [name] MULTIPLE TOOTH EXTRACTIONS    . SHOULDER SURGERY  2012   lt  . ULNAR TUNNEL RELEASE  12/15/2011   Procedure: CUBITAL TUNNEL RELEASE;  Surgeon: [name], MD;  Location: [facility];  Service: Orthopedics;  Laterality:  Left;  left cubital tunnel release   Past Medical History:  Diagnosis Date  . Allergy   . Chronic pain due to trauma   . Disturbance of skin sensation   . Intercostal neuralgia   . Lumbosacral neuritis   . Myocardial infarction (HCC)   . Rotator cuff (capsule) sprain   . Snores    BP 133/79   Pulse 90   Temp 98.5 F (36.9 C)   Ht 5\' 9"  (1.753 m)   Wt 248 lb (112.5 kg)   SpO2 95%   BMI 36.62 kg/m   Opioid Risk Score:   Fall Risk Score:  `1  Depression screen PHQ 2/9  Depression screen [facility] 2/9 12/16/2018 11/10/2018 10/19/2018 04/18/2018 12/23/2017 11/19/2017 08/23/2017  Decreased Interest 0 0 0 0 0 0 0  Down, Depressed, Hopeless 0 0 0 0 0 0 0  PHQ - 2 Score 0 0 0 0 0 0 0  Altered sleeping - - - - - - -  Tired, decreased energy - - - - - - -  Feeling bad or failure about yourself  - - - - - - -  Trouble concentrating - - - - - - -  Moving slowly or fidgety/restless - - - - - - -  Suicidal thoughts - - - - - - -  PHQ-9 Score - - - - - - -     Review of Systems  Constitutional: Negative.   HENT: Negative.   Eyes: Negative.   Respiratory: Negative.   Cardiovascular: Negative.   Gastrointestinal: Negative.   Endocrine: Negative.   Genitourinary: Negative.   Musculoskeletal: Positive for arthralgias, back pain, gait problem and myalgias.  Skin: Negative.   Allergic/Immunologic: Negative.   Neurological: Positive for dizziness, tremors, weakness and numbness.  Hematological: Negative.   Psychiatric/Behavioral: Negative.   All other systems reviewed and are negative.      Objective:   Physical Exam Vitals and nursing note reviewed.  Constitutional:      Appearance: Normal appearance.  Cardiovascular:     Rate and Rhythm: Normal rate and regular rhythm.     Pulses: Normal pulses.     Heart sounds: Normal heart sounds.  Pulmonary:     Effort: Pulmonary effort is normal.     Breath sounds: Normal breath sounds.  Musculoskeletal:     Cervical back: Normal range of  motion and neck supple.     Comments: Normal Muscle Bulk and Muscle Testing Reveals:  Upper Extremities: Right: Full ROM and Muscle Strength 5/5 Left: Decreased ROM 45 Degrees and Muscle Strength 5/5 Bilateral AC Joint Tenderness: L>R Lumbar Paraspinal Tenderness: L-3-L-5 Lower Extremities: Right: Decreased ROM  and Muscle Strength 5/5 Right Lower Extremity Flexion Produces Pain into his Lower Back, Right Hip and Right Lower Extremity Arises from Chair slowly using cane for support Antalgic  Gait   Skin:    General: Skin is warm and dry.  Neurological:     Mental Status: He is alert and oriented to person, place, and time.  Psychiatric:        Mood and Affect: Mood normal.        Behavior: Behavior normal.           Assessment & Plan:  1. 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 09/12/2019 We will continue the opioid monitoring program, this consists of regular clinic visits, examinations, urine drug screen, pill counts as well as use of [place] Controlled Substance reporting System. 2. Lumbar Radiculitis: Continuecurrent medication regimen withGabapentin.09/12/2019 3. Intercostal neuralgia: Unable to continue Flector Patch due to cost, Continuecurrent medication regimen withVoltaren Gel,Robaxin and Neurontin.09/12/2019 4. Insomnia: Continuecurrent medication regime withtrazodone.09/12/2019 5. Muscle Spasm: Continuecurrent medication regimen withRobaxin.09/12/2019 6. Polyarthralgia: Continue current medication regime. Continue to Monitor.09/12/2019 7. Chronic Bilateral Thoracic Pain: Continue HEP and Continue Current medication regimen. Continue to monitor.09/12/2019 8. Chronic Right Hip Pain/ Avascular  Necrosis:AwaitingAppointment with OrthopedistDr. Mardelle Matte S/P Hip Injection by Dr. Troy Sine relief noted.09/12/2019 9. Cervical Lymphadenopathy: Endocrinologist Following. Continue to monitor.09/12/2019  79minutes of face to face patient care time was spent during this visit. All questions were encouraged and answered.   F/U in 2 months

## 2019-09-29 ENCOUNTER — Other Ambulatory Visit: Payer: Self-pay | Admitting: "Endocrinology

## 2019-11-13 ENCOUNTER — Encounter: Payer: Medicare HMO | Attending: Physical Medicine & Rehabilitation | Admitting: Registered Nurse

## 2019-11-13 ENCOUNTER — Other Ambulatory Visit: Payer: Self-pay

## 2019-11-13 ENCOUNTER — Encounter: Payer: Self-pay | Admitting: Registered Nurse

## 2019-11-13 VITALS — BP 136/78 | HR 96 | Temp 97.6°F | Ht 69.0 in | Wt 243.0 lb

## 2019-11-13 DIAGNOSIS — M2550 Pain in unspecified joint: Secondary | ICD-10-CM

## 2019-11-13 DIAGNOSIS — G8921 Chronic pain due to trauma: Secondary | ICD-10-CM | POA: Diagnosis not present

## 2019-11-13 DIAGNOSIS — Z79891 Long term (current) use of opiate analgesic: Secondary | ICD-10-CM

## 2019-11-13 DIAGNOSIS — F17210 Nicotine dependence, cigarettes, uncomplicated: Secondary | ICD-10-CM | POA: Diagnosis not present

## 2019-11-13 DIAGNOSIS — M5416 Radiculopathy, lumbar region: Secondary | ICD-10-CM | POA: Diagnosis present

## 2019-11-13 DIAGNOSIS — G8929 Other chronic pain: Secondary | ICD-10-CM | POA: Diagnosis present

## 2019-11-13 DIAGNOSIS — M25551 Pain in right hip: Secondary | ICD-10-CM | POA: Diagnosis present

## 2019-11-13 DIAGNOSIS — M4647 Discitis, unspecified, lumbosacral region: Secondary | ICD-10-CM | POA: Insufficient documentation

## 2019-11-13 DIAGNOSIS — Z5181 Encounter for therapeutic drug level monitoring: Secondary | ICD-10-CM | POA: Diagnosis present

## 2019-11-13 DIAGNOSIS — G894 Chronic pain syndrome: Secondary | ICD-10-CM

## 2019-11-13 DIAGNOSIS — M519 Unspecified thoracic, thoracolumbar and lumbosacral intervertebral disc disorder: Secondary | ICD-10-CM

## 2019-11-13 DIAGNOSIS — M8700 Idiopathic aseptic necrosis of unspecified bone: Secondary | ICD-10-CM

## 2019-11-13 DIAGNOSIS — M546 Pain in thoracic spine: Secondary | ICD-10-CM

## 2019-11-13 DIAGNOSIS — M25512 Pain in left shoulder: Secondary | ICD-10-CM

## 2019-11-13 MED ORDER — HYDROCODONE-ACETAMINOPHEN 10-325 MG PO TABS: 1.0000 | ORAL_TABLET | Freq: Four times a day (QID) | ORAL | 0 refills | Status: DC | PRN

## 2019-11-13 NOTE — Progress Notes (Signed)
Subjective:    Patient ID: Matthew Sloan, male    DOB: April 29, 1961, 59 y.o.   MRN: [id number]  HPI: Matthew Sloan is a 59 y.o. male who returns for follow up appointment for chronic pain and medication refill. He states his pain is located in his left shoulder, mid- lower back pain radiating into his right hip, right lower extremity and right foot pain. Also reports generalized joint pain. He rates his pain 9. His current exercise regime is walking and performing stretching exercises with bands,   Matthew Sloan Morphine equivalent is 40.00  MME.  UDS ordered today.   Pain Inventory Average Pain 10 Pain Right Now 9 My pain is sharp, burning, stabbing, tingling and aching  In the last 24 hours, has pain interfered with the following? General activity 10 Relation with others 8 Enjoyment of life 8 What TIME of day is your pain at its worst? all Sleep (in general) Poor  Pain is worse with: walking, bending, sitting, inactivity and standing Pain improves with: medication Relief from Meds: 2  Mobility walk with assistance use a cane how many minutes can you walk? 0 ability to climb steps?  no do you drive?  yes  Function disabled: date disabled .  Neuro/Psych weakness numbness tremor tingling trouble walking spasms  Prior Studies Any changes since last visit?  no  Physicians involved in your care Any changes since last visit?  no   Family History  Problem Relation Age of Onset  . Cancer Mother 39       breast cancer  . Heart disease Father 84       CHF   Social History   Socioeconomic History  . Marital status: Single    Spouse name: Not on file  . Number of children: Not on file  . Years of education: Not on file  . Highest education level: Not on file  Occupational History  . Not on file  Tobacco Use  . Smoking status: Current Every Day Smoker    Packs/day: 1.00    Years: 30.00    Pack years: 30.00    Types: Cigarettes  . Smokeless tobacco: Never Used   . Tobacco comment: working on it-down to 0.5 ppd  Substance and Sexual Activity  . Alcohol use: No  . Drug use: No  . Sexual activity: Not on file  Other Topics Concern  . Not on file  Social History Narrative  . Not on file   Social Determinants of Health   Financial Resource Strain:   . Difficulty of Paying Living Expenses:   Food Insecurity:   . Worried About Programme researcher, broadcasting/film/video in the Last Year:   . Barista in the Last Year:   Transportation Needs:   . Freight forwarder (Medical):   [name] Lack of Transportation (Non-Medical):   Physical Activity:   . Days of Exercise per Week:   . Minutes of Exercise per Session:   Stress:   . Feeling of Stress :   Social Connections:   . Frequency of Communication with Friends and Family:   . Frequency of Social Gatherings with Friends and Family:   . Attends Religious Services:   . Active Member of Clubs or Organizations:   . Attends Banker Meetings:   [name] Marital Status:    Past Surgical History:  Procedure Laterality Date  . bilateral tubes placed in ears  04/12/2012  . CARPAL TUNNEL RELEASE  12/15/2011  Procedure: CARPAL TUNNEL RELEASE;  Surgeon: [name], MD;  Location: [facility];  Service: Orthopedics;  Laterality: Left;  [name] MULTIPLE TOOTH EXTRACTIONS    . SHOULDER SURGERY  2012   lt  . ULNAR TUNNEL RELEASE  12/15/2011   Procedure: CUBITAL TUNNEL RELEASE;  Surgeon: [name], MD;  Location: [facility];  Service: Orthopedics;  Laterality: Left;  left cubital tunnel release   Past Medical History:  Diagnosis Date  . Allergy   . Chronic pain due to trauma   . Disturbance of skin sensation   . Intercostal neuralgia   . Lumbosacral neuritis   . Myocardial infarction (HCC)   . Rotator cuff (capsule) sprain   . Snores    BP 136/78   Pulse 96   Temp 97.6 F (36.4 C)   Ht 5\' 9"  (1.753 m)   Wt 243 lb (110.2 kg)   SpO2 95%   BMI 35.88 kg/m   Opioid Risk Score:     Fall Risk Score:  `1  Depression screen PHQ 2/9  Depression screen [facility] 2/9 12/16/2018 11/10/2018 10/19/2018 04/18/2018 12/23/2017 11/19/2017 08/23/2017  Decreased Interest 0 0 0 0 0 0 0  Down, Depressed, Hopeless 0 0 0 0 0 0 0  PHQ - 2 Score 0 0 0 0 0 0 0  Altered sleeping - - - - - - -  Tired, decreased energy - - - - - - -  Feeling bad or failure about yourself  - - - - - - -  Trouble concentrating - - - - - - -  Moving slowly or fidgety/restless - - - - - - -  Suicidal thoughts - - - - - - -  PHQ-9 Score - - - - - - -    Review of Systems  Musculoskeletal: Positive for gait problem.  Neurological: Positive for tremors, weakness and numbness.       Tingling spasms  All other systems reviewed and are negative.      Objective:   Physical Exam Vitals and nursing note reviewed.  Constitutional:      Appearance: Normal appearance.  Cardiovascular:     Rate and Rhythm: Normal rate and regular rhythm.     Pulses: Normal pulses.     Heart sounds: Normal heart sounds.  Pulmonary:     Effort: Pulmonary effort is normal.     Breath sounds: Normal breath sounds.  Musculoskeletal:     Cervical back: Normal range of motion and neck supple.     Comments: Normal Muscle Bulk and Muscle Testing Reveals:  Upper Extremities: Right: Full ROM and Muscle Strength 5/5 Left: Decreased ROM 45 Degrees and Muscle Strength 4/5 Left AC Joint Tenderness  Thoracic Paraspinal Tenderness: T-1-T-3 Mainly Left Side Thoracic Paraspinal Tenderness: T-7-T-9  Lumbar Hypersensitivity Lower Extremities: Right: Decreased ROM and Muscle Strength 4/5 Left Lower Extremity: Full ROM and Muscle Strength 5/5 Right Greater Trochanter Tenderness Arises from Table Slowly using cane for support Antalgic  Gait    Skin:    General: Skin is warm and dry.  Neurological:     Mental Status: He is alert and oriented to person, place, and time.  Psychiatric:        Mood and Affect: Mood normal.        Behavior: Behavior  normal.           Assessment & Plan:  1. Chronic pain due to trauma with chronic back pain due to transverse  and spinous process fractures: Continue Exercise regimen.Continue current medication regimen.11/13/2019 Refilled:Hydrocodone 10/325 mg #120 pills--use every 6 hours as needed.#120.Second script sent for the following month. 11/13/2019 We will continue the opioid monitoring program, this consists of regular clinic visits, examinations, urine drug screen, pill counts as well as use of [place] Controlled Substance reporting System. 2. Lumbar Radiculitis: Continuecurrent medication regimen withGabapentin.11/13/2019 3. Intercostal neuralgia: Unable to continue Flector Patch due to cost, Continuecurrent medication regimen withVoltaren Gel,Robaxin and Neurontin.11/13/2019 4. Insomnia: Continuecurrent medication regime withtrazodone.09/12/2019 5. Muscle Spasm: Continuecurrent medication regimen withRobaxin.11/13/2019 6. Polyarthralgia: Continue current medication regime. Continue to Monitor.11/13/2019 7. Chronic Bilateral Thoracic Pain: Continue HEP and Continue Current medication regimen. Continue to monitor.11/13/2019 8. Chronic Right Hip Pain/ Avascular Necrosis:AwaitingAppointment with OrthopedistDr. Mardelle Matte S/P Hip Injection by Dr. Troy Sine relief noted.11/13/2019 9. Cervical Lymphadenopathy: Endocrinologist Following. Continue to monitor.11/13/2019  30minutes of face to face patient care time was spent during this visit. All questions were encouraged and answered.   F/U in 1 month

## 2019-11-15 LAB — TOXASSURE SELECT,+ANTIDEPR,UR

## 2019-11-24 ENCOUNTER — Telehealth: Payer: Self-pay | Admitting: *Deleted

## 2019-11-24 NOTE — Telephone Encounter (Signed)
Urine drug screen for this encounter is consistent for prescribed medication 

## 2019-12-15 ENCOUNTER — Encounter: Payer: Self-pay | Admitting: Registered Nurse

## 2019-12-15 ENCOUNTER — Other Ambulatory Visit: Payer: Self-pay

## 2019-12-15 ENCOUNTER — Other Ambulatory Visit: Payer: Self-pay | Admitting: Orthopedic Surgery

## 2019-12-15 ENCOUNTER — Encounter: Payer: Medicare HMO | Attending: Physical Medicine & Rehabilitation | Admitting: Registered Nurse

## 2019-12-15 VITALS — BP 151/73 | HR 104 | Temp 97.3°F | Ht 69.0 in | Wt 243.2 lb

## 2019-12-15 DIAGNOSIS — G894 Chronic pain syndrome: Secondary | ICD-10-CM | POA: Diagnosis not present

## 2019-12-15 DIAGNOSIS — M5416 Radiculopathy, lumbar region: Secondary | ICD-10-CM | POA: Diagnosis not present

## 2019-12-15 DIAGNOSIS — G8929 Other chronic pain: Secondary | ICD-10-CM | POA: Diagnosis present

## 2019-12-15 DIAGNOSIS — M519 Unspecified thoracic, thoracolumbar and lumbosacral intervertebral disc disorder: Secondary | ICD-10-CM

## 2019-12-15 DIAGNOSIS — Z5181 Encounter for therapeutic drug level monitoring: Secondary | ICD-10-CM | POA: Insufficient documentation

## 2019-12-15 DIAGNOSIS — Z79891 Long term (current) use of opiate analgesic: Secondary | ICD-10-CM | POA: Insufficient documentation

## 2019-12-15 DIAGNOSIS — M4647 Discitis, unspecified, lumbosacral region: Secondary | ICD-10-CM | POA: Insufficient documentation

## 2019-12-15 DIAGNOSIS — M546 Pain in thoracic spine: Secondary | ICD-10-CM

## 2019-12-15 DIAGNOSIS — M25551 Pain in right hip: Secondary | ICD-10-CM

## 2019-12-15 DIAGNOSIS — M8700 Idiopathic aseptic necrosis of unspecified bone: Secondary | ICD-10-CM

## 2019-12-15 DIAGNOSIS — G8921 Chronic pain due to trauma: Secondary | ICD-10-CM | POA: Diagnosis not present

## 2019-12-15 DIAGNOSIS — M2550 Pain in unspecified joint: Secondary | ICD-10-CM

## 2019-12-15 DIAGNOSIS — F17210 Nicotine dependence, cigarettes, uncomplicated: Secondary | ICD-10-CM | POA: Insufficient documentation

## 2019-12-15 MED ORDER — HYDROCODONE-ACETAMINOPHEN 10-325 MG PO TABS: 1.0000 | ORAL_TABLET | Freq: Four times a day (QID) | ORAL | 0 refills | Status: DC | PRN

## 2019-12-15 NOTE — Progress Notes (Signed)
Subjective:    Patient ID: Matthew Sloan, male    DOB: 30-Sep-1960, 59 y.o.   MRN: [id number]  HPI: Matthew Sloan is a 59 y.o. male who returns for follow up appointment for chronic pain and medication refill. He states his pain is located in his mid- lower back pain radiating into his right hip and right lower extremity. Also reports generalized joint pain. He rates his pain 9. His current exercise regime is walking and performing stretching exercises.  [name] Morphine equivalent is 40.00  MME.  Last UDS was Performed on 11/13/2019, it was consistent.   Pain Inventory Average Pain 9 Pain Right Now 9 My pain is sharp, burning, stabbing, tingling and aching  In the last 24 hours, has pain interfered with the following? General activity 10 Relation with others 8 Enjoyment of life 8 What TIME of day is your pain at its worst? all Sleep (in general) Poor  Pain is worse with: walking, bending, sitting, inactivity, standing and some activites Pain improves with: medication Relief from Meds: 2  Mobility use a cane  Function disabled: date disabled .  Neuro/Psych weakness numbness tremor tingling trouble walking spasms  Prior Studies Any changes since last visit?  no  Physicians involved in your care Any changes since last visit?  no   Family History  Problem Relation Age of Onset  . Cancer Mother 24       breast cancer  . Heart disease Father 53       CHF   Social History   Socioeconomic History  . Marital status: Single    Spouse name: Not on file  . Number of children: Not on file  . Years of education: Not on file  . Highest education level: Not on file  Occupational History  . Not on file  Tobacco Use  . Smoking status: Current Every Day Smoker    Packs/day: 1.00    Years: 30.00    Pack years: 30.00    Types: Cigarettes  . Smokeless tobacco: Never Used  . Tobacco comment: working on it-down to 0.5 ppd  Substance and Sexual Activity  . Alcohol  use: No  . Drug use: No  . Sexual activity: Not on file  Other Topics Concern  . Not on file  Social History Narrative  . Not on file   Social Determinants of Health   Financial Resource Strain:   . Difficulty of Paying Living Expenses:   Food Insecurity:   . Worried About Programme researcher, broadcasting/film/video in the Last Year:   . Barista in the Last Year:   Transportation Needs:   . Freight forwarder (Medical):   [name] Lack of Transportation (Non-Medical):   Physical Activity:   . Days of Exercise per Week:   . Minutes of Exercise per Session:   Stress:   . Feeling of Stress :   Social Connections:   . Frequency of Communication with Friends and Family:   . Frequency of Social Gatherings with Friends and Family:   . Attends Religious Services:   . Active Member of Clubs or Organizations:   . Attends Banker Meetings:   [name] Marital Status:    Past Surgical History:  Procedure Laterality Date  . bilateral tubes placed in ears  04/12/2012  . CARPAL TUNNEL RELEASE  12/15/2011   Procedure: CARPAL TUNNEL RELEASE;  Surgeon: [name], MD;  Location: [facility];  Service: Orthopedics;  Laterality: Left;  [name] MULTIPLE TOOTH EXTRACTIONS    . SHOULDER SURGERY  2012   lt  . ULNAR TUNNEL RELEASE  12/15/2011   Procedure: CUBITAL TUNNEL RELEASE;  Surgeon: Tennis Must, MD;  Location: [facility];  Service: Orthopedics;  Laterality: Left;  left cubital tunnel release   Past Medical History:  Diagnosis Date  . Allergy   . Chronic pain due to trauma   . Disturbance of skin sensation   . Intercostal neuralgia   . Lumbosacral neuritis   . Myocardial infarction ([place])   . Rotator cuff (capsule) sprain   . Snores    BP (!) 151/73   Pulse (!) 104   Temp (!) 97.3 F (36.3 C)   Ht 5\' 9"  (1.753 m)   Wt 243 lb 3.2 oz (110.3 kg)   SpO2 94%   BMI 35.91 kg/m   Opioid Risk Score:   Fall Risk Score:  `1  Depression screen PHQ 2/9  Depression screen  [facility] 2/9 12/15/2019 12/16/2018 11/10/2018 10/19/2018 04/18/2018 12/23/2017 11/19/2017  Decreased Interest 0 0 0 0 0 0 0  Down, Depressed, Hopeless 0 0 0 0 0 0 0  PHQ - 2 Score 0 0 0 0 0 0 0  Altered sleeping - - - - - - -  Tired, decreased energy - - - - - - -  Feeling bad or failure about yourself  - - - - - - -  Trouble concentrating - - - - - - -  Moving slowly or fidgety/restless - - - - - - -  Suicidal thoughts - - - - - - -  PHQ-9 Score - - - - - - -    Review of Systems  Constitutional: Negative.   HENT: Negative.   Eyes: Negative.   Respiratory: Negative.   Cardiovascular: Negative.   Gastrointestinal: Negative.   Endocrine: Negative.   Genitourinary: Negative.   Musculoskeletal:       Spasms  Skin: Negative.   Neurological: Positive for tremors and numbness.       Tingling  Hematological: Negative.   Psychiatric/Behavioral: Negative.   All other systems reviewed and are negative.      Objective:   Physical Exam Vitals and nursing note reviewed.  Constitutional:      Appearance: Normal appearance.  Cardiovascular:     Rate and Rhythm: Normal rate and regular rhythm.     Pulses: Normal pulses.     Heart sounds: Normal heart sounds.  Pulmonary:     Effort: Pulmonary effort is normal.     Breath sounds: Normal breath sounds.  Musculoskeletal:     Cervical back: Normal range of motion and neck supple.     Comments: Normal Muscle Bulk and Muscle Testing Reveals:  Upper Extremities: Right Full ROM and Muscle Strength 5/5 Left Upper Extremity: Decreased ROM 45 Degrees and Muscle Strength 45 Degrees  Thoracic Paraspinal Tenderness: T-7-T-9 Lumbar Hypersensitivity Lower Extremities: Right: Decreased ROM and Muscle Strength 5/5 Right Lower Extremity Flexion Produces Pain into his Right Groin, Right Hip and Lower Back Left Lower Extremity: Full ROM and Muscle Strength 5/5 Left Lower Extremity Flexion Produces Pain into his Left Hip and Left Lower Extremity Arises from chair  slowly using cane for support Antalgic Gait  Skin:    General: Skin is warm and dry.  Neurological:     Mental Status: He is alert and oriented to person, place, and time.  Psychiatric:  Mood and Affect: Mood normal.        Behavior: Behavior normal.           Assessment & Plan:  1. 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 2. Lumbar Radiculitis: Continuecurrent medication regimen withGabapentin.12/15/2019 3. Intercostal neuralgia: Unable to continue Flector Patch due to cost, Continuecurrent medication regimen withVoltaren Gel,Robaxin and Neurontin.12/15/2019 4. Insomnia: Continuecurrent medication regime withtrazodone.12/15/2019 5. Muscle Spasm: Continuecurrent medication regimen withRobaxin.12/15/2019 6. Polyarthralgia: Continue current medication regime. Continue to Monitor.12/15/2019 7. Chronic Bilateral Thoracic Pain: Continue HEP and Continue Current medication regimen. Continue to monitor.12/15/2019 8. Chronic Right Hip Pain/ Avascular Necrosis:Mr. Runco states he has an Astronomer with OrthopedistDr. Dion Saucier today. Awaiting a surgery date. / S/P Hip Injection by Dr. Sherlyn Lees relief noted.12/15/2019 9. Cervical Lymphadenopathy: Endocrinologist Following. Continue to monitor.12/15/2019  of face to face patient care time was spent during this visit. All questions were encouraged and answered.   F/U in61month

## 2019-12-18 ENCOUNTER — Ambulatory Visit
Admission: RE | Admit: 2019-12-18 | Discharge: 2019-12-18 | Disposition: A | Discharge status: Home / Self Care | Payer: Medicare HMO | Source: Ambulatory Visit | Attending: Orthopedic Surgery | Admitting: Orthopedic Surgery

## 2019-12-18 ENCOUNTER — Other Ambulatory Visit: Payer: Self-pay

## 2019-12-18 DIAGNOSIS — M25551 Pain in right hip: Secondary | ICD-10-CM

## 2019-12-21 ENCOUNTER — Other Ambulatory Visit: Payer: Self-pay | Admitting: "Endocrinology

## 2020-01-12 ENCOUNTER — Encounter: Payer: Self-pay | Admitting: Registered Nurse

## 2020-01-12 ENCOUNTER — Other Ambulatory Visit: Payer: Self-pay

## 2020-01-12 ENCOUNTER — Encounter: Payer: Medicare HMO | Attending: Physical Medicine & Rehabilitation | Admitting: Registered Nurse

## 2020-01-12 DIAGNOSIS — G8921 Chronic pain due to trauma: Secondary | ICD-10-CM | POA: Insufficient documentation

## 2020-01-12 DIAGNOSIS — F17210 Nicotine dependence, cigarettes, uncomplicated: Secondary | ICD-10-CM | POA: Diagnosis not present

## 2020-01-12 DIAGNOSIS — M25512 Pain in left shoulder: Secondary | ICD-10-CM

## 2020-01-12 DIAGNOSIS — Z79891 Long term (current) use of opiate analgesic: Secondary | ICD-10-CM | POA: Diagnosis present

## 2020-01-12 DIAGNOSIS — G894 Chronic pain syndrome: Secondary | ICD-10-CM | POA: Diagnosis present

## 2020-01-12 DIAGNOSIS — M4647 Discitis, unspecified, lumbosacral region: Secondary | ICD-10-CM | POA: Insufficient documentation

## 2020-01-12 DIAGNOSIS — M25551 Pain in right hip: Secondary | ICD-10-CM | POA: Insufficient documentation

## 2020-01-12 DIAGNOSIS — M2550 Pain in unspecified joint: Secondary | ICD-10-CM

## 2020-01-12 DIAGNOSIS — M546 Pain in thoracic spine: Secondary | ICD-10-CM

## 2020-01-12 DIAGNOSIS — G8929 Other chronic pain: Secondary | ICD-10-CM

## 2020-01-12 DIAGNOSIS — M5416 Radiculopathy, lumbar region: Secondary | ICD-10-CM | POA: Diagnosis not present

## 2020-01-12 DIAGNOSIS — M8700 Idiopathic aseptic necrosis of unspecified bone: Secondary | ICD-10-CM

## 2020-01-12 DIAGNOSIS — Z5181 Encounter for therapeutic drug level monitoring: Secondary | ICD-10-CM | POA: Diagnosis present

## 2020-01-12 DIAGNOSIS — M519 Unspecified thoracic, thoracolumbar and lumbosacral intervertebral disc disorder: Secondary | ICD-10-CM | POA: Diagnosis not present

## 2020-01-12 MED ORDER — HYDROCODONE-ACETAMINOPHEN 10-325 MG PO TABS: 1.0000 | ORAL_TABLET | Freq: Every day | ORAL | 0 refills | Status: DC | PRN

## 2020-01-12 NOTE — Progress Notes (Signed)
Subjective:    Patient ID: Matthew Sloan, male    DOB: 09/18/1960, 59 y.o.   MRN: [id number]  HPI: Matthew Sloan is a 59 y.o. male whose appointment was changed to a tele-health visit, he called the office reporting increase intensity of right hip pain over the last 5-6 days, [name] is following. He is awaiting surgical date for hip replacement he reports. He's only receiving about 2 hours of relief of his pain with his current medication regimen, we will increase his hydrocodone tablets today, he verbalizes understanding. He was instructed to call office with surgical date, he verbalizes understanding.   Also states his pain is located in his left shoulder, middle- lower back pain radiating into his right hip, right lower extremity and right foot pain. Also reports generalized joint pain. He rates his pain 9. He's not able to follow his current exercise regimen due to increase intensity of right hip pain.   Matthew Sloan Morphine equivalent is 40.00  MME. Last UDS was Performed on 11/13/2019, it was consistent.    Pain Inventory Average Pain 9 Pain Right Now 9 My pain is constant, sharp, burning, dull, stabbing, tingling and aching  In the last 24 hours, has pain interfered with the following? General activity 8 Relation with others 8 Enjoyment of life 8 What TIME of day is your pain at its worst? all Sleep (in general) Poor  Pain is worse with: walking, bending, sitting, inactivity, standing and some activites Pain improves with: rest and medication Relief from Meds: 2  Mobility use a cane do you drive?  yes  Function disabled: date disabled . I need assistance with the following:  meal prep, household duties and shopping  Neuro/Psych trouble walking spasms  Prior Studies Any changes since last visit?  no  Physicians involved in your care Any changes since last visit?  no Orthopedist [place]   Family History  Problem Relation Age of Onset  . Cancer Mother 58        breast cancer  . Heart disease Father 40       CHF   Social History   Socioeconomic History  . Marital status: Single    Spouse name: Not on file  . Number of children: Not on file  . Years of education: Not on file  . Highest education level: Not on file  Occupational History  . Not on file  Tobacco Use  . Smoking status: Current Every Day Smoker    Packs/day: 1.00    Years: 30.00    Pack years: 30.00    Types: Cigarettes  . Smokeless tobacco: Never Used  . Tobacco comment: working on it-down to 0.5 ppd  Vaping Use  . Vaping Use: Never used  Substance and Sexual Activity  . Alcohol use: No  . Drug use: No  . Sexual activity: Not on file  Other Topics Concern  . Not on file  Social History Narrative  . Not on file   Social Determinants of Health   Financial Resource Strain:   . Difficulty of Paying Living Expenses:   Food Insecurity:   . Worried About Programme researcher, broadcasting/film/video in the Last Year:   . Barista in the Last Year:   Transportation Needs:   . Freight forwarder (Medical):   [name] Lack of Transportation (Non-Medical):   Physical Activity:   . Days of Exercise per Week:   . Minutes of Exercise per Session:   Stress:   .  Feeling of Stress :   Social Connections:   . Frequency of Communication with Friends and Family:   . Frequency of Social Gatherings with Friends and Family:   . Attends Religious Services:   . Active Member of Clubs or Organizations:   . Attends Banker Meetings:   [name] Marital Status:    Past Surgical History:  Procedure Laterality Date  . bilateral tubes placed in ears  04/12/2012  . CARPAL TUNNEL RELEASE  12/15/2011   Procedure: CARPAL TUNNEL RELEASE;  Surgeon: [name], MD;  Location: [facility];  Service: Orthopedics;  Laterality: Left;  [name] MULTIPLE TOOTH EXTRACTIONS    . SHOULDER SURGERY  2012   lt  . ULNAR TUNNEL RELEASE  12/15/2011   Procedure: CUBITAL TUNNEL RELEASE;  Surgeon: [name],  MD;  Location: [facility];  Service: Orthopedics;  Laterality: Left;  left cubital tunnel release   Past Medical History:  Diagnosis Date  . Allergy   . Chronic pain due to trauma   . Disturbance of skin sensation   . Intercostal neuralgia   . Lumbosacral neuritis   . Myocardial infarction ([facility])   . Rotator cuff (capsule) sprain   . Snores    There were no vitals taken for this visit.  Opioid Risk Score:   Fall Risk Score:  `1  Depression screen PHQ 2/9  Depression screen [facility] 2/9 01/12/2020 12/15/2019 12/16/2018 11/10/2018 10/19/2018 04/18/2018 12/23/2017  Decreased Interest 0 0 0 0 0 0 0  Down, Depressed, Hopeless 0 0 0 0 0 0 0  PHQ - 2 Score 0 0 0 0 0 0 0  Altered sleeping - - - - - - -  Tired, decreased energy - - - - - - -  Feeling bad or failure about yourself  - - - - - - -  Trouble concentrating - - - - - - -  Moving slowly or fidgety/restless - - - - - - -  Suicidal thoughts - - - - - - -  PHQ-9 Score - - - - - - -    Review of Systems  Constitutional: Negative.   HENT: Negative.   Eyes: Negative.   Respiratory: Negative.   Cardiovascular: Negative.   Gastrointestinal: Negative.   Musculoskeletal: Positive for arthralgias, back pain and gait problem.       Hip pain  Allergic/Immunologic: Negative.   Neurological: Positive for tremors, weakness and numbness.  Hematological:       Eliquis  Psychiatric/Behavioral: Negative.   All other systems reviewed and are negative.      Objective:   Physical Exam Vitals and nursing note reviewed.  Musculoskeletal:     Comments: No Physical Exam Performed- Tele-Health Visit           Assessment & Plan:  1. 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 2. Lumbar Radiculitis: Continuecurrent medication regimen withGabapentin.01/12/2020 3. Intercostal neuralgia: Unable to continue Flector Patch due to cost, Continuecurrent medication regimen withVoltaren Gel,Robaxin and Neurontin.01/12/2020 4. Insomnia: Continuecurrent medication regime withtrazodone.01/12/2020 5. Muscle Spasm: Continuecurrent medication regimen withRobaxin.01/12/2020 6. Polyarthralgia: Continue current medication regime.  Continue to Monitor.01/12/2020 7. Chronic Bilateral Thoracic Pain: Continue HEP and Continue Current medication regimen. Continue to monitor.01/12/2020 8. Chronic Right Hip Pain/ Avascular Necrosis:Matthew Sloan states he has an Astronomer with OrthopedistDr. Dion Saucier today. Awaiting a surgery date. / S/P Hip Injection by Dr. Sherlyn Lees relief noted.01/12/2020 9. Cervical Lymphadenopathy: Endocrinologist Following. Continue to monitor.01/12/2020  F/U in14month  Tele-Health Visit Telephone Call Established Patient Location of Patient: In His Home Location of Provider: In the Office Total Time Spent: 10 Minutes

## 2020-01-31 ENCOUNTER — Other Ambulatory Visit: Payer: Self-pay | Admitting: "Endocrinology

## 2020-02-12 ENCOUNTER — Encounter: Payer: Medicare HMO | Attending: Physical Medicine & Rehabilitation | Admitting: Registered Nurse

## 2020-02-12 ENCOUNTER — Encounter: Payer: Self-pay | Admitting: Registered Nurse

## 2020-02-12 ENCOUNTER — Other Ambulatory Visit: Payer: Self-pay

## 2020-02-12 VITALS — Ht 69.0 in | Wt 232.0 lb

## 2020-02-12 DIAGNOSIS — M546 Pain in thoracic spine: Secondary | ICD-10-CM | POA: Diagnosis not present

## 2020-02-12 DIAGNOSIS — M25551 Pain in right hip: Secondary | ICD-10-CM | POA: Insufficient documentation

## 2020-02-12 DIAGNOSIS — G8921 Chronic pain due to trauma: Secondary | ICD-10-CM | POA: Insufficient documentation

## 2020-02-12 DIAGNOSIS — M8700 Idiopathic aseptic necrosis of unspecified bone: Secondary | ICD-10-CM

## 2020-02-12 DIAGNOSIS — G894 Chronic pain syndrome: Secondary | ICD-10-CM

## 2020-02-12 DIAGNOSIS — M5416 Radiculopathy, lumbar region: Secondary | ICD-10-CM | POA: Insufficient documentation

## 2020-02-12 DIAGNOSIS — G8929 Other chronic pain: Secondary | ICD-10-CM | POA: Insufficient documentation

## 2020-02-12 DIAGNOSIS — M25511 Pain in right shoulder: Secondary | ICD-10-CM | POA: Diagnosis not present

## 2020-02-12 DIAGNOSIS — M2550 Pain in unspecified joint: Secondary | ICD-10-CM

## 2020-02-12 DIAGNOSIS — Z79891 Long term (current) use of opiate analgesic: Secondary | ICD-10-CM | POA: Insufficient documentation

## 2020-02-12 DIAGNOSIS — W19XXXD Unspecified fall, subsequent encounter: Secondary | ICD-10-CM

## 2020-02-12 DIAGNOSIS — M4647 Discitis, unspecified, lumbosacral region: Secondary | ICD-10-CM | POA: Insufficient documentation

## 2020-02-12 DIAGNOSIS — M519 Unspecified thoracic, thoracolumbar and lumbosacral intervertebral disc disorder: Secondary | ICD-10-CM | POA: Diagnosis not present

## 2020-02-12 DIAGNOSIS — Z5181 Encounter for therapeutic drug level monitoring: Secondary | ICD-10-CM

## 2020-02-12 DIAGNOSIS — F17210 Nicotine dependence, cigarettes, uncomplicated: Secondary | ICD-10-CM | POA: Insufficient documentation

## 2020-02-12 DIAGNOSIS — Y92009 Unspecified place in unspecified non-institutional (private) residence as the place of occurrence of the external cause: Secondary | ICD-10-CM

## 2020-02-12 MED ORDER — HYDROCODONE-ACETAMINOPHEN 10-325 MG PO TABS: 1.0000 | ORAL_TABLET | Freq: Every day | ORAL | 0 refills | Status: DC | PRN

## 2020-02-12 NOTE — Progress Notes (Signed)
Subjective:    Patient ID: Matthew Sloan, male    DOB: 04-Jan-1961, 59 y.o.   MRN: [id number]  HPI: Matthew Sloan is a 59 y.o. male whose appointment was changed to a telephone visit per [name] request. He awaken with increase intensity of right hip pain since his fall on 02/11/2020,he reports. He stated he was walking in his kitchen and his right hip gave out and he fell on his right side hitting his right shoulder and right hip. He was able to pick himself up he states. Also reports he called [name] office this am regarding the above, he was also instructed to go to the emergency room if his pain intensifies, he verbalizes understanding. . Also educated on falls prevention, he verbalizes understanding.  He states his pain is located in his right shoulder, upper- lower back pain radiating into his right hip and right lower extremity. Also reports generalized pain all over and joint pain. He rates his pain 8. His current exercise regime is walking with [employer], other wise he states he's staying in bed.   Mr. [employer] Morphine equivalent is 50.00 MME.    Last UDS was Performed on 11/13/2019, it was consistent.    Pain Inventory Average Pain 9 Pain Right Now 8 My pain is constant, sharp, burning, dull, stabbing, tingling and aching  In the last 24 hours, has pain interfered with the following? General activity 9 Relation with others 4 Enjoyment of life 8 What TIME of day is your pain at its worst? All day all night. Sleep (in general) Poor  Pain is worse with: walking, bending, sitting, inactivity, standing and some activites Pain improves with: medication Relief from Meds: 2  Mobility use a cane how many minutes can you walk? 5 10 mins ability to climb steps?  no do you drive?  yes Do you have any goals in this area?  yes  Function disabled: date disabled 2012 I need assistance with the following:  dressing, bathing, toileting, meal prep, household duties and shopping Do  you have any goals in this area?  yes  Neuro/Psych weakness numbness tremor tingling trouble walking spasms  Prior Studies Any changes since last visit?  no  Physicians involved in your care Any changes since last visit?  no   Family History  Problem Relation Age of Onset  . Cancer Mother 54       breast cancer  . Heart disease Father 54       CHF   Social History   Socioeconomic History  . Marital status: Single    Spouse name: Not on file  . Number of children: Not on file  . Years of education: Not on file  . Highest education level: Not on file  Occupational History  . Not on file  Tobacco Use  . Smoking status: Current Every Day Smoker    Packs/day: 1.00    Years: 30.00    Pack years: 30.00    Types: Cigarettes  . Smokeless tobacco: Never Used  . Tobacco comment: working on it-down to 0.5 ppd  Vaping Use  . Vaping Use: Never used  Substance and Sexual Activity  . Alcohol use: No  . Drug use: No  . Sexual activity: Not on file  Other Topics Concern  . Not on file  Social History Narrative  . Not on file   Social Determinants of Health   Financial Resource Strain:   . Difficulty of Paying Living Expenses:  Food Insecurity:   . Worried About Programme researcher, broadcasting/film/video in the Last Year:   . Barista in the Last Year:   Transportation Needs:   . Freight forwarder (Medical):   [name] Lack of Transportation (Non-Medical):   Physical Activity:   . Days of Exercise per Week:   . Minutes of Exercise per Session:   Stress:   . Feeling of Stress :   Social Connections:   . Frequency of Communication with Friends and Family:   . Frequency of Social Gatherings with Friends and Family:   . Attends Religious Services:   . Active Member of Clubs or Organizations:   . Attends Banker Meetings:   [name] Marital Status:    Past Surgical History:  Procedure Laterality Date  . bilateral tubes placed in ears  04/12/2012  . CARPAL TUNNEL RELEASE   12/15/2011   Procedure: CARPAL TUNNEL RELEASE;  Surgeon: [name], MD;  Location: [facility];  Service: Orthopedics;  Laterality: Left;  [name] MULTIPLE TOOTH EXTRACTIONS    . SHOULDER SURGERY  2012   lt  . ULNAR TUNNEL RELEASE  12/15/2011   Procedure: CUBITAL TUNNEL RELEASE;  Surgeon: [name], MD;  [facility];  Service: Orthopedics;  Laterality: Left;  left cubital tunnel release   Past Medical History:  Diagnosis Date  . Allergy   . Chronic pain due to trauma   . Disturbance of skin sensation   . Intercostal neuralgia   . Lumbosacral neuritis   . Myocardial infarction ([facility])   . Rotator cuff (capsule) sprain   . Snores    Ht 5\' 9"  (1.753 m)   Wt (!) 232 lb (105.2 kg) Comment: per pt tele visit  BMI 34.26 kg/m   Opioid Risk Score:   Fall Risk Score:  `1  Depression screen PHQ 2/9  Depression screen [facility] 2/9 01/12/2020 12/15/2019 12/16/2018 11/10/2018 10/19/2018 04/18/2018 12/23/2017  Decreased Interest 0 0 0 0 0 0 0  Down, Depressed, Hopeless 0 0 0 0 0 0 0  PHQ - 2 Score 0 0 0 0 0 0 0  Altered sleeping - - - - - - -  Tired, decreased energy - - - - - - -  Feeling bad or failure about yourself  - - - - - - -  Trouble concentrating - - - - - - -  Moving slowly or fidgety/restless - - - - - - -  Suicidal thoughts - - - - - - -  PHQ-9 Score - - - - - - -   Review of Systems  Constitutional: Negative.   HENT: Negative.   Eyes: Negative.   Respiratory: Negative.   Cardiovascular: Negative.   Gastrointestinal: Negative.   Endocrine: Negative.  Negative for cold intolerance.  Genitourinary: Negative.   Musculoskeletal: Positive for gait problem and myalgias. Negative for joint swelling.  Skin: Negative.   Allergic/Immunologic: Negative.   Neurological: Positive for tremors, weakness and numbness.  Hematological: Negative.   Psychiatric/Behavioral: Negative.        Objective:   Physical Exam Vitals and nursing note reviewed.    Musculoskeletal:     Comments: No Physical Exam Performed: Telephone Visit           Assessment & Plan:  1. 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 2. Lumbar Radiculitis: Continuecurrent medication regimen withGabapentin.02/12/2020 3. Intercostal neuralgia: Unable to continue Flector Patch due to cost, Continuecurrent medication regimen withVoltaren Gel,Robaxin and Neurontin.02/12/2020 4. Insomnia: Continuecurrent medication regime withtrazodone.02/12/2020 5. Muscle Spasm: Continuecurrent medication regimen withRobaxin.02/12/2020 6. Polyarthralgia: Continue current medication regime. Continue to Monitor.02/12/2020 7. Chronic Bilateral Thoracic Pain: Continue HEP and Continue Current medication regimen. Continue to monitor.02/12/2020 8. Chronic Right Hip Pain/ Avascular Necrosis:Mr. Oakland states he has anAppointment with OrthopedistDr. Harlon Flor. Awaiting a surgery date./ S/P Hip Injection by Dr. Sherlyn Lees relief noted.02/12/2020 9. Cervical Lymphadenopathy: Endocrinologist Following. Continue to monitor.02/12/2020 10. Fall at home: Educataed on falls prevention, he verbalizes understanding.  11. Acute Right Shoulder and right hip pain/ S/P Fall. He called [name] office this am he reports and he was instructed to go to the emergency room if his pain intensifies, he verbalizes understanding. I  F/U in9month  Tele-Health Visit Telephone Call Established Patient Location of Patient: In His  Home Location of Provider: In the Office Total Time Spent: 20 Minutes

## 2020-03-13 ENCOUNTER — Other Ambulatory Visit: Payer: Self-pay

## 2020-03-13 ENCOUNTER — Encounter: Payer: Medicare HMO | Attending: Physical Medicine & Rehabilitation | Admitting: Registered Nurse

## 2020-03-13 ENCOUNTER — Encounter: Payer: Self-pay | Admitting: Registered Nurse

## 2020-03-13 VITALS — BP 124/84 | HR 88 | Temp 97.8°F | Ht 69.0 in | Wt 217.0 lb

## 2020-03-13 DIAGNOSIS — G8929 Other chronic pain: Secondary | ICD-10-CM | POA: Insufficient documentation

## 2020-03-13 DIAGNOSIS — M4647 Discitis, unspecified, lumbosacral region: Secondary | ICD-10-CM | POA: Insufficient documentation

## 2020-03-13 DIAGNOSIS — G8921 Chronic pain due to trauma: Secondary | ICD-10-CM | POA: Diagnosis not present

## 2020-03-13 DIAGNOSIS — Z79891 Long term (current) use of opiate analgesic: Secondary | ICD-10-CM | POA: Diagnosis present

## 2020-03-13 DIAGNOSIS — F17210 Nicotine dependence, cigarettes, uncomplicated: Secondary | ICD-10-CM | POA: Diagnosis not present

## 2020-03-13 DIAGNOSIS — M5416 Radiculopathy, lumbar region: Secondary | ICD-10-CM | POA: Diagnosis not present

## 2020-03-13 DIAGNOSIS — M25551 Pain in right hip: Secondary | ICD-10-CM | POA: Insufficient documentation

## 2020-03-13 DIAGNOSIS — W19XXXD Unspecified fall, subsequent encounter: Secondary | ICD-10-CM

## 2020-03-13 DIAGNOSIS — Z5181 Encounter for therapeutic drug level monitoring: Secondary | ICD-10-CM | POA: Diagnosis present

## 2020-03-13 DIAGNOSIS — M546 Pain in thoracic spine: Secondary | ICD-10-CM | POA: Diagnosis not present

## 2020-03-13 DIAGNOSIS — M519 Unspecified thoracic, thoracolumbar and lumbosacral intervertebral disc disorder: Secondary | ICD-10-CM

## 2020-03-13 DIAGNOSIS — M2550 Pain in unspecified joint: Secondary | ICD-10-CM

## 2020-03-13 DIAGNOSIS — Y92009 Unspecified place in unspecified non-institutional (private) residence as the place of occurrence of the external cause: Secondary | ICD-10-CM

## 2020-03-13 DIAGNOSIS — G894 Chronic pain syndrome: Secondary | ICD-10-CM | POA: Diagnosis present

## 2020-03-13 DIAGNOSIS — M8700 Idiopathic aseptic necrosis of unspecified bone: Secondary | ICD-10-CM

## 2020-03-13 MED ORDER — OXYCODONE HCL 10 MG PO TABS: 10.0000 mg | ORAL_TABLET | Freq: Four times a day (QID) | ORAL | 0 refills | Status: DC | PRN

## 2020-03-13 NOTE — Progress Notes (Signed)
Subjective:    Patient ID: Matthew Sloan, male    DOB: September 04, 1960, 59 y.o.   MRN: [id number]  HPI: Matthew Sloan is a 59 y.o. male who returns for follow up appointment for chronic pain and medication refill. He states his pain is located in his bilateral shoulders, mid- lower back pain radiating into his right hip and right lower extremity. Also reports generalized joint pain. Matthew Sloan states he's not receiving any relief with his current medication regimen, we will change his analgesic to Oxycodone, he was instructed to call office or send a My-Chart message in two weeks for medication evaluation, he verbalizes understanding.  He rates his pain 9. His current exercise regime is walking and performing stretching exercises.  Matthew Sloan reports on 03/12/2020, he was walking in his kitchen when his right leg gave out, he fell on his left side. He was able to pick himself up, he didn'e seek medical attention. Also states he is awaiting a scheduled date for his right hip placement, at this time surgery placed on hold, due to bed shortage with COVID patients. He states Dr. Dion Saucier is awre of the above, we will continue to monitor, educated on falls prevention he verbalizes understanding.   Matthew Sloan is 50.00 MME. Last UDS was Performed on 11/13/2019, it was consistent.      Pain Inventory Average Pain 9 Pain Right Now 9 My pain is sharp, burning, stabbing, tingling and aching  In the last 24 hours, has pain interfered with the following? General activity 10 Relation with others 8 Enjoyment of life 8 What TIME of day is your pain at its worst? morning , daytime, evening and night Sleep (in general) Poor  Pain is worse with: walking, bending, sitting, inactivity and standing Pain improves with: medication Relief from Meds: 2  Family History  Problem Relation Age of Onset  . Cancer Mother 65       breast cancer  . Heart disease Father 74       CHF   Social History     Socioeconomic History  . Marital status: Single    Spouse name: Not on file  . Number of children: Not on file  . Years of education: Not on file  . Highest education level: Not on file  Occupational History  . Not on file  Tobacco Use  . Smoking status: Current Every Day Smoker    Packs/day: 1.00    Years: 30.00    Pack years: 30.00    Types: Cigarettes  . Smokeless tobacco: Never Used  . Tobacco comment: working on it-down to 0.5 ppd  Vaping Use  . Vaping Use: Never used  Substance and Sexual Activity  . Alcohol use: No  . Drug use: No  . Sexual activity: Not on file  Other Topics Concern  . Not on file  Social History Narrative  . Not on file   Social Determinants of Health   Financial Resource Strain:   . Difficulty of Paying Living Expenses: Not on file  Food Insecurity:   . Worried About Programme researcher, broadcasting/film/video in the Last Year: Not on file  . Ran Out of Food in the Last Year: Not on file  Transportation Needs:   . Lack of Transportation (Medical): Not on file  . Lack of Transportation (Non-Medical): Not on file  Physical Activity:   . Days of Exercise per Week: Not on file  . Minutes of Exercise per Session: Not  on file  Stress:   . Feeling of Stress : Not on file  Social Connections:   . Frequency of Communication with Friends and Family: Not on file  . Frequency of Social Gatherings with Friends and Family: Not on file  . Attends Religious Services: Not on file  . Active Member of Clubs or Organizations: Not on file  . Attends Banker Meetings: Not on file  . Marital Status: Not on file   Past Surgical History:  Procedure Laterality Date  . bilateral tubes placed in ears  04/12/2012  . CARPAL TUNNEL RELEASE  12/15/2011   Procedure: CARPAL TUNNEL RELEASE;  Surgeon: [name], MD;  Location: [facility];  Service: Orthopedics;  Laterality: Left;  [name] MULTIPLE TOOTH EXTRACTIONS    . SHOULDER SURGERY  2012   lt  . ULNAR TUNNEL  RELEASE  12/15/2011   Procedure: CUBITAL TUNNEL RELEASE;  Surgeon: [name], MD;  Location: [facility];  Service: Orthopedics;  Laterality: Left;  left cubital tunnel release   Past Surgical History:  Procedure Laterality Date  . bilateral tubes placed in ears  04/12/2012  . CARPAL TUNNEL RELEASE  12/15/2011   Procedure: CARPAL TUNNEL RELEASE;  Surgeon: [name], MD;  Location: [facility];  Service: Orthopedics;  Laterality: Left;  [name] MULTIPLE TOOTH EXTRACTIONS    . SHOULDER SURGERY  2012   lt  . ULNAR TUNNEL RELEASE  12/15/2011   Procedure: CUBITAL TUNNEL RELEASE;  Surgeon: [name], MD;  Location: [facility];  Service: Orthopedics;  Laterality: Left;  left cubital tunnel release   Past Medical History:  Diagnosis Date  . Allergy   . Chronic pain due to trauma   . Disturbance of skin sensation   . Intercostal neuralgia   . Lumbosacral neuritis   . Myocardial infarction (HCC)   . Rotator cuff (capsule) sprain   . Snores    BP 124/84   Pulse 88   Temp 97.8 F (36.6 C)   Ht 5\' 9"  (1.753 m)   Wt 217 lb (98.4 kg)   SpO2 92%   BMI 32.05 kg/m   Opioid Risk Score:   Fall Risk Score:  `1  Depression screen PHQ 2/9  Depression screen [facility] 2/9 02/12/2020 01/12/2020 12/15/2019 12/16/2018 11/10/2018 10/19/2018 04/18/2018  Decreased Interest 0 0 0 0 0 0 0  Down, Depressed, Hopeless - 0 0 0 0 0 0  PHQ - 2 Score 0 0 0 0 0 0 0  Altered sleeping - - - - - - -  Tired, decreased energy - - - - - - -  Feeling bad or failure about yourself  - - - - - - -  Trouble concentrating - - - - - - -  Moving slowly or fidgety/restless - - - - - - -  Suicidal thoughts - - - - - - -  PHQ-9 Score - - - - - - -    Review of Systems  Constitutional: Negative.   HENT: Negative.   Eyes: Negative.   Cardiovascular: Negative.   Gastrointestinal: Negative.   Musculoskeletal: Positive for arthralgias, back pain, gait problem, myalgias, neck pain and neck  stiffness.       Spasms  Skin: Negative.   Neurological: Positive for tremors, weakness and numbness.       Tingling   Hematological: Negative.   Psychiatric/Behavioral: Negative.        Objective:  Physical Exam Vitals and nursing note reviewed.  Constitutional:      Appearance: Normal appearance.  Neck:     Comments: Cervical Paraspinal Tenderness: C-5-C-6 Cardiovascular:     Rate and Rhythm: Normal rate and regular rhythm.     Pulses: Normal pulses.     Heart sounds: Normal heart sounds.  Pulmonary:     Effort: Pulmonary effort is normal.     Breath sounds: Normal breath sounds.  Musculoskeletal:     Cervical back: Normal range of motion and neck supple.     Comments: Normal Muscle Bulk and Muscle Testing Reveals:  Upper Extremities: Right: Full ROM and Muscle Strength 5/5 Left Upper Extremity: Decreased ROM 45 Degrees and Muscle Strength 5/5 Bilateral AC Joint Tenderness Thoracic and Lumbar Hypersensitivity Right Greater Trochanter Tenderness Lower Extremities: Decreased ROM and Muscle Strength 5/5 Right Lower Extremity Flexion Produces Pain into his Lumbar, Right Hip and Right Lower Extremity Arises from chair slowly using cane for support Antalgic Gait   Skin:    General: Skin is warm and dry.  Neurological:     Mental Status: He is alert and oriented to person, place, and time.  Psychiatric:        Mood and Affect: Mood normal.        Behavior: Behavior normal.           Assessment & Plan:  1. 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 Hydrocodone Discontinued.  We will continue the opioid monitoring program, this consists of regular clinic visits, examinations, urine drug screen, pill counts as well as use of [place] Controlled Substance Reporting  system. A 12 month History has been reviewed on the [place] Controlled Substance Reporting System 03/13/2020.  2. Lumbar Radiculitis: Continuecurrent medication regimen withGabapentin.03/13/2020 3. Intercostal neuralgia: Unable to continue Flector Patch due to cost, Continuecurrent medication regimen withVoltaren Gel,Robaxin and Neurontin.03/13/2020 4. Insomnia: Continuecurrent medication regime withtrazodone.03/13/2020 5. Muscle Spasm: Continuecurrent medication regimen withRobaxin.03/13/2020 6. Polyarthralgia: Continue current medication regime. Continue to Monitor.03/13/2020 7. Chronic Bilateral Thoracic Pain: Continue HEP and Continue Current medication regimen. Continue to monitor.03/13/2020 8. Chronic Right Hip Pain/ Avascular Necrosis:Matthew Sloan states he is awaiting a surgery date with Dr. Dion Saucier. S/P Hip Injection by Dr. Sherlyn Lees relief noted.03/13/2020 9. Cervical Lymphadenopathy: Endocrinologist Following. Continue to monitor.03/13/2020 10. Fall at Home: Educated on [employer], he verbalizes understanding.   F/U in44month

## 2020-03-13 NOTE — Patient Instructions (Signed)
Set Up your My- Chart if you can.  Any Problems call [phone]  Call or send a My-Chart message in two weeks with an update on medication change:   [phone]

## 2020-03-14 ENCOUNTER — Encounter: Payer: Medicare HMO | Admitting: Registered Nurse

## 2020-04-12 ENCOUNTER — Encounter: Payer: Medicare HMO | Admitting: Registered Nurse

## 2020-04-16 ENCOUNTER — Other Ambulatory Visit: Payer: Self-pay | Admitting: "Endocrinology

## 2020-04-16 ENCOUNTER — Telehealth: Payer: Self-pay | Admitting: "Endocrinology

## 2020-04-16 DIAGNOSIS — E0520 Thyrotoxicosis with toxic multinodular goiter without thyrotoxic crisis or storm: Secondary | ICD-10-CM

## 2020-04-16 NOTE — Telephone Encounter (Signed)
In that case, I also want to do thyroid uptake and scan in addition to the labs. These 2 studies will help [place] make decision on treatment.

## 2020-04-16 NOTE — Telephone Encounter (Signed)
I spoke with patient and he has not taken the medication in about 3-4 months as he ran out. Please advise.

## 2020-04-16 NOTE — Telephone Encounter (Signed)
Patient was a new patient here in 2020 of Oct and has not been seen here since. Wants to sch an appt. Does he need labs before

## 2020-04-16 NOTE — Telephone Encounter (Signed)
Please advise 

## 2020-04-16 NOTE — Telephone Encounter (Signed)
Is her still on Methimazole?  Yes he needs labs and a visit ( ordered).

## 2020-04-16 NOTE — Telephone Encounter (Signed)
Mailed patient lab orders. Pt will call back and sch once he is able to get labs done.

## 2020-04-17 NOTE — Telephone Encounter (Signed)
Order sent to [facility] for scan and patient is aware, he will call back once it is scheduled to schedule f/u appt here in office.

## 2020-04-19 ENCOUNTER — Other Ambulatory Visit: Payer: Self-pay

## 2020-04-19 ENCOUNTER — Encounter: Payer: Medicare HMO | Attending: Physical Medicine & Rehabilitation | Admitting: Registered Nurse

## 2020-04-19 ENCOUNTER — Encounter: Payer: Self-pay | Admitting: Registered Nurse

## 2020-04-19 VITALS — BP 116/57 | HR 93 | Temp 98.3°F | Ht 69.0 in | Wt 210.4 lb

## 2020-04-19 DIAGNOSIS — M519 Unspecified thoracic, thoracolumbar and lumbosacral intervertebral disc disorder: Secondary | ICD-10-CM | POA: Diagnosis not present

## 2020-04-19 DIAGNOSIS — G8929 Other chronic pain: Secondary | ICD-10-CM | POA: Diagnosis present

## 2020-04-19 DIAGNOSIS — M25551 Pain in right hip: Secondary | ICD-10-CM | POA: Diagnosis present

## 2020-04-19 DIAGNOSIS — Z5181 Encounter for therapeutic drug level monitoring: Secondary | ICD-10-CM | POA: Diagnosis present

## 2020-04-19 DIAGNOSIS — M5416 Radiculopathy, lumbar region: Secondary | ICD-10-CM | POA: Insufficient documentation

## 2020-04-19 DIAGNOSIS — M546 Pain in thoracic spine: Secondary | ICD-10-CM | POA: Diagnosis present

## 2020-04-19 DIAGNOSIS — M8700 Idiopathic aseptic necrosis of unspecified bone: Secondary | ICD-10-CM | POA: Diagnosis present

## 2020-04-19 DIAGNOSIS — Z79891 Long term (current) use of opiate analgesic: Secondary | ICD-10-CM | POA: Diagnosis not present

## 2020-04-19 DIAGNOSIS — G894 Chronic pain syndrome: Secondary | ICD-10-CM | POA: Insufficient documentation

## 2020-04-19 DIAGNOSIS — M2550 Pain in unspecified joint: Secondary | ICD-10-CM | POA: Diagnosis present

## 2020-04-19 MED ORDER — OXYCODONE HCL 10 MG PO TABS
10.0000 mg | ORAL_TABLET | Freq: Four times a day (QID) | ORAL | 0 refills | Status: DC | PRN
Start: 2020-04-19 — End: 2020-05-17

## 2020-04-19 NOTE — Progress Notes (Signed)
Subjective:    Patient ID: Matthew Sloan, male    DOB: 1960-07-24, 59 y.o.   MRN: [id number]  HPI: Matthew Sloan is a 59 y.o. male who returns for follow up appointment for chronic pain and medication refill. He states his pain is located in his mid- lower back pain radiating into his right hi. Also reports generalized joint pain. He rates his  Pain 9. His current exercise regime is walking and performing stretching exercises.  Ms. Duplantis Morphine equivalent is 60.00  MME.  UDS ordered today.   Pain Inventory Average Pain 9 Pain Right Now 9 My pain is sharp, burning, stabbing, tingling and aching  In the last 24 hours, has pain interfered with the following? General activity 10 Relation with others 9 Enjoyment of life 9 What TIME of day is your pain at its worst? morning , daytime, evening and night Sleep (in general) Poor  Pain is worse with: walking, bending, sitting, inactivity and standing Pain improves with: medication Relief from Meds: 2  Family History  Problem Relation Age of Onset  . Cancer Mother 20       breast cancer  . Heart disease Father 48       CHF   Social History   Socioeconomic History  . Marital status: Single    Spouse name: Not on file  . Number of children: Not on file  . Years of education: Not on file  . Highest education level: Not on file  Occupational History  . Not on file  Tobacco Use  . Smoking status: Current Every Day Smoker    Packs/day: 1.00    Years: 30.00    Pack years: 30.00    Types: Cigarettes  . Smokeless tobacco: Never Used  . Tobacco comment: working on it-down to 0.5 ppd  Vaping Use  . Vaping Use: Never used  Substance and Sexual Activity  . Alcohol use: No  . Drug use: No  . Sexual activity: Not on file  Other Topics Concern  . Not on file  Social History Narrative  . Not on file   Social Determinants of Health   Financial Resource Strain:   . Difficulty of Paying Living Expenses: Not on file  Food  Insecurity:   . Worried About Programme researcher, broadcasting/film/video in the Last Year: Not on file  . Ran Out of Food in the Last Year: Not on file  Transportation Needs:   . Lack of Transportation (Medical): Not on file  . Lack of Transportation (Non-Medical): Not on file  Physical Activity:   . Days of Exercise per Week: Not on file  . Minutes of Exercise per Session: Not on file  Stress:   . Feeling of Stress : Not on file  Social Connections:   . Frequency of Communication with Friends and Family: Not on file  . Frequency of Social Gatherings with Friends and Family: Not on file  . Attends Religious Services: Not on file  . Active Member of Clubs or Organizations: Not on file  . Attends Banker Meetings: Not on file  . Marital Status: Not on file   Past Surgical History:  Procedure Laterality Date  . bilateral tubes placed in ears  04/12/2012  . CARPAL TUNNEL RELEASE  12/15/2011   Procedure: CARPAL TUNNEL RELEASE;  Surgeon: [name], MD;  Location: [facility];  Service: Orthopedics;  Laterality: Left;  [name] MULTIPLE TOOTH EXTRACTIONS    . SHOULDER SURGERY  2012   lt  . ULNAR TUNNEL RELEASE  12/15/2011   Procedure: CUBITAL TUNNEL RELEASE;  Surgeon: [name], MD;  Location: [facility];  Service: Orthopedics;  Laterality: Left;  left cubital tunnel release   Past Surgical History:  Procedure Laterality Date  . bilateral tubes placed in ears  04/12/2012  . CARPAL TUNNEL RELEASE  12/15/2011   Procedure: CARPAL TUNNEL RELEASE;  Surgeon: [name], MD;  Location: [facility];  Service: Orthopedics;  Laterality: Left;  [name] MULTIPLE TOOTH EXTRACTIONS    . SHOULDER SURGERY  2012   lt  . ULNAR TUNNEL RELEASE  12/15/2011   Procedure: CUBITAL TUNNEL RELEASE;  Surgeon: [name], MD;  Location: [facility];  Service: Orthopedics;  Laterality: Left;  left cubital tunnel release   Past Medical History:  Diagnosis Date  . Allergy     . Chronic pain due to trauma   . Disturbance of skin sensation   . Intercostal neuralgia   . Lumbosacral neuritis   . Myocardial infarction (HCC)   . Rotator cuff (capsule) sprain   . Snores    BP (!) 116/57   Pulse 93   Temp 98.3 F (36.8 C)   Ht 5\' 9"  (1.753 m)   Wt 210 lb 6.4 oz (95.4 kg)   SpO2 94%   BMI 31.07 kg/m   Opioid Risk Score:   Fall Risk Score:  `1  Depression screen PHQ 2/9  Depression screen [facility] 2/9 02/12/2020 01/12/2020 12/15/2019 12/16/2018 11/10/2018 10/19/2018 04/18/2018  Decreased Interest 0 0 0 0 0 0 0  Down, Depressed, Hopeless - 0 0 0 0 0 0  PHQ - 2 Score 0 0 0 0 0 0 0  Altered sleeping - - - - - - -  Tired, decreased energy - - - - - - -  Feeling bad or failure about yourself  - - - - - - -  Trouble concentrating - - - - - - -  Moving slowly or fidgety/restless - - - - - - -  Suicidal thoughts - - - - - - -  PHQ-9 Score - - - - - - -   Review of Systems  Musculoskeletal: Positive for back pain.       Shoulder, leg, arm pain  All other systems reviewed and are negative.      Objective:   Physical Exam Vitals and nursing note reviewed.  Constitutional:      Appearance: Normal appearance.  Cardiovascular:     Rate and Rhythm: Normal rate and regular rhythm.     Pulses: Normal pulses.     Heart sounds: Normal heart sounds.  Pulmonary:     Effort: Pulmonary effort is normal.     Breath sounds: Normal breath sounds.  Musculoskeletal:     Cervical back: Normal range of motion and neck supple.     Comments: Normal Muscle Bulk and Muscle Testing Reveals:  Upper Extremities: Right : Full ROM and Muscle Strength 5/5 Left Upper Extremity: Decreased ROM 45 Degrees and Muscle Strength 5/5  Thoracic Paraspinal Tenderness: T-7-T-9  Lumbar Paraspinal Tenderness: L-3-L-5 Right Greater Trochanter Tenderness Lower Extremities: Decreased ROM and Muscle Strength 5/5 Right Lower Extremity Flexion Produces Pain into his lower back, right hip and right lower  extremity Left Lower Extremity Flexion Produces Pain into his lower extremity Arises from chair slowly Antalgic Gait   Skin:    General: Skin is warm and dry.  Neurological:  Mental Status: He is alert and oriented to person, place, and time.  Psychiatric:        Mood and Affect: Mood normal.        Behavior: Behavior normal.           Assessment & Plan:  1. 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 A 12 month History has been reviewed on the [place] Controlled Substance Reporting System 04/19/2020.  2. Lumbar Radiculitis: Continuecurrent medication regimen withGabapentin.04/19/2020 3. Intercostal neuralgia: Unable to continue Flector Patch due to cost, Continuecurrent medication regimen withVoltaren Gel,Robaxin and Neurontin.04/19/2020 4. Insomnia: Continuecurrent medication regime withtrazodone.04/19/2020 5. Muscle Spasm: Continuecurrent medication regimen withRobaxin.04/19/2020 6. Polyarthralgia: Continue current medication regime. Continue to Monitor.04/19/2020 7. Chronic Bilateral Thoracic Pain: Continue HEP and Continue Current medication regimen. Continue to monitor.04/19/2020 8. Chronic Right Hip Pain/ Avascular Necrosis:Mr. Roback states he's awaiting a surgery date with Dr. Dion Saucier. S/P Hip Injection by Dr. Sherlyn Lees relief noted.04/19/2020 9. Cervical Lymphadenopathy: Endocrinologist Following. Continue to monitor.04/20/2020   F/U in40month  20 minutes of face to face patient care time was spent during this visit. All questions were  encouraged and answered.

## 2020-04-19 NOTE — Patient Instructions (Signed)
Call or Send a My- Chart message on 05/13/2020

## 2020-04-24 ENCOUNTER — Encounter: Payer: Self-pay | Admitting: "Endocrinology

## 2020-04-24 ENCOUNTER — Telehealth: Payer: Self-pay | Admitting: *Deleted

## 2020-04-24 LAB — TOXASSURE SELECT,+ANTIDEPR,UR

## 2020-04-24 NOTE — Telephone Encounter (Signed)
Urine drug screen for this encounter is consistent for prescribed medication 

## 2020-04-29 ENCOUNTER — Other Ambulatory Visit: Payer: Self-pay

## 2020-04-29 ENCOUNTER — Telehealth (INDEPENDENT_AMBULATORY_CARE_PROVIDER_SITE_OTHER): Payer: Medicare HMO | Admitting: "Endocrinology

## 2020-04-29 ENCOUNTER — Encounter: Payer: Self-pay | Admitting: "Endocrinology

## 2020-04-29 VITALS — Ht 69.0 in | Wt 207.0 lb

## 2020-04-29 DIAGNOSIS — E0590 Thyrotoxicosis, unspecified without thyrotoxic crisis or storm: Secondary | ICD-10-CM | POA: Diagnosis not present

## 2020-04-29 DIAGNOSIS — E0500 Thyrotoxicosis with diffuse goiter without thyrotoxic crisis or storm: Secondary | ICD-10-CM

## 2020-04-29 DIAGNOSIS — E0520 Thyrotoxicosis with toxic multinodular goiter without thyrotoxic crisis or storm: Secondary | ICD-10-CM | POA: Insufficient documentation

## 2020-04-29 NOTE — Progress Notes (Signed)
04/29/2020                                     Endocrinology Telehealth Visit Follow up Note -During COVID -19 Pandemic  This visit type was conducted  via telephone due to national recommendations for restrictions regarding the COVID-19 Pandemic  in an effort to limit this patient's exposure and mitigate transmission of the corona virus.   I connected with Matthew Sloan on 04/29/2020   by telephone and verified that I am speaking with the correct person using two identifiers. Matthew Sloan, June 19, 1961. he has verbally consented to this visit.  I was in my office and patient was in his residence. No other persons were with me during the encounter. All issues noted in this document were discussed and addressed. The format was not optimal for physical exam.   Subjective:    Patient ID: Matthew Sloan, male    DOB: [birth year], PCP Delanna Notice, MD.   Past Medical History:  Diagnosis Date  . Allergy   . Chronic pain due to trauma   . Disturbance of skin sensation   . Intercostal neuralgia   . Lumbosacral neuritis   . Myocardial infarction ([place])   . Rotator cuff (capsule) sprain   . Snores     Past Surgical History:  Procedure Laterality Date  . bilateral tubes placed in ears  04/12/2012  . CARPAL TUNNEL RELEASE  12/15/2011   Procedure: CARPAL TUNNEL RELEASE;  Surgeon: Tennis Must, MD;  Location: [facility];  Service: Orthopedics;  Laterality: Left;  [name] MULTIPLE TOOTH EXTRACTIONS    . SHOULDER SURGERY  2012   lt  . ULNAR TUNNEL RELEASE  12/15/2011   Procedure: CUBITAL TUNNEL RELEASE;  Surgeon: Tennis Must, MD;  Location: [facility];  Service: Orthopedics;  Laterality: Left;  left cubital tunnel release    Social History   Socioeconomic History  . Marital status: Single    Spouse name: Not on file  . Number of children: Not on file  . Years of education: Not on file  . Highest education level: Not on file  Occupational History  . Not on file   Tobacco Use  . Smoking status: Current Every Day Smoker    Packs/day: 1.00    Years: 30.00    Pack years: 30.00    Types: Cigarettes  . Smokeless tobacco: Never Used  . Tobacco comment: working on it-down to 0.5 ppd  Vaping Use  . Vaping Use: Never used  Substance and Sexual Activity  . Alcohol use: No  . Drug use: No  . Sexual activity: Not on file  Other Topics Concern  . Not on file  Social History Narrative  . Not on file   Social Determinants of Health   Financial Resource Strain:   . Difficulty of Paying Living Expenses: Not on file  Food Insecurity:   . Worried About Charity fundraiser in the Last Year: Not on file  . Ran Out of Food in the Last Year: Not on file  Transportation Needs:   . Lack of Transportation (Medical): Not on file  . Lack of Transportation (Non-Medical): Not on file  Physical Activity:   . Days of Exercise per Week: Not on file  . Minutes of Exercise per Session: Not on file  Stress:   . Feeling of Stress : Not on file  Social Connections:   . Frequency of Communication with Friends and Family: Not on file  . Frequency of Social Gatherings with Friends and Family: Not on file  . Attends Religious Services: Not on file  . Active Member of Clubs or Organizations: Not on file  . Attends Archivist Meetings: Not on file  . Marital Status: Not on file    Family History  Problem Relation Age of Onset  . Cancer Mother 69       breast cancer  . Heart disease Father 34       CHF    Outpatient Encounter Medications as of 04/29/2020  Medication Sig  . metoprolol tartrate (LOPRESSOR) 25 MG tablet Take 25 mg by mouth 2 (two) times daily.  [name] albuterol (VENTOLIN HFA) 108 (90 Base) MCG/ACT inhaler Inhale 2 puffs into the lungs as needed.  [name] apixaban (ELIQUIS) 5 MG TABS tablet Take 5 mg by mouth 2 (two) times daily.  [name] BREO ELLIPTA 200-25 MCG/INH AEPB Inhale 1 puff into the lungs daily.  [name] gabapentin (NEURONTIN) 800 MG tablet Take 1  tablet (800 mg total) by mouth 3 (three) times daily.  . methocarbamol (ROBAXIN) 500 MG tablet Take 1 tablet (500 mg total) by mouth 3 (three) times daily.  . Oxycodone HCl 10 MG TABS Take 1 tablet (10 mg total) by mouth every 6 (six) hours as needed.  . pravastatin (PRAVACHOL) 40 MG tablet Take 1 tablet by mouth at bedtime.  . promethazine (PHENERGAN) 12.5 MG tablet Take 12.5 mg by mouth 2 (two) times daily as needed.  . traZODone (DESYREL) 100 MG tablet TAKE 1 TABLET (100 MG TOTAL) BY MOUTH AT BEDTIME. (Patient taking differently: TAKE 1 TABLET (100 MG TOTAL) BY MOUTH AT BEDTIME. AS NEEDED)  . [DISCONTINUED] diclofenac sodium (VOLTAREN) 1 % GEL Apply 2 g topically 3 (three) times daily. Use as directed  . [DISCONTINUED] metoprolol tartrate (LOPRESSOR) 25 MG tablet Take 1 tablet by mouth 2 (two) times daily.   No facility-administered encounter medications on file as of 04/29/2020.    ALLERGIES: Allergies  Allergen Reactions  . Penicillins     VACCINATION STATUS:  There is no immunization history on file for this patient.   HPI  Matthew Sloan is 59 y.o. male who is being engaged for telehealth via telephone for follow-up of hyperthyroidism.    Referral requested by  Delanna Notice, MD. -Patient was seen October 2020 in consult, did not return for follow-up visits.  Interval studies were reviewed and consistent with primary hyperthyroidism from Graves' disease.    Patient reports that he was diagnosed with hypothyroidism approximately 3 years ago.  He was initiated on treatment with methimazole various doses, tapered dose after his last visit.  In the interval, he lost 50+ pounds, denies palpitations however remains on metoprolol.  -His most recent thyroid uptake and scan in [facility] confirms 56.9% uptake in 24 hours-homogeneous suggestive of Graves' disease.  It was similarly elevated at 57% on previous study.   he reports on and off dysphagia, choking, and  shortness of breath on lying flat on his back.   His ultrasound from November 2020 confirmed goiter with no discrete nodules. -He denies recent voice change.  He is a chronic active smoker.   he denies family history of thyroid dysfunction.  He denies  family hx of thyroid cancer.   he  is willing to proceed with appropriate work up and therapy for thyrotoxicosis.  Review of systems  Constitutional: + weight loss,  + fatigue, + subjective hyperthermia Eyes: no blurry vision, - xerophthalmia ENT: no sore throat, no nodules palpated in throat, no dysphagia/odynophagia, nor hoarseness Cardiovascular: no Chest Pain, no Shortness of Breath, -  palpitations, no leg swelling Respiratory: no cough, no SOB Gastrointestinal: no Nausea, no Vomiting, no Diarhhea  Skin: no rashes Neurological: -  tremors, no numbness, no tingling, no dizziness Psychiatric: no depression, -  anxiety   Objective:    Ht '5\' 9"'  (1.753 m)   Wt 207 lb (93.9 kg)   BMI 30.57 kg/m   Wt Readings from Last 3 Encounters:  04/29/20 207 lb (93.9 kg)  04/19/20 210 lb 6.4 oz (95.4 kg)  03/13/20 217 lb (98.4 kg)                           CMP     Component Value Date/Time   NA 139 01/03/2010 0545   K 4.3 01/03/2010 0545   CL 103 01/03/2010 0545   CO2 26 01/03/2010 0545   GLUCOSE 104 (H) 01/03/2010 0545   BUN 14 01/03/2010 0545   CREATININE 0.89 01/03/2010 0545   CALCIUM 8.7 01/03/2010 0545   PROT 6.7 10/10/2012 1031   ALBUMIN 4.3 10/10/2012 1031   AST 18 10/10/2012 1031   ALT 30 10/10/2012 1031   ALKPHOS 50 10/10/2012 1031   BILITOT 0.4 10/10/2012 1031   GFRNONAA >60 01/03/2010 0545   GFRAA  01/03/2010 0545    >60        The eGFR has been calculated using the MDRD equation. This calculation has not been validated in all clinical situations. eGFR's persistently <60 mL/min signify possible Chronic Kidney Disease.     CBC    Component Value Date/Time   WBC 8.6 01/07/2010  0945   RBC 3.81 (L) 01/07/2010 0945   HGB 15.7 12/15/2011 0809   HCT 33.9 (L) 01/07/2010 0945   PLT 514 (H) 01/07/2010 0945   MCV 89.1 01/07/2010 0945   MCH 29.6 01/07/2010 0945   MCHC 33.3 01/07/2010 0945   RDW 13.6 01/07/2010 0945   LYMPHSABS 2.3 01/07/2010 0945   MONOABS 0.6 01/07/2010 0945   EOSABS 0.7 01/07/2010 0945   BASOSABS 0.1 01/07/2010 0945   Recent Results (from the past 2160 hour(s))  ToxAssure Select Plus     Status: None   Collection Time: 04/19/20 11:45 AM  Result Value Ref Range   Summary Note     Comment: ==================================================================== ToxAssure Select,+Antidepr,UR ==================================================================== Test                             Result       Flag       Units  Drug Present   Oxycodone                      2157                    ng/mg creat   Oxymorphone                    >5882                   ng/mg creat   Noroxycodone                   2952  ng/mg creat   Noroxymorphone                 1615                    ng/mg creat    Sources of oxycodone are scheduled prescription medications.    Oxymorphone, noroxycodone, and noroxymorphone are expected    metabolites of oxycodone. Oxymorphone is also available as a    scheduled prescription medication.  ==================================================================== Test                      Result    Flag   Units      Ref Range   Creatinine              170              mg/dL      >=20 ======================================== ============================ Declared Medications:  Medication list was not provided. ==================================================================== For clinical consultation, please call [phone]. ====================================================================     April 24, 2020 thyroid uptake and scan: 24-hour uptake 56.9%.  Normal range 15 to 35%.  Previously, the  24-hour uptake was similarly elevated to 57.3%. Impression: Homogeneously increased uptake within an enlarged thyroid gland, is suggestive of Graves' disease.     Assessment & Plan:   1.  Hyperthyroidism 2.  Graves' disease he is being seen at a kind request of Delanna Notice, MD. his history and most recent labs are reviewed, and he was examined clinically.  Patient has primary hyperthyroidism from Graves' disease.  He is recent thyroid ultrasound was consistent with goiter with no discrete nodules.  The potential risks of untreated thyrotoxicosis and the need for definitive therapy have been discussed in detail with him, and he agrees to proceed with  treatment plan. Options of therapy are discussed with him.   He was previously briefly treated with methimazole with only brief success.  Best choice of treatment for him will be radioactive iodine thyroid ablation.  This treatment was discussed in detail with him and will be  ordered to be administered as soon as possible in [facility].   -Possible reduction in the size of his thyroid will relieve his minimal compression symptoms. 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    -He is already on metoprolol 25 mg p.o. twice daily, advised to be consistent taking his medication, did not initiate any additional beta-blockers.   - I advised him to maintain close follow up with Delanna Notice, MD for primary care needs.      - Time spent on this patient care encounter:  20 minutes of which 50% was spent in  counseling and the rest reviewing  his current and  previous labs / studies and medications  doses and developing a plan for long term care. Matthew Sloan  participated in the discussions, expressed understanding, and voiced agreement with the above plans.  All questions were answered to his satisfaction. he is encouraged  to contact clinic should he have any questions or concerns prior to his return visit.   Follow up plan: Return in about 9 weeks (around 07/01/2020) for F/U with Labs after I131 Therapy.   Thank you for involving me in the care of this pleasant patient, and I will continue to update you with his progress.  Glade Lloyd, MD [facility]  Phone: [phone]  Fax: [phone]   04/29/2020, 3:44 PM  This note was partially dictated with voice recognition software. Similar sounding words can be transcribed inadequately or may not  be corrected upon review.

## 2020-05-02 ENCOUNTER — Telehealth: Payer: Self-pay

## 2020-05-02 NOTE — Telephone Encounter (Signed)
I refaxed the order and called patient back, he had a bad connection said he would call us back.

## 2020-05-02 NOTE — Telephone Encounter (Signed)
Pt said he has not heard from anyone about his scan that he needs to do in [place]

## 2020-05-17 ENCOUNTER — Encounter: Payer: Self-pay | Admitting: Registered Nurse

## 2020-05-17 ENCOUNTER — Encounter: Payer: Medicare HMO | Attending: Physical Medicine & Rehabilitation | Admitting: Registered Nurse

## 2020-05-17 ENCOUNTER — Other Ambulatory Visit: Payer: Self-pay

## 2020-05-17 VITALS — BP 116/57 | Ht 69.0 in | Wt 210.0 lb

## 2020-05-17 DIAGNOSIS — M8700 Idiopathic aseptic necrosis of unspecified bone: Secondary | ICD-10-CM | POA: Insufficient documentation

## 2020-05-17 DIAGNOSIS — M5416 Radiculopathy, lumbar region: Secondary | ICD-10-CM | POA: Diagnosis not present

## 2020-05-17 DIAGNOSIS — M25551 Pain in right hip: Secondary | ICD-10-CM | POA: Insufficient documentation

## 2020-05-17 DIAGNOSIS — Z5181 Encounter for therapeutic drug level monitoring: Secondary | ICD-10-CM | POA: Insufficient documentation

## 2020-05-17 DIAGNOSIS — M546 Pain in thoracic spine: Secondary | ICD-10-CM

## 2020-05-17 DIAGNOSIS — M519 Unspecified thoracic, thoracolumbar and lumbosacral intervertebral disc disorder: Secondary | ICD-10-CM | POA: Diagnosis not present

## 2020-05-17 DIAGNOSIS — M2550 Pain in unspecified joint: Secondary | ICD-10-CM | POA: Insufficient documentation

## 2020-05-17 DIAGNOSIS — G8929 Other chronic pain: Secondary | ICD-10-CM | POA: Insufficient documentation

## 2020-05-17 DIAGNOSIS — Z79891 Long term (current) use of opiate analgesic: Secondary | ICD-10-CM | POA: Insufficient documentation

## 2020-05-17 DIAGNOSIS — G894 Chronic pain syndrome: Secondary | ICD-10-CM | POA: Insufficient documentation

## 2020-05-17 MED ORDER — OXYCODONE HCL 10 MG PO TABS
10.0000 mg | ORAL_TABLET | Freq: Four times a day (QID) | ORAL | 0 refills | Status: DC | PRN
Start: 2020-05-17 — End: 2020-05-17

## 2020-05-17 MED ORDER — OXYCODONE HCL 10 MG PO TABS
10.0000 mg | ORAL_TABLET | Freq: Four times a day (QID) | ORAL | 0 refills | Status: DC | PRN
Start: 2020-05-17 — End: 2020-06-20

## 2020-05-17 NOTE — Progress Notes (Signed)
Subjective:    Patient ID: Matthew Sloan, male    DOB: Aug 19, 1960, 59 y.o.   MRN: [id number]  HPI: Matthew Sloan is a 59 y.o. male whose appointment was changed to a telephone visit. Mr. Onnen had Radioactive Iodine Thyroid Ablation on 05/13/2020, he was instructed not to go around people. He agrees with telephone visit and verbalizes understanding. He states his pain is located in his mid- lower back pain radiating into his right hip and right lower extremity. Also reports generalized pain all over. He rates his pain 9. His current exercise regime is walking and performing stretching exercises .  Mr. Gappa Morphine equivalent is 60.00 MME.  Last UDS was Performed on 04/19/2020, it was consistent.    Pain Inventory Average Pain 9 Pain Right Now 9 My pain is constant, sharp, burning, dull, stabbing, tingling and aching  In the last 24 hours, has pain interfered with the following? General activity 9 Relation with others 0 Enjoyment of life 10 What TIME of day is your pain at its worst? morning , daytime, evening and night Sleep (in general) Poor  Pain is worse with: walking, bending, sitting, inactivity, standing and some activites Pain improves with: Nothing Relief from Meds: 3  Family History  Problem Relation Age of Onset  . Cancer Mother 40       breast cancer  . Heart disease Father 19       CHF   Social History   Socioeconomic History  . Marital status: Single    Spouse name: Not on file  . Number of children: Not on file  . Years of education: Not on file  . Highest education level: Not on file  Occupational History  . Not on file  Tobacco Use  . Smoking status: Current Every Day Smoker    Packs/day: 1.00    Years: 30.00    Pack years: 30.00    Types: Cigarettes  . Smokeless tobacco: Never Used  . Tobacco comment: working on it-down to 0.5 ppd  Vaping Use  . Vaping Use: Never used  Substance and Sexual Activity  . Alcohol use: No  . Drug use: No  .  Sexual activity: Not on file  Other Topics Concern  . Not on file  Social History Narrative  . Not on file   Social Determinants of Health   Financial Resource Strain:   . Difficulty of Paying Living Expenses: Not on file  Food Insecurity:   . Worried About Programme researcher, broadcasting/film/video in the Last Year: Not on file  . Ran Out of Food in the Last Year: Not on file  Transportation Needs:   . Lack of Transportation (Medical): Not on file  . Lack of Transportation (Non-Medical): Not on file  Physical Activity:   . Days of Exercise per Week: Not on file  . Minutes of Exercise per Session: Not on file  Stress:   . Feeling of Stress : Not on file  Social Connections:   . Frequency of Communication with Friends and Family: Not on file  . Frequency of Social Gatherings with Friends and Family: Not on file  . Attends Religious Services: Not on file  . Active Member of Clubs or Organizations: Not on file  . Attends Banker Meetings: Not on file  . Marital Status: Not on file   Past Surgical History:  Procedure Laterality Date  . bilateral tubes placed in ears  04/12/2012  . CARPAL TUNNEL RELEASE  12/15/2011   Procedure: CARPAL TUNNEL RELEASE;  Surgeon: [name], MD;  Location: [facility];  Service: Orthopedics;  Laterality: Left;  [name] MULTIPLE TOOTH EXTRACTIONS    . SHOULDER SURGERY  2012   lt  . ULNAR TUNNEL RELEASE  12/15/2011   Procedure: CUBITAL TUNNEL RELEASE;  Surgeon: [name], MD;  Location: [facility];  Service: Orthopedics;  Laterality: Left;  left cubital tunnel release   Past Surgical History:  Procedure Laterality Date  . bilateral tubes placed in ears  04/12/2012  . CARPAL TUNNEL RELEASE  12/15/2011   Procedure: CARPAL TUNNEL RELEASE;  Surgeon: [name], MD;  Location: [facility];  Service: Orthopedics;  Laterality: Left;  [name] MULTIPLE TOOTH EXTRACTIONS    . SHOULDER SURGERY  2012   lt  . ULNAR TUNNEL RELEASE   12/15/2011   Procedure: CUBITAL TUNNEL RELEASE;  Surgeon: [name], MD;  Location:  [facility];  Service: Orthopedics;  Laterality: Left;  left cubital tunnel release   Past Medical History:  Diagnosis Date  . Allergy   . Chronic pain due to trauma   . Disturbance of skin sensation   . Intercostal neuralgia   . Lumbosacral neuritis   . Myocardial infarction ([facility])   . Rotator cuff (capsule) sprain   . Snores    There were no vitals taken for this visit.  Opioid Risk Score:   Fall Risk Score:  `1  Depression screen PHQ 2/9  Depression screen [facility] 2/9 02/12/2020 01/12/2020 12/15/2019 12/16/2018 11/10/2018 10/19/2018 04/18/2018  Decreased Interest 0 0 0 0 0 0 0  Down, Depressed, Hopeless - 0 0 0 0 0 0  PHQ - 2 Score 0 0 0 0 0 0 0  Altered sleeping - - - - - - -  Tired, decreased energy - - - - - - -  Feeling bad or failure about yourself  - - - - - - -  Trouble concentrating - - - - - - -  Moving slowly or fidgety/restless - - - - - - -  Suicidal thoughts - - - - - - -  PHQ-9 Score - - - - - - -   Review of Systems  Constitutional: Negative.   HENT: Negative.   Eyes: Negative.   Respiratory: Negative.   Cardiovascular: Negative.   Gastrointestinal: Negative.   Endocrine: Negative.   Genitourinary: Negative.   Musculoskeletal: Positive for back pain and gait problem.       Right hip pain  Skin: Negative.   Allergic/Immunologic: Negative.   Hematological: Negative.   Psychiatric/Behavioral: Negative.   All other systems reviewed and are negative.      Objective:   Physical Exam Vitals and nursing note reviewed.  Musculoskeletal:     Comments: No Physical Exam Performed: Telephone Visit           Assessment & Plan:  1. 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 A 12 month History has been reviewed on the [place] Controlled Substance Reporting System11/11/2019. 2. Lumbar Radiculitis: Continuecurrent medication regimen withGabapentin.05/17/2020 3. Intercostal neuralgia: Unable to continue  Flector Patch due to cost, Continuecurrent medication regimen withVoltaren Gel,Robaxin and Neurontin.05/17/2020 4. Insomnia: Continuecurrent medication regime withtrazodone.05/17/2020 5. Muscle Spasm: Continuecurrent medication regimen withRobaxin.05/17/2020 6. Polyarthralgia: Continue current medication regime. Continue to Monitor.05/17/2020 7. Chronic Bilateral Thoracic Pain: Continue HEP and Continue Current medication regimen. Continue to monitor.05/17/2020 8. Chronic Right Hip Pain/ Avascular Necrosis:Mr. Nolasco states he's awaiting a surgery date withDr. Dion Saucier.S/P Hip Injection by Dr. Sherlyn Lees relief noted.05/17/2020 9. Cervical Lymphadenopathy: Endocrinologist Following.  S/P Radioactive Iodine Thyroid Ablation on 05/13/2020,Continue to monitor.05/17/2020   F/U in56month  Telephone Visit: Established Patient Location of Patient: In his Home Location of Provider: In the Office Total Time Spent: 15 Minutes

## 2020-06-14 ENCOUNTER — Encounter: Payer: Medicare HMO | Admitting: Registered Nurse

## 2020-06-18 ENCOUNTER — Encounter: Payer: Self-pay | Admitting: Registered Nurse

## 2020-06-18 ENCOUNTER — Other Ambulatory Visit: Payer: Self-pay

## 2020-06-18 ENCOUNTER — Encounter: Payer: Medicare HMO | Attending: Physical Medicine & Rehabilitation | Admitting: Registered Nurse

## 2020-06-18 VITALS — Ht 69.0 in | Wt 210.0 lb

## 2020-06-18 DIAGNOSIS — M25551 Pain in right hip: Secondary | ICD-10-CM | POA: Diagnosis not present

## 2020-06-18 DIAGNOSIS — M2550 Pain in unspecified joint: Secondary | ICD-10-CM | POA: Insufficient documentation

## 2020-06-18 DIAGNOSIS — M546 Pain in thoracic spine: Secondary | ICD-10-CM | POA: Insufficient documentation

## 2020-06-18 DIAGNOSIS — M5416 Radiculopathy, lumbar region: Secondary | ICD-10-CM | POA: Insufficient documentation

## 2020-06-18 DIAGNOSIS — Z5181 Encounter for therapeutic drug level monitoring: Secondary | ICD-10-CM | POA: Insufficient documentation

## 2020-06-18 DIAGNOSIS — M519 Unspecified thoracic, thoracolumbar and lumbosacral intervertebral disc disorder: Secondary | ICD-10-CM | POA: Insufficient documentation

## 2020-06-18 DIAGNOSIS — G8929 Other chronic pain: Secondary | ICD-10-CM | POA: Insufficient documentation

## 2020-06-18 DIAGNOSIS — M87051 Idiopathic aseptic necrosis of right femur: Secondary | ICD-10-CM

## 2020-06-18 DIAGNOSIS — M8700 Idiopathic aseptic necrosis of unspecified bone: Secondary | ICD-10-CM | POA: Insufficient documentation

## 2020-06-18 DIAGNOSIS — G894 Chronic pain syndrome: Secondary | ICD-10-CM | POA: Diagnosis not present

## 2020-06-18 DIAGNOSIS — Z79891 Long term (current) use of opiate analgesic: Secondary | ICD-10-CM | POA: Insufficient documentation

## 2020-06-18 NOTE — Progress Notes (Signed)
Subjective:    Patient ID: Matthew Sloan, male    DOB: 04/13/1961, 59 y.o.   MRN: [id number]  HPI: UBALDO DAYWALT is a 59 y.o. male whose appointment was changed to a telephone visit, Mr. Mcfayden called the office reporting he's unable to walk due to his right hip. He's awaiting a surgery date, he's still waiting for surgical clearance from his endocrinologist he states. He agrees with telephone visit and verbalizes understanding.Also states his church members are bringing him meals and doing his house cleaning, which he is very appreciative of. He states his pain is located in his mid- lower back radiating into his right hip and right lower extremity. Also reports increase intensity of right hip pain only receiving 3- 4 hours of pain relief with his current medication regimen. We will increase his frequency to 5 times a day as needed for pain on today and 06/19/2020, he was instructed to call office on Thursday 06/20/2020 for an update, he verbalizes understanding. He rates his pain 9. His  current exercise regime is walking short distances to the bathroom in his home and performing stretching exercises in his bed..  Mr. Delpriore Morphine equivalent is 60.00 MME.    Last UDS was Performed on 04/19/2020, it was consistent.    Pain Inventory Average Pain 9 Pain Right Now 9 My pain is constant, sharp, burning, dull, stabbing, tingling and aching  In the last 24 hours, has pain interfered with the following? General activity 10 Relation with others 7 Enjoyment of life 9 What TIME of day is your pain at its worst? morning , daytime, evening and night Sleep (in general) Poor  Pain is worse with: walking, bending, sitting, inactivity, standing and some activites Pain improves with: medication Relief from Meds: 2  Family History  Problem Relation Age of Onset  . Cancer Mother 64       breast cancer  . Heart disease Father 15       CHF   Social History   Socioeconomic History  . Marital  status: Single    Spouse name: Not on file  . Number of children: Not on file  . Years of education: Not on file  . Highest education level: Not on file  Occupational History  . Not on file  Tobacco Use  . Smoking status: Current Every Day Smoker    Packs/day: 1.00    Years: 30.00    Pack years: 30.00    Types: Cigarettes  . Smokeless tobacco: Never Used  . Tobacco comment: working on it-down to 0.5 ppd  Vaping Use  . Vaping Use: Never used  Substance and Sexual Activity  . Alcohol use: No  . Drug use: No  . Sexual activity: Not on file  Other Topics Concern  . Not on file  Social History Narrative  . Not on file   Social Determinants of Health   Financial Resource Strain:   . Difficulty of Paying Living Expenses: Not on file  Food Insecurity:   . Worried About Programme researcher, broadcasting/film/video in the Last Year: Not on file  . Ran Out of Food in the Last Year: Not on file  Transportation Needs:   . Lack of Transportation (Medical): Not on file  . Lack of Transportation (Non-Medical): Not on file  Physical Activity:   . Days of Exercise per Week: Not on file  . Minutes of Exercise per Session: Not on file  Stress:   . Feeling of Stress :  Not on file  Social Connections:   . Frequency of Communication with Friends and Family: Not on file  . Frequency of Social Gatherings with Friends and Family: Not on file  . Attends Religious Services: Not on file  . Active Member of Clubs or Organizations: Not on file  . Attends Banker Meetings: Not on file  . Marital Status: Not on file   Past Surgical History:  Procedure Laterality Date  . bilateral tubes placed in ears  04/12/2012  . CARPAL TUNNEL RELEASE  12/15/2011   Procedure: CARPAL TUNNEL RELEASE;  Surgeon: [name], MD;  Location: [facility];  Service: Orthopedics;  Laterality: Left;  [name] MULTIPLE TOOTH EXTRACTIONS    . SHOULDER SURGERY  2012   lt  . ULNAR TUNNEL RELEASE  12/15/2011   Procedure: CUBITAL  TUNNEL RELEASE;  Surgeon: [name], MD;  Location: [facility];  Service: Orthopedics;  Laterality: Left;  left cubital tunnel release   Past Surgical History:  Procedure Laterality Date  . bilateral tubes placed in ears  04/12/2012  . CARPAL TUNNEL RELEASE  12/15/2011   Procedure: CARPAL TUNNEL RELEASE;  Surgeon: [name], MD;  Location: [facility];  Service: Orthopedics;  Laterality: Left;  [name] MULTIPLE TOOTH EXTRACTIONS    . SHOULDER SURGERY  2012   lt  . ULNAR TUNNEL RELEASE  12/15/2011   Procedure: CUBITAL TUNNEL RELEASE;  Surgeon: [name], MD;  Location: [facility];  Service: Orthopedics;  Laterality: Left;  left cubital tunnel release   Past Medical History:  Diagnosis Date  . Allergy   . Chronic pain due to trauma   . Disturbance of skin sensation   . Intercostal neuralgia   . Lumbosacral neuritis   . Myocardial infarction (HCC)   . Rotator cuff (capsule) sprain   . Snores    Ht 5\' 9"  (1.753 m)   Wt 210 lb (95.3 kg)   BMI 31.01 kg/m   Opioid Risk Score:   Fall Risk Score:  `1  Depression screen PHQ 2/9  Depression screen [facility] 2/9 05/17/2020 02/12/2020 01/12/2020 12/15/2019 12/16/2018 11/10/2018 10/19/2018  Decreased Interest 0 0 0 0 0 0 0  Down, Depressed, Hopeless 0 - 0 0 0 0 0  PHQ - 2 Score 0 0 0 0 0 0 0  Altered sleeping - - - - - - -  Tired, decreased energy - - - - - - -  Feeling bad or failure about yourself  - - - - - - -  Trouble concentrating - - - - - - -  Moving slowly or fidgety/restless - - - - - - -  Suicidal thoughts - - - - - - -  PHQ-9 Score - - - - - - -    Review of Systems  Constitutional: Negative.   HENT: Negative.   Eyes: Negative.   Respiratory: Negative.   Cardiovascular: Negative.   Gastrointestinal: Negative.   Endocrine: Negative.   Genitourinary: Negative.   Musculoskeletal: Positive for arthralgias, back pain and gait problem.  Skin: Negative.   Allergic/Immunologic: Negative.     Neurological:       Tingling   Hematological: Negative.   Psychiatric/Behavioral: Negative.   All other systems reviewed and are negative.      Objective:   Physical Exam Vitals and nursing note reviewed.  Musculoskeletal:     Comments: No Physical Exam Performed: Telephone Visit  Assessment & Plan:  1. 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 Klausing will call office on Thursday 12/09/202, for an update on medication change. .  We will continue the opioid monitoring program, this consists of regular clinic visits, examinations, urine drug screen, pill counts as well as use of [place] Controlled Substance Reporting system. A 12 month History has been reviewed on the [place] Controlled Substance Reporting System12/01/2020. 2. Lumbar Radiculitis: Continuecurrent medication regimen withGabapentin.06/18/2020 3. Intercostal neuralgia: Unable to continue Flector Patch due to cost, Continuecurrent medication regimen withVoltaren Gel,Robaxin and Neurontin.06/18/2020 4. Insomnia: Continuecurrent medication regime withtrazodone.06/18/2020 5. Muscle Spasm: Continuecurrent medication regimen withRobaxin.06/18/2020 6. Polyarthralgia: Continue current medication regime. Continue to Monitor.06/18/2020 7. Chronic Bilateral Thoracic Pain: Continue HEP and Continue Current medication regimen. Continue to monitor.06/18/2020 8. Chronic Right Hip Pain/ Avascular Necrosis:Mr. Kroeze states he's awaiting a surgery date withDr. Dion Saucier.S/P Hip Injection by Dr. Sherlyn Lees relief noted.06/18/2020 9. Cervical Lymphadenopathy: Endocrinologist Following.  S/P Radioactive Iodine Thyroid Ablation on 05/13/2020,Continue to monitor.06/18/2020   F/U  in57month  Telephone Visit: Established Patient Location of Patient: In his Home Location of Provider: In the Office Total Time Spent: 16 Minutes

## 2020-06-20 ENCOUNTER — Telehealth: Payer: Self-pay | Admitting: Registered Nurse

## 2020-06-20 MED ORDER — OXYCODONE HCL 10 MG PO TABS: 10.0000 mg | ORAL_TABLET | Freq: Every day | ORAL | 0 refills | Status: DC | PRN

## 2020-06-20 NOTE — Telephone Encounter (Signed)
Return Matthew Sloan call, he reports  The medication change with oxycodone has offered him some relief of his pain. We will continue to monitor. Matthew Sloan will call with surgical date.

## 2020-06-20 NOTE — Telephone Encounter (Signed)
Patient was told to call Matthew Sloan back today.  Please call him at [phone].

## 2020-07-03 ENCOUNTER — Ambulatory Visit: Payer: Medicare HMO | Admitting: "Endocrinology

## 2020-07-19 ENCOUNTER — Other Ambulatory Visit: Payer: Self-pay

## 2020-07-19 ENCOUNTER — Encounter: Payer: Medicare HMO | Attending: Physical Medicine & Rehabilitation | Admitting: Registered Nurse

## 2020-07-19 ENCOUNTER — Encounter: Payer: Self-pay | Admitting: Registered Nurse

## 2020-07-19 VITALS — Ht 69.0 in | Wt 210.0 lb

## 2020-07-19 DIAGNOSIS — M546 Pain in thoracic spine: Secondary | ICD-10-CM | POA: Diagnosis not present

## 2020-07-19 DIAGNOSIS — M25551 Pain in right hip: Secondary | ICD-10-CM | POA: Insufficient documentation

## 2020-07-19 DIAGNOSIS — G8929 Other chronic pain: Secondary | ICD-10-CM | POA: Insufficient documentation

## 2020-07-19 DIAGNOSIS — Z79891 Long term (current) use of opiate analgesic: Secondary | ICD-10-CM | POA: Insufficient documentation

## 2020-07-19 DIAGNOSIS — M519 Unspecified thoracic, thoracolumbar and lumbosacral intervertebral disc disorder: Secondary | ICD-10-CM | POA: Diagnosis not present

## 2020-07-19 DIAGNOSIS — M5416 Radiculopathy, lumbar region: Secondary | ICD-10-CM | POA: Insufficient documentation

## 2020-07-19 DIAGNOSIS — Z5181 Encounter for therapeutic drug level monitoring: Secondary | ICD-10-CM | POA: Insufficient documentation

## 2020-07-19 DIAGNOSIS — M8700 Idiopathic aseptic necrosis of unspecified bone: Secondary | ICD-10-CM | POA: Insufficient documentation

## 2020-07-19 DIAGNOSIS — G894 Chronic pain syndrome: Secondary | ICD-10-CM | POA: Insufficient documentation

## 2020-07-19 DIAGNOSIS — M2550 Pain in unspecified joint: Secondary | ICD-10-CM | POA: Insufficient documentation

## 2020-07-19 MED ORDER — OXYCODONE HCL 10 MG PO TABS: 10.0000 mg | ORAL_TABLET | Freq: Every day | ORAL | 0 refills | Status: DC | PRN

## 2020-07-19 NOTE — Progress Notes (Addendum)
Subjective:    Patient ID: Matthew Sloan, male    DOB: 06/26/1961, 60 y.o.   MRN: [id number]  HPI: Matthew Sloan is a 60 y.o. male whose appointment appointment was changed to a telephone office visit, Matthew Sloan reports he has a headache for the last 4 days, diarrhea and muscle aches. He hasn't had a COVID test yet, he reached out to PCP to see about someone coming to his home to give him a COVID-19 Test. He was instructed to call his PCP again regarding the above and to reach out to family and church members, he verbalizes understanding. He's also has decreased mobility due to his right hip pain, still awaiting hip replacement with Dr Mardelle Matte. Matthew Sloan is awaiting surgical clearance due to his Hyperthyroidism and Graves Disease, endocrinologist following.   The telephone visit will also provide continuity of care. Matthew Sloan agrees with telephone visit and verbalizes understanding. He states his pain is located in his left shoulder, lower back pain radiating into his right hip and right lower extremity. Also reports mid- back pain and generalized joint pain. He rates his pain 10. His current exercise regime is walking short distances in his home with [place].   Matthew Sloan Morphine equivalent is 75.00 MME.    Last UDS was performed on 04/19/2020, it was consistent.   Geryl Rankins CMA asked The health and History Questions. This provider and Geryl Rankins verified we were speaking with the correct person using two identifiers.   Pain Inventory Average Pain 10 Pain Right Now 10 My pain is constant  In the last 24 hours, has pain interfered with the following? General activity 10 Relation with others 10 Enjoyment of life 10 What TIME of day is your pain at its worst? morning , daytime, evening and night Sleep (in general) Poor  Pain is worse with: walking, bending, standing and some activites Pain improves with: medication Relief from Meds: 2  Family History  Problem Relation Age  of Onset  . Cancer Mother 74       breast cancer  . Heart disease Father 20       CHF   Social History   Socioeconomic History  . Marital status: Single    Spouse name: Not on file  . Number of children: Not on file  . Years of education: Not on file  . Highest education level: Not on file  Occupational History  . Not on file  Tobacco Use  . Smoking status: Current Every Day Smoker    Packs/day: 1.00    Years: 30.00    Pack years: 30.00    Types: Cigarettes  . Smokeless tobacco: Never Used  . Tobacco comment: working on it-down to 0.5 ppd  Vaping Use  . Vaping Use: Never used  Substance and Sexual Activity  . Alcohol use: No  . Drug use: No  . Sexual activity: Not on file  Other Topics Concern  . Not on file  Social History Narrative  . Not on file   Social Determinants of Health   Financial Resource Strain: Not on file  Food Insecurity: Not on file  Transportation Needs: Not on file  Physical Activity: Not on file  Stress: Not on file  Social Connections: Not on file   Past Surgical History:  Procedure Laterality Date  . bilateral tubes placed in ears  04/12/2012  . CARPAL TUNNEL RELEASE  12/15/2011   Procedure: CARPAL TUNNEL RELEASE;  Surgeon: Tennis Must, MD;  Location: [facility];  Service: Orthopedics;  Laterality: Left;  [name] MULTIPLE TOOTH EXTRACTIONS    . SHOULDER SURGERY  2012   lt  . ULNAR TUNNEL RELEASE  12/15/2011   Procedure: CUBITAL TUNNEL RELEASE;  Surgeon: [name], MD;  Location: [facility];  Service: Orthopedics;  Laterality: Left;  left cubital tunnel release   Past Surgical History:  Procedure Laterality Date  . bilateral tubes placed in ears  04/12/2012  . CARPAL TUNNEL RELEASE  12/15/2011   Procedure: CARPAL TUNNEL RELEASE;  Surgeon: [name], MD;  Location: [facility];  Service: Orthopedics;  Laterality: Left;  [name] MULTIPLE TOOTH EXTRACTIONS    . SHOULDER SURGERY  2012   lt  . ULNAR  TUNNEL RELEASE  12/15/2011   Procedure: CUBITAL TUNNEL RELEASE;  Surgeon: [name], MD;  Location: [facility];  Service: Orthopedics;  Laterality: Left;  left cubital tunnel release   Past Medical History:  Diagnosis Date  . Allergy   . Chronic pain due to trauma   . Disturbance of skin sensation   . Intercostal neuralgia   . Lumbosacral neuritis   . Myocardial infarction (HCC)   . Rotator cuff (capsule) sprain   . Snores    Ht 5\' 9"  (1.753 m)   Wt 210 lb (95.3 kg)   BMI 31.01 kg/m   Opioid Risk Score:   Fall Risk Score:  `1  Depression screen PHQ 2/9  Depression screen [facility] 2/9 05/17/2020 02/12/2020 01/12/2020 12/15/2019 12/16/2018 11/10/2018 10/19/2018  Decreased Interest 0 0 0 0 0 0 0  Down, Depressed, Hopeless 0 - 0 0 0 0 0  PHQ - 2 Score 0 0 0 0 0 0 0  Altered sleeping - - - - - - -  Tired, decreased energy - - - - - - -  Feeling bad or failure about yourself  - - - - - - -  Trouble concentrating - - - - - - -  Moving slowly or fidgety/restless - - - - - - -  Suicidal thoughts - - - - - - -  PHQ-9 Score - - - - - - -    Review of Systems  Constitutional: Positive for fever.  HENT: Negative.   Eyes: Negative.   Respiratory: Negative.   Cardiovascular: Negative.   Gastrointestinal: Negative.   Endocrine: Negative.   Genitourinary: Negative.   Musculoskeletal: Positive for arthralgias and gait problem.  Skin: Negative.   Allergic/Immunologic: Negative.   Neurological: Positive for headaches.  Hematological: Negative.   Psychiatric/Behavioral: Negative.   All other systems reviewed and are negative.      Objective:   Physical Exam Vitals and nursing note reviewed.  Musculoskeletal:     Comments: No Physical Exam Performed: Telephone Visit           Assessment & Plan:  1. 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  We will continue the opioid monitoring program, this consists of regular clinic visits, examinations, urine drug screen, pill counts as well as use of 09/16/2020 Controlled Substance Reporting system. A 12 month History has been reviewed on the [place] Controlled Substance Reporting System01/01/2021. 2. Lumbar Radiculitis: Continuecurrent medication regimen withGabapentin.07/19/2020 3. Intercostal neuralgia: Unable to continue Flector Patch due to cost,  Continuecurrent medication regimen withVoltaren Gel,Robaxin and Neurontin.07/19/2020. 4. Insomnia: Continuecurrent medication regime withtrazodone.07/19/2020 5. Muscle Spasm: Continuecurrent medication regimen withRobaxin.07/19/2020 6. Polyarthralgia: Continue current medication regime. Continue to Monitor.07/19/2020 7. Chronic Bilateral Thoracic Pain: Continue HEP and Continue Current medication regimen. Continue to monitor.07/19/2020 8. Chronic Right Hip Pain/ Avascular Necrosis:Matthew Sloan states he's awaiting a surgery date withDr. Dion Saucier.S/P Hip Injection by Dr. Sherlyn Lees relief noted.07/19/2020 9. Cervical Lymphadenopathy: Endocrinologist Following. S/P Radioactive Iodine Thyroid Ablation on 05/13/2020,Continue to monitor.07/19/2020   F/U in13month  Telephone Visit: Established Patient Location of Patient: In his Home Location of Provider: In the Office Total Time Spent: 10 Minutes

## 2020-08-08 LAB — THYROGLOBULIN ANTIBODY: Thyroglobulin Antibody: >2250 IU/mL — AB (ref 0.0–0.9)

## 2020-08-08 LAB — TSH: TSH: 14.9 u[IU]/mL — ABNORMAL HIGH (ref 0.450–4.500)

## 2020-08-08 LAB — T4, FREE: Free T4: 0.26 ng/dL — ABNORMAL LOW (ref 0.82–1.77)

## 2020-08-08 LAB — T3, FREE: T3, Free: 1.7 pg/mL — ABNORMAL LOW (ref 2.0–4.4)

## 2020-08-08 LAB — THYROID PEROXIDASE ANTIBODY: Thyroperoxidase Ab SerPl-aCnc: >600 IU/mL — ABNORMAL HIGH (ref 0–34)

## 2020-08-14 ENCOUNTER — Other Ambulatory Visit: Payer: Self-pay

## 2020-08-14 ENCOUNTER — Ambulatory Visit (INDEPENDENT_AMBULATORY_CARE_PROVIDER_SITE_OTHER): Payer: Medicare HMO | Admitting: "Endocrinology

## 2020-08-14 ENCOUNTER — Encounter: Payer: Self-pay | Admitting: "Endocrinology

## 2020-08-14 VITALS — BP 126/84 | HR 76 | Ht 69.0 in | Wt 239.2 lb

## 2020-08-14 DIAGNOSIS — E890 Postprocedural hypothyroidism: Secondary | ICD-10-CM

## 2020-08-14 MED ORDER — LEVOTHYROXINE SODIUM 100 MCG PO TABS: 100.0000 ug | ORAL_TABLET | Freq: Every day | ORAL | 1 refills | Status: DC

## 2020-08-14 NOTE — Progress Notes (Signed)
08/14/2020          Endocrinology follow-up note   Subjective:    Patient ID: Matthew Sloan, male    DOB: [birth year], PCP Renee Ramus, MD.   Past Medical History:  Diagnosis Date  . Allergy   . Chronic pain due to trauma   . Disturbance of skin sensation   . Intercostal neuralgia   . Lumbosacral neuritis   . Myocardial infarction ([place])   . Rotator cuff (capsule) sprain   . Snores     Past Surgical History:  Procedure Laterality Date  . bilateral tubes placed in ears  04/12/2012  . CARPAL TUNNEL RELEASE  12/15/2011   Procedure: CARPAL TUNNEL RELEASE;  Surgeon: Tennis Must, MD;  Location: [facility];  Service: Orthopedics;  Laterality: Left;  [name] MULTIPLE TOOTH EXTRACTIONS    . SHOULDER SURGERY  2012   lt  . ULNAR TUNNEL RELEASE  12/15/2011   Procedure: CUBITAL TUNNEL RELEASE;  Surgeon: Tennis Must, MD;  Location: [facility];  Service: Orthopedics;  Laterality: Left;  left cubital tunnel release    Social History   Socioeconomic History  . Marital status: Single    Spouse name: Not on file  . Number of children: Not on file  . Years of education: Not on file  . Highest education level: Not on file  Occupational History  . Not on file  Tobacco Use  . Smoking status: Current Every Day Smoker    Packs/day: 1.00    Years: 30.00    Pack years: 30.00    Types: Cigarettes  . Smokeless tobacco: Never Used  . Tobacco comment: working on it-down to 0.5 ppd  Vaping Use  . Vaping Use: Never used  Substance and Sexual Activity  . Alcohol use: No  . Drug use: No  . Sexual activity: Not on file  Other Topics Concern  . Not on file  Social History Narrative  . Not on file   Social Determinants of Health   Financial Resource Strain: Not on file  Food Insecurity: Not on file  Transportation Needs: Not on file  Physical Activity: Not on file  Stress: Not on file  Social Connections: Not on file    Family History  Problem Relation  Age of Onset  . Cancer Mother 19       breast cancer  . Heart disease Father 55       CHF    Outpatient Encounter Medications as of 08/14/2020  Medication Sig  . levothyroxine (SYNTHROID) 100 MCG tablet Take 1 tablet (100 mcg total) by mouth daily before breakfast.  . albuterol (VENTOLIN HFA) 108 (90 Base) MCG/ACT inhaler Inhale 2 puffs into the lungs as needed.  [name] apixaban (ELIQUIS) 5 MG TABS tablet Take 5 mg by mouth 2 (two) times daily.  [name] BREO ELLIPTA 200-25 MCG/INH AEPB Inhale 1 puff into the lungs daily.  [name] gabapentin (NEURONTIN) 800 MG tablet Take 1 tablet (800 mg total) by mouth 3 (three) times daily.  . methocarbamol (ROBAXIN) 500 MG tablet Take 1 tablet (500 mg total) by mouth 3 (three) times daily.  . metoprolol tartrate (LOPRESSOR) 25 MG tablet Take 25 mg by mouth 2 (two) times daily.  . Oxycodone HCl 10 MG TABS Take 1 tablet (10 mg total) by mouth 5 (five) times daily as needed.  . pravastatin (PRAVACHOL) 20 MG tablet Take 20 mg by mouth at bedtime.  . pravastatin (PRAVACHOL) 40 MG tablet Take  1 tablet by mouth at bedtime.  . promethazine (PHENERGAN) 12.5 MG tablet Take 12.5 mg by mouth 2 (two) times daily as needed.  . traZODone (DESYREL) 100 MG tablet TAKE 1 TABLET (100 MG TOTAL) BY MOUTH AT BEDTIME. (Patient taking differently: TAKE 1 TABLET (100 MG TOTAL) BY MOUTH AT BEDTIME. AS NEEDED)   No facility-administered encounter medications on file as of 08/14/2020.    ALLERGIES: Allergies  Allergen Reactions  . Penicillins     VACCINATION STATUS:  There is no immunization history on file for this patient.   HPI  Matthew Sloan is 60 y.o. male who is presenting to follow-up after he was given radioactive iodine for treatment of hyperthyroidism from Graves' disease.    Referral requested by  Renee Ramus, MD. -Patient was seen October 2020 in consult, did not return for follow-up visits.  Interval studies were reviewed and consistent with primary hyperthyroidism  from Graves' disease.    He was given radioactive iodine therapy in November 2021, was supposed to return in December, could not return for various reasons. His previsit thyroid function tests are consistent with treatment effect with RAI induced hypothyroidism.  He is not initiated on thyroid hormone yet.  He reports fatigue, sleepiness.  He denies dysphagia, choking, shortness of breath. He is known to have goiter with no discrete nodules. -He denies recent voice change.  He is a chronic active smoker.   he denies family history of thyroid dysfunction.  He denies  family hx of thyroid cancer.                            Review of systems As above.   Objective:    BP 126/84   Pulse 76   Ht '5\' 9"'  (1.753 m)   Wt 239 lb 3.2 oz (108.5 kg)   BMI 35.32 kg/m   Wt Readings from Last 3 Encounters:  08/14/20 239 lb 3.2 oz (108.5 kg)  07/19/20 210 lb (95.3 kg)  06/18/20 210 lb (95.3 kg)        Neck exam: Thyroid generally smaller than last exam.  No gross palpable goiter.                  CMP     Component Value Date/Time   NA 139 01/03/2010 0545   K 4.3 01/03/2010 0545   CL 103 01/03/2010 0545   CO2 26 01/03/2010 0545   GLUCOSE 104 (H) 01/03/2010 0545   BUN 14 01/03/2010 0545   CREATININE 0.89 01/03/2010 0545   CALCIUM 8.7 01/03/2010 0545   PROT 6.7 10/10/2012 1031   ALBUMIN 4.3 10/10/2012 1031   AST 18 10/10/2012 1031   ALT 30 10/10/2012 1031   ALKPHOS 50 10/10/2012 1031   BILITOT 0.4 10/10/2012 1031   GFRNONAA >60 01/03/2010 0545   GFRAA  01/03/2010 0545    >60        The eGFR has been calculated using the MDRD equation. This calculation has not been validated in all clinical situations. eGFR's persistently <60 mL/min signify possible Chronic Kidney Disease.     CBC    Component Value Date/Time   WBC 8.6 01/07/2010 0945   RBC 3.81 (L) 01/07/2010 0945   HGB 15.7 12/15/2011 0809   HCT 33.9 (L) 01/07/2010 0945   PLT 514 (H) 01/07/2010 0945   MCV 89.1  01/07/2010 0945   MCH 29.6 01/07/2010 0945   MCHC 33.3 01/07/2010 0945  RDW 13.6 01/07/2010 0945   LYMPHSABS 2.3 01/07/2010 0945   MONOABS 0.6 01/07/2010 0945   EOSABS 0.7 01/07/2010 0945   BASOSABS 0.1 01/07/2010 0945   Recent Results (from the past 2160 hour(s))  TSH     Status: Abnormal   Collection Time: 08/07/20  3:55 PM  Result Value Ref Range   TSH 14.900 (H) 0.450 - 4.500 uIU/mL  T4, free     Status: Abnormal   Collection Time: 08/07/20  3:55 PM  Result Value Ref Range   Free T4 0.26 (L) 0.82 - 1.77 ng/dL  T3, free     Status: Abnormal   Collection Time: 08/07/20  3:55 PM  Result Value Ref Range   T3, Free 1.7 (L) 2.0 - 4.4 pg/mL  Thyroid peroxidase antibody     Status: Abnormal   Collection Time: 08/07/20  3:55 PM  Result Value Ref Range   Thyroperoxidase Ab SerPl-aCnc >600 (H) 0 - 34 IU/mL  Thyroglobulin antibody     Status: Abnormal   Collection Time: 08/07/20  3:55 PM  Result Value Ref Range   Thyroglobulin Antibody >2250.0 (A) 0.0 - 0.9 IU/mL    Comment: Thyroglobulin Antibody measured by [facility] Methodology    April 24, 2020 thyroid uptake and scan: 24-hour uptake 56.9%.  Normal range 15 to 35%.  Previously, the 24-hour uptake was similarly elevated to 57.3%. Impression: Homogeneously increased uptake within an enlarged thyroid gland, is suggestive of Graves' disease.     Assessment & Plan:   1.  RAI induced hypothyroidism  2.  Graves' disease-resolved See notes from prior visits.  He is status post RAI thyroid ablation for clinically significant hyperthyroidism from Graves' disease. His treatment effect and now he is clinically hypothyroid.  I discussed initiated thyroid hormone replacement. -To start, he is given levothyroxine 100 mcg p.o. daily before breakfast.   - We discussed about the correct intake of his thyroid hormone, on empty stomach at fasting, with water, separated by at least 30 minutes from breakfast and other medications,  and  separated by more than 4 hours from calcium, iron, multivitamins, acid reflux medications (PPIs). -Patient is made aware of the fact that thyroid hormone replacement is needed for life, dose to be adjusted by periodic monitoring of thyroid function tests.  There is apparent reduction in size of his thyroid as a result of radioactive iodine treatment.   -He is already on metoprolol 25 mg p.o. twice daily, advised to be consistent taking his medication, did not initiate any additional beta-blockers.   - I advised him to maintain close follow up with Renee Ramus, MD for primary care needs.     - Time spent on this patient care encounter:  32 minutes of which 50% was spent in  counseling and the rest reviewing  his current and  previous labs / studies and medications  doses and developing a plan for long term care. Matthew Sloan  participated in the discussions, expressed understanding, and voiced agreement with the above plans.  All questions were answered to his satisfaction. he is encouraged to contact clinic should he have any questions or concerns prior to his return visit.    Follow up plan: Return in about 7 weeks (around 10/02/2020) for F/U with Pre-visit Labs.   Thank you for involving me in the care of this pleasant patient, and I will continue to update you with his progress.  Glade Lloyd, MD [facility] Phone: [phone]  Fax: [phone]   08/14/2020, 12:44 PM  This note was partially dictated with voice recognition software. Similar sounding words can be transcribed inadequately or may not  be corrected upon review.

## 2020-08-16 ENCOUNTER — Other Ambulatory Visit: Payer: Self-pay

## 2020-08-16 ENCOUNTER — Encounter: Payer: Medicare HMO | Attending: Physical Medicine & Rehabilitation | Admitting: Registered Nurse

## 2020-08-16 ENCOUNTER — Encounter: Payer: Self-pay | Admitting: Registered Nurse

## 2020-08-16 VITALS — BP 126/84 | HR 76 | Ht 69.0 in | Wt 227.0 lb

## 2020-08-16 DIAGNOSIS — M5416 Radiculopathy, lumbar region: Secondary | ICD-10-CM | POA: Diagnosis not present

## 2020-08-16 DIAGNOSIS — G894 Chronic pain syndrome: Secondary | ICD-10-CM | POA: Insufficient documentation

## 2020-08-16 DIAGNOSIS — M25551 Pain in right hip: Secondary | ICD-10-CM | POA: Insufficient documentation

## 2020-08-16 DIAGNOSIS — M546 Pain in thoracic spine: Secondary | ICD-10-CM | POA: Insufficient documentation

## 2020-08-16 DIAGNOSIS — Z79891 Long term (current) use of opiate analgesic: Secondary | ICD-10-CM | POA: Insufficient documentation

## 2020-08-16 DIAGNOSIS — M2550 Pain in unspecified joint: Secondary | ICD-10-CM | POA: Insufficient documentation

## 2020-08-16 DIAGNOSIS — M8700 Idiopathic aseptic necrosis of unspecified bone: Secondary | ICD-10-CM | POA: Insufficient documentation

## 2020-08-16 DIAGNOSIS — M519 Unspecified thoracic, thoracolumbar and lumbosacral intervertebral disc disorder: Secondary | ICD-10-CM | POA: Insufficient documentation

## 2020-08-16 DIAGNOSIS — Z5181 Encounter for therapeutic drug level monitoring: Secondary | ICD-10-CM | POA: Insufficient documentation

## 2020-08-16 DIAGNOSIS — G8929 Other chronic pain: Secondary | ICD-10-CM | POA: Insufficient documentation

## 2020-08-16 MED ORDER — OXYCODONE HCL 10 MG PO TABS: 10.0000 mg | ORAL_TABLET | Freq: Every day | ORAL | 0 refills | Status: DC | PRN

## 2020-08-16 NOTE — Progress Notes (Signed)
Subjective:    Patient ID: Matthew Sloan, male    DOB: October 19, 1960, 60 y.o.   MRN: [id number]  HPI: Matthew Sloan is a 61 y.o. male whose appointment was changed to a telephone visit, due to transportation and increase intensity of  Right hip pain, he's awaiting surgery. He states  His pain is located in his mid- lower back radiating into his bilateral hips R>L, Also reports generalized joint pain all over. He rates his pain 10. His current exercise regime is walking to the bathroom otherwise he states he is bed bound.   [name] knows he has to come to the office for his next visit and verbalizes understanding.   [name] is 75.00 MME.  Last UDS was Performed on 04/19/2020, it was consistent.   Suezanne Jacquet asked The Health and History Questions this provider and Sybil verified we were speaking with the correct person using two identifiers.   Pain Inventory Average Pain 10 Pain Right Now 10 My pain is constant, sharp, burning, dull, stabbing, tingling and aching  In the last 24 hours, has pain interfered with the following? General activity 10 Relation with others 10 Enjoyment of life 10 What TIME of day is your pain at its worst? morning , daytime, evening and night Sleep (in general) Poor  Pain is worse with: walking, bending, sitting, standing and sleeping Pain improves with: medication Relief from Meds: 1  Family History  Problem Relation Age of Onset  . Cancer Mother 36       breast cancer  . Heart disease Father 24       CHF   Social History   Socioeconomic History  . Marital status: Single    Spouse name: Not on file  . Number of children: Not on file  . Years of education: Not on file  . Highest education level: Not on file  Occupational History  . Not on file  Tobacco Use  . Smoking status: Current Every Day Smoker    Packs/day: 1.00    Years: 30.00    Pack years: 30.00    Types: Cigarettes  . Smokeless tobacco: Never Used  .  Tobacco comment: working on it-down to 0.5 ppd  Vaping Use  . Vaping Use: Never used  Substance and Sexual Activity  . Alcohol use: No  . Drug use: No  . Sexual activity: Not on file  Other Topics Concern  . Not on file  Social History Narrative  . Not on file   Social Determinants of Health   Financial Resource Strain: Not on file  Food Insecurity: Not on file  Transportation Needs: Not on file  Physical Activity: Not on file  Stress: Not on file  Social Connections: Not on file   Past Surgical History:  Procedure Laterality Date  . bilateral tubes placed in ears  04/12/2012  . CARPAL TUNNEL RELEASE  12/15/2011   Procedure: CARPAL TUNNEL RELEASE;  Surgeon: [name], MD;  Location: [facility];  Service: Orthopedics;  Laterality: Left;  [name] MULTIPLE TOOTH EXTRACTIONS    . SHOULDER SURGERY  2012   lt  . ULNAR TUNNEL RELEASE  12/15/2011   Procedure: CUBITAL TUNNEL RELEASE;  Surgeon: [name], MD;  Location: [facility];  Service: Orthopedics;  Laterality: Left;  left cubital tunnel release   Past Surgical History:  Procedure Laterality Date  . bilateral tubes placed in ears  04/12/2012  . CARPAL TUNNEL RELEASE  12/15/2011   Procedure: CARPAL TUNNEL RELEASE;  Surgeon: [name], MD;  Location: [facility];  Service: Orthopedics;  Laterality: Left;  [name] MULTIPLE TOOTH EXTRACTIONS    . SHOULDER SURGERY  2012   lt  . ULNAR TUNNEL RELEASE  12/15/2011   Procedure: CUBITAL TUNNEL RELEASE;  Surgeon: [name], MD;  Location: [facility];  Service: Orthopedics;  Laterality: Left;  left cubital tunnel release   Past Medical History:  Diagnosis Date  . Allergy   . Chronic pain due to trauma   . Disturbance of skin sensation   . Intercostal neuralgia   . Lumbosacral neuritis   . Myocardial infarction ([facility])   . Rotator cuff (capsule) sprain   . Snores    BP 126/84 Comment: last recorded  Pulse 76 Comment: last  recorded  Ht 5\' 9"  (1.753 m) Comment: last recorded  Wt 227 lb (103 kg) Comment: pt reported  BMI 33.52 kg/m   Opioid Risk Score:   Fall Risk Score:  `1  Depression screen PHQ 2/9  Depression screen [facility] 2/9 08/16/2020 05/17/2020 02/12/2020 01/12/2020 12/15/2019 12/16/2018 11/10/2018  Decreased Interest 0 0 0 0 0 0 0  Down, Depressed, Hopeless 0 0 - 0 0 0 0  PHQ - 2 Score 0 0 0 0 0 0 0  Altered sleeping - - - - - - -  Tired, decreased energy - - - - - - -  Feeling bad or failure about yourself  - - - - - - -  Trouble concentrating - - - - - - -  Moving slowly or fidgety/restless - - - - - - -  Suicidal thoughts - - - - - - -  PHQ-9 Score - - - - - - -    Review of Systems  Constitutional: Negative.   HENT: Negative.   Respiratory: Negative.   Cardiovascular: Negative.   Gastrointestinal: Negative.   Endocrine: Negative.   Genitourinary: Negative.   Musculoskeletal: Positive for back pain.       Hip  Skin: Negative.   Allergic/Immunologic: Negative.   Neurological: Negative.   Hematological: Bruises/bleeds easily.       Eliquis  Psychiatric/Behavioral: Negative.   All other systems reviewed and are negative.      Objective:   Physical Exam Vitals and nursing note reviewed.  Musculoskeletal:     Comments: No Physical Exam Performed: Telephone Visit           Assessment & Plan:  1. 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  We will continue the opioid monitoring program, this consists of regular clinic visits, examinations, urine drug screen, pill counts as well as use of 10/14/2020 Controlled Substance Reporting system. A 12 month History has been reviewed on the [place] Controlled Substance Reporting System02/10/2020. 2. Lumbar Radiculitis:  Continuecurrent medication regimen withGabapentin.08/16/2020 3. Intercostal neuralgia: Unable to continue Flector Patch due to cost, Continuecurrent medication regimen withVoltaren Gel,Robaxin and Neurontin.08/16/2020. 4. Insomnia: Continuecurrent medication regime withtrazodone.08/16/2020 5. Muscle Spasm: Continuecurrent medication regimen withRobaxin.08/16/2020 6. Polyarthralgia: Continue current medication regime. Continue to Monitor.08/16/2020 7. Chronic Bilateral Thoracic Pain: Continue HEP and Continue Current medication regimen. Continue to monitor.08/16/2020 8. Chronic Right Hip Pain/ Avascular Necrosis:Mr. Hilligoss states he's awaiting a surgery date withDr. 10/14/2020.S/P Hip Injection by Dr. Dion Saucier relief noted.08/16/2020 9.  Cervical Lymphadenopathy: Endocrinologist Following. S/P Radioactive Iodine Thyroid Ablation on 05/13/2020,Continue to monitor.08/16/2020   F/U in44month  Telephone Visit: Established Patient Location of Patient: In his Home Location of Provider: In the Office Total Time Spent: Supervising Physician: Dr Wynn Banker

## 2020-09-13 ENCOUNTER — Other Ambulatory Visit: Payer: Self-pay

## 2020-09-13 ENCOUNTER — Encounter: Payer: Self-pay | Admitting: Registered Nurse

## 2020-09-13 ENCOUNTER — Encounter: Payer: Medicare HMO | Attending: Physical Medicine & Rehabilitation | Admitting: Registered Nurse

## 2020-09-13 VITALS — BP 136/86 | HR 78 | Temp 98.5°F | Ht 69.0 in | Wt 236.2 lb

## 2020-09-13 DIAGNOSIS — Z79891 Long term (current) use of opiate analgesic: Secondary | ICD-10-CM | POA: Diagnosis not present

## 2020-09-13 DIAGNOSIS — M2550 Pain in unspecified joint: Secondary | ICD-10-CM | POA: Diagnosis present

## 2020-09-13 DIAGNOSIS — G8929 Other chronic pain: Secondary | ICD-10-CM | POA: Diagnosis present

## 2020-09-13 DIAGNOSIS — M546 Pain in thoracic spine: Secondary | ICD-10-CM | POA: Insufficient documentation

## 2020-09-13 DIAGNOSIS — G894 Chronic pain syndrome: Secondary | ICD-10-CM | POA: Insufficient documentation

## 2020-09-13 DIAGNOSIS — M8700 Idiopathic aseptic necrosis of unspecified bone: Secondary | ICD-10-CM | POA: Diagnosis present

## 2020-09-13 DIAGNOSIS — M5416 Radiculopathy, lumbar region: Secondary | ICD-10-CM | POA: Insufficient documentation

## 2020-09-13 DIAGNOSIS — Z5181 Encounter for therapeutic drug level monitoring: Secondary | ICD-10-CM | POA: Insufficient documentation

## 2020-09-13 DIAGNOSIS — M519 Unspecified thoracic, thoracolumbar and lumbosacral intervertebral disc disorder: Secondary | ICD-10-CM | POA: Insufficient documentation

## 2020-09-13 DIAGNOSIS — M25551 Pain in right hip: Secondary | ICD-10-CM | POA: Insufficient documentation

## 2020-09-13 NOTE — Progress Notes (Signed)
Subjective:    Patient ID: Matthew Sloan, male    DOB: 11/13/1960, 60 y.o.   MRN: [id number]  HPI: Matthew Sloan is a 60 y.o. male who returns for follow up appointment for chronic pain and medication refill. He Sloan his pain is located in his  Mid- lower back radiating into her right hip pain and right lower extremity. He rates his pain 10. His current exercise regime is walking and performing stretching exercises with bands. .  Matthew Sloan is 75.00 MME Last prescription was filled on 07/19/2020. Matthew Sloan for three weeks he had nausea, vomiting and diarrhea, he was seen by his PCP he reports. .  Oral Swab was Performed today.   Pain Inventory Average Pain 10 Pain Right Now 10 My pain is sharp, burning, stabbing, tingling and aching  In the last 24 hours, has pain interfered with the following? General activity 10 Relation with others 8 Enjoyment of life 8 What TIME of day is your pain at its worst? morning , daytime, evening and night Sleep (in general) Poor  Pain is worse with: walking, bending, sitting, standing and some activites Pain improves with: medication Relief from Meds: 2  Family History  Problem Relation Age of Onset  . Cancer Mother 49       breast cancer  . Heart disease Father 31       CHF   Social History   Socioeconomic History  . Marital status: Single    Spouse name: Not on file  . Number of children: Not on file  . Years of education: Not on file  . Highest education level: Not on file  Occupational History  . Not on file  Tobacco Use  . Smoking status: Current Every Day Smoker    Packs/day: 1.00    Years: 30.00    Pack years: 30.00    Types: Cigarettes  . Smokeless tobacco: Never Used  . Tobacco comment: working on it-down to 0.5 ppd  Vaping Use  . Vaping Use: Never used  Substance and Sexual Activity  . Alcohol use: No  . Drug use: No  . Sexual activity: Not on file  Other Topics Concern  . Not on file   Social History Narrative  . Not on file   Social Determinants of Health   Financial Resource Strain: Not on file  Food Insecurity: Not on file  Transportation Needs: Not on file  Physical Activity: Not on file  Stress: Not on file  Social Connections: Not on file   Past Surgical History:  Procedure Laterality Date  . bilateral tubes placed in ears  04/12/2012  . CARPAL TUNNEL RELEASE  12/15/2011   Procedure: CARPAL TUNNEL RELEASE;  Surgeon: [name], MD;  Location: [facility];  Service: Orthopedics;  Laterality: Left;  [name] MULTIPLE TOOTH EXTRACTIONS    . SHOULDER SURGERY  2012   lt  . ULNAR TUNNEL RELEASE  12/15/2011   Procedure: CUBITAL TUNNEL RELEASE;  Surgeon: [name], MD;  Location: [facility];  Service: Orthopedics;  Laterality: Left;  left cubital tunnel release   Past Surgical History:  Procedure Laterality Date  . bilateral tubes placed in ears  04/12/2012  . CARPAL TUNNEL RELEASE  12/15/2011   Procedure: CARPAL TUNNEL RELEASE;  Surgeon: [name], MD;  Location: [facility];  Service: Orthopedics;  Laterality: Left;  [name] MULTIPLE TOOTH EXTRACTIONS    . SHOULDER SURGERY  2012  lt  . ULNAR TUNNEL RELEASE  12/15/2011   Procedure: CUBITAL TUNNEL RELEASE;  Surgeon: [name], MD;  Location: [facility];  Service: Orthopedics;  Laterality: Left;  left cubital tunnel release   Past Medical History:  Diagnosis Date  . Allergy   . Chronic pain due to trauma   . Disturbance of skin sensation   . Intercostal neuralgia   . Lumbosacral neuritis   . Myocardial infarction ([facility])   . Rotator cuff (capsule) sprain   . Snores    BP 136/86   Pulse 78   Temp 98.5 F (36.9 C)   Ht 5\' 9"  (1.753 m)   Wt 236 lb 3.2 oz (107.1 kg)   SpO2 91%   BMI 34.88 kg/m   Opioid Risk Score:   Fall Risk Score:  `1  Depression screen PHQ 2/9  Depression screen [facility] 2/9 08/16/2020 05/17/2020 02/12/2020 01/12/2020 12/15/2019 12/16/2018  11/10/2018  Decreased Interest 0 0 0 0 0 0 0  Down, Depressed, Hopeless 0 0 - 0 0 0 0  PHQ - 2 Score 0 0 0 0 0 0 0  Altered sleeping - - - - - - -  Tired, decreased energy - - - - - - -  Feeling bad or failure about yourself  - - - - - - -  Trouble concentrating - - - - - - -  Moving slowly or fidgety/restless - - - - - - -  Suicidal thoughts - - - - - - -  PHQ-9 Score - - - - - - -   Review of Systems  Musculoskeletal: Positive for back pain and myalgias.  Neurological: Positive for tremors, weakness and numbness.  Hematological: Bruises/bleeds easily.  All other systems reviewed and are negative.      Objective:   Physical Exam Vitals and nursing note reviewed.  Constitutional:      Appearance: Normal appearance.  Cardiovascular:     Rate and Rhythm: Normal rate and regular rhythm.     Pulses: Normal pulses.     Heart sounds: Normal heart sounds.  Pulmonary:     Effort: Pulmonary effort is normal.     Breath sounds: Normal breath sounds.  Musculoskeletal:     Cervical back: Normal range of motion and neck supple.     Comments: Normal Muscle Bulk and Muscle Testing Reveals:  Upper Extremities: Right : Full ROM and Muscle Strength 5/5 Left: Upper Extremity: Decreased ROM 45 Degrees and Muscle Strength 4/5 Thoracic Paraspinal Tenderness: T-7-T-9 Lumbar Hypersensitivity Right: Greater Trochanter Tenderness Lower Extremities: Right: Decreased ROM and Muscle Strength 5/5 Right Lower Extremity Flexion produces Pain into his Lower Extremity and Lumbar Left Lower Extremity: Decreased ROM and Muscle Strength 5/5 Arises from Table slowly using cane for support Antalgic Gait   Skin:    General: Skin is warm and dry.  Neurological:     Mental Status: He is alert and oriented to person, place, and time.  Psychiatric:        Mood and Affect: Mood normal.        Behavior: Behavior normal.           Assessment & Plan:  1. Chronic pain due to trauma with chronic back pain  due to transverse and spinous process fractures: Continue Exercise regimen.Continue current medication regimen.09/13/2020 Refilled:Oxycodone 10 mg one tablet5 times a dayas needed for pain#150.  We will continue the opioid monitoring program, this consists of regular clinic visits, examinations, urine drug screen,  pill counts as well as use of [place] Controlled Substance Reporting system. A 12 month History has been reviewed on the [place] Controlled Substance Reporting System02/10/2020. 2. Lumbar Radiculitis: Continuecurrent medication regimen withGabapentin.09/13/2020 3. Intercostal neuralgia: Unable to continue Flector Patch due to cost, Continuecurrent medication regimen withVoltaren Gel,Robaxin and Neurontin.09/13/2020. 4. Insomnia: Continuecurrent medication regime withtrazodone.09/13/2020 5. Muscle Spasm: Continuecurrent medication regimen withRobaxin.09/13/2020 6. Polyarthralgia: Continue current medication regime. Continue to Monitor.09/13/2020 7. Chronic Bilateral Thoracic Pain: Continue HEP and Continue Current medication regimen. Continue to monitor.09/13/2020 8. Chronic Right Hip Pain/ Avascular Necrosis:Matthew Sloan Sloan he's awaiting a surgery date withDr. Dion Saucier.S/P Hip Injection by Dr. Sherlyn Lees relief noted.09/13/2020 9. Cervical Lymphadenopathy: Endocrinologist Following. S/P Radioactive Iodine Thyroid Ablation on 05/13/2020,Continue to monitor.09/13/2020   F/U in15month

## 2020-09-18 LAB — DRUG TOX MONITOR 1 W/CONF, ORAL FLD
Amphetamines: NEGATIVE ng/mL (ref <10)
Barbiturates: NEGATIVE ng/mL (ref <10)
Benzodiazepines: NEGATIVE ng/mL (ref <0.50)
Buprenorphine: NEGATIVE ng/mL (ref <0.10)
Cocaine: NEGATIVE ng/mL (ref <5.0)
Codeine: NEGATIVE ng/mL (ref <2.5)
Cotinine: >250 ng/mL — ABNORMAL HIGH (ref <5.0)
Dihydrocodeine: NEGATIVE ng/mL (ref <2.5)
Fentanyl: NEGATIVE ng/mL (ref <0.10)
Heroin Metabolite: NEGATIVE ng/mL (ref <1.0)
Hydrocodone: NEGATIVE ng/mL (ref <2.5)
Hydromorphone: NEGATIVE ng/mL (ref <2.5)
MARIJUANA: NEGATIVE ng/mL (ref <2.5)
MDMA: NEGATIVE ng/mL (ref <10)
Meprobamate: NEGATIVE ng/mL (ref <2.5)
Methadone: NEGATIVE ng/mL (ref <5.0)
Morphine: NEGATIVE ng/mL (ref <2.5)
Nicotine Metabolite: POSITIVE ng/mL — AB (ref <5.0)
Norhydrocodone: NEGATIVE ng/mL (ref <2.5)
Noroxycodone: 27.3 ng/mL — ABNORMAL HIGH (ref <2.5)
Opiates: POSITIVE ng/mL — AB (ref <2.5)
Oxycodone: >250 ng/mL — ABNORMAL HIGH (ref <2.5)
Oxymorphone: 6 ng/mL — ABNORMAL HIGH (ref <2.5)
Phencyclidine: NEGATIVE ng/mL (ref <10)
Tapentadol: NEGATIVE ng/mL (ref <5.0)
Tramadol: NEGATIVE ng/mL (ref <5.0)
Zolpidem: NEGATIVE ng/mL (ref <5.0)

## 2020-09-18 LAB — DRUG TOX ALC METAB W/CON, ORAL FLD: Alcohol Metabolite: NEGATIVE ng/mL (ref <25)

## 2020-09-19 ENCOUNTER — Telehealth: Payer: Self-pay | Admitting: *Deleted

## 2020-09-19 NOTE — Telephone Encounter (Signed)
Oral swab drug screen was consistent for prescribed medications.  ?

## 2020-10-03 ENCOUNTER — Ambulatory Visit: Payer: Medicare HMO | Admitting: "Endocrinology

## 2020-10-04 LAB — T4, FREE: Free T4: 2.14 ng/dL — ABNORMAL HIGH (ref 0.82–1.77)

## 2020-10-04 LAB — TSH: TSH: <0.005 u[IU]/mL — ABNORMAL LOW (ref 0.450–4.500)

## 2020-10-07 ENCOUNTER — Telehealth: Payer: Self-pay | Admitting: *Deleted

## 2020-10-07 ENCOUNTER — Ambulatory Visit (INDEPENDENT_AMBULATORY_CARE_PROVIDER_SITE_OTHER): Payer: Medicare HMO | Admitting: "Endocrinology

## 2020-10-07 ENCOUNTER — Other Ambulatory Visit: Payer: Self-pay

## 2020-10-07 ENCOUNTER — Encounter: Payer: Self-pay | Admitting: "Endocrinology

## 2020-10-07 VITALS — BP 144/86 | HR 76 | Ht 69.0 in | Wt 241.0 lb

## 2020-10-07 DIAGNOSIS — E890 Postprocedural hypothyroidism: Secondary | ICD-10-CM

## 2020-10-07 MED ORDER — LEVOTHYROXINE SODIUM 75 MCG PO TABS
75.0000 ug | ORAL_TABLET | Freq: Every day | ORAL | 0 refills | Status: DC
Start: 2020-10-07 — End: 2022-02-10

## 2020-10-07 NOTE — Progress Notes (Signed)
10/07/2020          Endocrinology follow-up note   Subjective:    Patient ID: Matthew Sloan, male    DOB: [birth year], PCP Renee Ramus, MD.   Past Medical History:  Diagnosis Date  . Allergy   . Chronic pain due to trauma   . Disturbance of skin sensation   . Intercostal neuralgia   . Lumbosacral neuritis   . Myocardial infarction ([place])   . Rotator cuff (capsule) sprain   . Snores     Past Surgical History:  Procedure Laterality Date  . bilateral tubes placed in ears  04/12/2012  . CARPAL TUNNEL RELEASE  12/15/2011   Procedure: CARPAL TUNNEL RELEASE;  Surgeon: Tennis Must, MD;  Location: [place];  Service: Orthopedics;  Laterality: Left;  [name] MULTIPLE TOOTH EXTRACTIONS    . SHOULDER SURGERY  2012   lt  . ULNAR TUNNEL RELEASE  12/15/2011   Procedure: CUBITAL TUNNEL RELEASE;  Surgeon: Tennis Must, MD;  Location: [place];  Service: Orthopedics;  Laterality: Left;  left cubital tunnel release    Social History   Socioeconomic History  . Marital status: Single    Spouse name: Not on file  . Number of children: Not on file  . Years of education: Not on file  . Highest education level: Not on file  Occupational History  . Not on file  Tobacco Use  . Smoking status: Current Every Day Smoker    Packs/day: 1.00    Years: 30.00    Pack years: 30.00    Types: Cigarettes  . Smokeless tobacco: Never Used  . Tobacco comment: working on it-down to 0.5 ppd  Vaping Use  . Vaping Use: Never used  Substance and Sexual Activity  . Alcohol use: No  . Drug use: No  . Sexual activity: Not on file  Other Topics Concern  . Not on file  Social History Narrative  . Not on file   Social Determinants of Health   Financial Resource Strain: Not on file  Food Insecurity: Not on file  Transportation Needs: Not on file  Physical Activity: Not on file  Stress: Not on file  Social Connections: Not on file    Family History  Problem  Relation Age of Onset  . Cancer Mother 45       breast cancer  . Heart disease Father 48       CHF    Outpatient Encounter Medications as of 10/07/2020  Medication Sig  . albuterol (VENTOLIN HFA) 108 (90 Base) MCG/ACT inhaler Inhale 2 puffs into the lungs as needed.  [name] apixaban (ELIQUIS) 5 MG TABS tablet Take 5 mg by mouth 2 (two) times daily.  [name] BREO ELLIPTA 200-25 MCG/INH AEPB Inhale 1 puff into the lungs daily.  [name] gabapentin (NEURONTIN) 800 MG tablet Take 1 tablet (800 mg total) by mouth 3 (three) times daily.  [name] levothyroxine (SYNTHROID) 75 MCG tablet Take 1 tablet (75 mcg total) by mouth daily before breakfast.  . methocarbamol (ROBAXIN) 500 MG tablet Take 1 tablet (500 mg total) by mouth 3 (three) times daily.  . metoprolol tartrate (LOPRESSOR) 25 MG tablet Take 25 mg by mouth 2 (two) times daily.  . Oxycodone HCl 10 MG TABS Take 1 tablet (10 mg total) by mouth 5 (five) times daily as needed.  . pravastatin (PRAVACHOL) 20 MG tablet Take 20 mg by mouth at bedtime.  . pravastatin (PRAVACHOL) 40 MG tablet Take  1 tablet by mouth at bedtime.  . promethazine (PHENERGAN) 12.5 MG tablet Take 12.5 mg by mouth 2 (two) times daily as needed.  . traZODone (DESYREL) 100 MG tablet TAKE 1 TABLET (100 MG TOTAL) BY MOUTH AT BEDTIME. (Patient taking differently: TAKE 1 TABLET (100 MG TOTAL) BY MOUTH AT BEDTIME. AS NEEDED)  . [DISCONTINUED] levothyroxine (SYNTHROID) 100 MCG tablet Take 1 tablet (100 mcg total) by mouth daily before breakfast.   No facility-administered encounter medications on file as of 10/07/2020.    ALLERGIES: Allergies  Allergen Reactions  . Penicillins     VACCINATION STATUS:  There is no immunization history on file for this patient.   HPI  Matthew Sloan is 60 y.o. male who is presenting to follow-up after he was given radioactive iodine for treatment of hyperthyroidism from Graves' disease.    Referral requested by  Renee Ramus, MD. -He was started on  levothyroxine 100 mcg p.o. daily during his last visit.  His previsit labs are consistent with slight over replacement.  He has symptoms including heat intolerance, sleep disturbance.  He denies dysphagia, shortness of breath.   He is known to have goiter with no discrete nodules. -He denies recent voice change.  He is a chronic active smoker.   he denies family history of thyroid dysfunction.  He denies  family hx of thyroid cancer.                            Review of systems As above.   Objective:    BP (!) 144/86   Pulse 76   Ht _0  (1.753 m)   Wt 241 lb (109.3 kg)   BMI 35.59 kg/m   Wt Readings from Last 3 Encounters:  10/07/20 241 lb (109.3 kg)  09/13/20 236 lb 3.2 oz (107.1 kg)  08/16/20 227 lb (103 kg)        Neck exam: Thyroid generally smaller than last exam.  No gross palpable goiter.                  CMP     Component Value Date/Time   NA 139 01/03/2010 0545   K 4.3 01/03/2010 0545   CL 103 01/03/2010 0545   CO2 26 01/03/2010 0545   GLUCOSE 104 (H) 01/03/2010 0545   BUN 14 01/03/2010 0545   CREATININE 0.89 01/03/2010 0545   CALCIUM 8.7 01/03/2010 0545   PROT 6.7 10/10/2012 1031   ALBUMIN 4.3 10/10/2012 1031   AST 18 10/10/2012 1031   ALT 30 10/10/2012 1031   ALKPHOS 50 10/10/2012 1031   BILITOT 0.4 10/10/2012 1031   GFRNONAA >60 01/03/2010 0545   GFRAA  01/03/2010 0545    >60        The eGFR has been calculated using the MDRD equation. This calculation has not been validated in all clinical situations. eGFR's persistently <60 mL/min signify possible Chronic Kidney Disease.     CBC    Component Value Date/Time   WBC 8.6 01/07/2010 0945   RBC 3.81 (L) 01/07/2010 0945   HGB 15.7 12/15/2011 0809   HCT 33.9 (L) 01/07/2010 0945   PLT 514 (H) 01/07/2010 0945   MCV 89.1 01/07/2010 0945   MCH 29.6 01/07/2010 0945   MCHC 33.3 01/07/2010 0945   RDW 13.6 01/07/2010 0945   LYMPHSABS 2.3 01/07/2010 0945   MONOABS 0.6 01/07/2010 0945   EOSABS 0.7  01/07/2010 0945   BASOSABS 0.1  01/07/2010 0945   Recent Results (from the past 2160 hour(s))  TSH     Status: Abnormal   Collection Time: 08/07/20  3:55 PM  Result Value Ref Range   TSH 14.900 (H) 0.450 - 4.500 uIU/mL  T4, free     Status: Abnormal   Collection Time: 08/07/20  3:55 PM  Result Value Ref Range   Free T4 0.26 (L) 0.82 - 1.77 ng/dL  T3, free     Status: Abnormal   Collection Time: 08/07/20  3:55 PM  Result Value Ref Range   T3, Free 1.7 (L) 2.0 - 4.4 pg/mL  Thyroid peroxidase antibody     Status: Abnormal   Collection Time: 08/07/20  3:55 PM  Result Value Ref Range   Thyroperoxidase Ab SerPl-aCnc >600 (H) 0 - 34 IU/mL  Thyroglobulin antibody     Status: Abnormal   Collection Time: 08/07/20  3:55 PM  Result Value Ref Range   Thyroglobulin Antibody >2250.0 (A) 0.0 - 0.9 IU/mL    Comment: Thyroglobulin Antibody measured by [employer] Coulter Methodology  Drug Tox Alc Metab w/Con, Oral Fld     Status: None   Collection Time: 09/13/20 10:02 AM  Result Value Ref Range   Alcohol Metabolite NEGATIVE <25 ng/mL    Comment: . For additional information, please refer to http://education.questdiagnostics.com/faq/FAQ183 (This link is being provided for informational/ educational purposes only.) . This drug testing is for medical treatment only. Analysis was performed as non-forensic testing and these results should be used only by healthcare providers to render diagnosis or treatment, or to monitor progress of medical conditions. . For assistance with interpreting these drug results, please contact a [employer] Toxicology Specialist: [phone] [place] [phone]), M-F, 8am-6pm EST. [name] These tests were developed and their analytical performance characteristics have been determined by [facility]. They have not been cleared or approved by the FDA. These assays have been validated pursuant to the CLIA regulations and are used for clinical purposes.   Drug  Tox Monitor 1 w/Conf, Oral Fld     Status: Abnormal   Collection Time: 09/13/20 10:02 AM  Result Value Ref Range   Amphetamines NEGATIVE <10 ng/mL   Barbiturates NEGATIVE <10 ng/mL   Benzodiazepines NEGATIVE <0.50 ng/mL   Buprenorphine NEGATIVE <0.10 ng/mL   Cocaine NEGATIVE <5.0 ng/mL   Fentanyl NEGATIVE <0.10 ng/mL   Heroin Metabolite NEGATIVE <1.0 ng/mL   MARIJUANA NEGATIVE <2.5 ng/mL   MDMA NEGATIVE <10 ng/mL   Meprobamate NEGATIVE <2.5 ng/mL   Methadone NEGATIVE <5.0 ng/mL   Nicotine Metabolite POSITIVE (A) <5.0 ng/mL   Cotinine >250.0 (H) <5.0 ng/mL    Comment: Cotinine is a metabolite of nicotine.   Opiates POSITIVE (A) <2.5 ng/mL   Codeine Negative <2.5 ng/mL   Dihydrocodeine Negative <2.5 ng/mL   Hydrocodone Negative <2.5 ng/mL   Hydromorphone Negative <2.5 ng/mL   Morphine Negative <2.5 ng/mL   Norhydrocodone Negative <2.5 ng/mL   Noroxycodone 27.3 (H) <2.5 ng/mL    Comment: . Noroxycodone is a metabolite of oxycodone.    Oxycodone >250.0 (H) <2.5 ng/mL   Oxymorphone 6.0 (H) <2.5 ng/mL    Comment: . Oxymorphone is a metabolite of oxycodone as well as a prescribed drug.    Phencyclidine NEGATIVE <10 ng/mL   Tapentadol NEGATIVE <5.0 ng/mL   Tramadol NEGATIVE <5.0 ng/mL   Zolpidem NEGATIVE <5.0 ng/mL    Comment: . For additional information, please refer to http://education.[facility].com/faq/FAQ186 (This link is being provided for informational/ educational purposes only.) . This drug  testing is for medical treatment only. Analysis was performed as non-forensic testing and these results should be used only by healthcare providers to render diagnosis or treatment, or to monitor progress of medical conditions. . For assistance with interpreting these drug results, please contact a [employer] Toxicology Specialist: [phone] [place] [phone]), M-F, 8am-6pm EST. [name] These tests were developed and their analytical performance characteristics  have been determined by [facility]. They have not been cleared or approved by the FDA. These assays have been validated pursuant to the CLIA regulations and are used for clinical purposes.   TSH     Status: Abnormal   Collection Time: 10/03/20  3:17 PM  Result Value Ref Range   TSH <0.005 (L) 0.450 - 4.500 uIU/mL  T4, free     Status: Abnormal   Collection Time: 10/03/20  3:17 PM  Result Value Ref Range   Free T4 2.14 (H) 0.82 - 1.77 ng/dL    April 24, 2020 thyroid uptake and scan: 24-hour uptake 56.9%.  Normal range 15 to 35%.  Previously, the 24-hour uptake was similarly elevated to 57.3%. Impression: Homogeneously increased uptake within an enlarged thyroid gland, is suggestive of Graves' disease.     Assessment & Plan:   1.  RAI induced hypothyroidism  2.  Graves' disease-resolved See notes from prior visits.  He is status post RAI thyroid ablation for clinically significant hyperthyroidism from Graves' disease.  His previsit labs are consistent with slight over replacement.   I discussed and lowered his levothyroxine to 75 mcg p.o. daily before breakfast.   - We discussed about the correct intake of his thyroid hormone, on empty stomach at fasting, with water, separated by at least 30 minutes from breakfast and other medications,  and separated by more than 4 hours from calcium, iron, multivitamins, acid reflux medications (PPIs). -Patient is made aware of the fact that thyroid hormone replacement is needed for life, dose to be adjusted by periodic monitoring of thyroid function tests.   There is apparent reduction in size of his thyroid as a result of radioactive iodine treatment.   -He is already on metoprolol 25 mg p.o. twice daily, advised to be consistent taking his medication, did not initiate any additional beta-blockers.   - I advised him to maintain close follow up with Renee Ramus, MD for primary care needs.     - Time spent on this patient care  encounter:  30 minutes of which 50% was spent in  counseling and the rest reviewing  his current and  previous labs / studies and medications  doses and developing a plan for long term care, and documenting this care. Matthew Sloan  participated in the discussions, expressed understanding, and voiced agreement with the above plans.  All questions were answered to his satisfaction. he is encouraged to contact clinic should he have any questions or concerns prior to his return visit.   Follow up plan: Return in about 3 months (around 01/07/2021) for F/U with Pre-visit Labs.   Thank you for involving me in the care of this pleasant patient, and I will continue to update you with his progress.  Glade Lloyd, MD [facility] Phone: [phone]  Fax: [phone]   10/07/2020, 2:16 PM  This note was partially dictated with voice recognition software. Similar sounding words can be transcribed inadequately or may not  be corrected upon review.

## 2020-10-07 NOTE — Telephone Encounter (Signed)
Received a post surgery pain mgmt agreement fom [facility].  Jacalyn Lefevre, ANP reviewed and signed.  Faxed back to [employer]

## 2020-10-24 ENCOUNTER — Encounter: Payer: Medicare HMO | Admitting: Registered Nurse

## 2020-10-28 ENCOUNTER — Other Ambulatory Visit: Payer: Self-pay

## 2020-10-28 ENCOUNTER — Encounter: Payer: Medicare HMO | Attending: Physical Medicine & Rehabilitation | Admitting: Registered Nurse

## 2020-10-28 ENCOUNTER — Encounter: Payer: Self-pay | Admitting: Registered Nurse

## 2020-10-28 VITALS — Ht 69.0 in | Wt 241.0 lb

## 2020-10-28 DIAGNOSIS — G8929 Other chronic pain: Secondary | ICD-10-CM

## 2020-10-28 DIAGNOSIS — M519 Unspecified thoracic, thoracolumbar and lumbosacral intervertebral disc disorder: Secondary | ICD-10-CM | POA: Diagnosis not present

## 2020-10-28 DIAGNOSIS — Z79891 Long term (current) use of opiate analgesic: Secondary | ICD-10-CM

## 2020-10-28 DIAGNOSIS — M546 Pain in thoracic spine: Secondary | ICD-10-CM

## 2020-10-28 DIAGNOSIS — W1830XA Fall on same level, unspecified, initial encounter: Secondary | ICD-10-CM

## 2020-10-28 DIAGNOSIS — G894 Chronic pain syndrome: Secondary | ICD-10-CM

## 2020-10-28 DIAGNOSIS — M25551 Pain in right hip: Secondary | ICD-10-CM | POA: Diagnosis not present

## 2020-10-28 DIAGNOSIS — M5416 Radiculopathy, lumbar region: Secondary | ICD-10-CM | POA: Diagnosis not present

## 2020-10-28 DIAGNOSIS — M8700 Idiopathic aseptic necrosis of unspecified bone: Secondary | ICD-10-CM

## 2020-10-28 DIAGNOSIS — W19XXXD Unspecified fall, subsequent encounter: Secondary | ICD-10-CM

## 2020-10-28 DIAGNOSIS — Z5181 Encounter for therapeutic drug level monitoring: Secondary | ICD-10-CM

## 2020-10-28 DIAGNOSIS — M2550 Pain in unspecified joint: Secondary | ICD-10-CM

## 2020-10-28 MED ORDER — OXYCODONE HCL 10 MG PO TABS: 10.0000 mg | ORAL_TABLET | Freq: Every day | ORAL | 0 refills | Status: DC | PRN

## 2020-10-28 NOTE — Progress Notes (Signed)
Subjective:    Patient ID: Matthew Sloan, male    DOB: 1961-03-07, 60 y.o.   MRN: [id number]  HPI: Matthew Sloan is a 60 y.o. male whose appointment was changed to a telephone call. Matthew Sloan called office reporting increase intensity of pain, since a fall on Sunday. He reports he was coming out of church on Sunday and his right hip gave way and he fell on his right side. Parishioners helped him up, he didn't seek medical attention. He states his pain is located in his lower back radiating into his right hip and right lower extremity. He  rates his pain 9.current exercise regime is walking.   Matthew Sloan agrees with telephone visit and verbalizes understanding. Matthew Sloan CMA asked The Health and History Questions. This provider and Matthew Sloan verified we were speaking with the correct person using two identifiers.   [name] Morphine equivalent is 75.00 MME.  Last Oral Swab was Performed on 09/13/2020, it was consistent.    Pain Inventory Average Pain 8 Pain Right Now 9 My pain is constant, sharp, burning, dull, stabbing, tingling and aching  In the last 24 hours, has pain interfered with the following? General activity 10 Relation with others 0 Enjoyment of life 5 What TIME of day is your pain at its worst? morning , daytime, evening and night Sleep (in general) Poor  Pain is worse with: walking, bending, sitting, inactivity, standing and some activites Pain improves with: medication Relief from Meds: 2  Family History  Problem Relation Age of Onset  . Cancer Mother 47       breast cancer  . Heart disease Father 71       CHF   Social History   Socioeconomic History  . Marital status: Single    Spouse name: Not on file  . Number of children: Not on file  . Years of education: Not on file  . Highest education level: Not on file  Occupational History  . Not on file  Tobacco Use  . Smoking status: Current Every Day Smoker    Packs/day: 1.00    Years:  30.00    Pack years: 30.00    Types: Cigarettes  . Smokeless tobacco: Never Used  . Tobacco comment: working on it-down to 0.5 ppd  Vaping Use  . Vaping Use: Never used  Substance and Sexual Activity  . Alcohol use: No  . Drug use: No  . Sexual activity: Not on file  Other Topics Concern  . Not on file  Social History Narrative  . Not on file   Social Determinants of Health   Financial Resource Strain: Not on file  Food Insecurity: Not on file  Transportation Needs: Not on file  Physical Activity: Not on file  Stress: Not on file  Social Connections: Not on file   Past Surgical History:  Procedure Laterality Date  . bilateral tubes placed in ears  04/12/2012  . CARPAL TUNNEL RELEASE  12/15/2011   Procedure: CARPAL TUNNEL RELEASE;  Surgeon: Matthew Ribas, MD;  Location: [facility];  Service: Orthopedics;  Laterality: Left;  Matthew Sloan MULTIPLE TOOTH EXTRACTIONS    . SHOULDER SURGERY  2012   lt  . ULNAR TUNNEL RELEASE  12/15/2011   Procedure: CUBITAL TUNNEL RELEASE;  Surgeon: Matthew Ribas, MD;  Location: [facility];  Service: Orthopedics;  Laterality: Left;  left cubital tunnel release   Past Surgical History:  Procedure Laterality Date  . bilateral  tubes placed in ears  04/12/2012  . CARPAL TUNNEL RELEASE  12/15/2011   Procedure: CARPAL TUNNEL RELEASE;  Surgeon: Matthew Ribas, MD;  Location: [facility];  Service: Orthopedics;  Laterality: Left;  Matthew Sloan MULTIPLE TOOTH EXTRACTIONS    . SHOULDER SURGERY  2012   lt  . ULNAR TUNNEL RELEASE  12/15/2011   Procedure: CUBITAL TUNNEL RELEASE;  Surgeon: Matthew Ribas, MD;  Location: [facility];  Service: Orthopedics;  Laterality: Left;  left cubital tunnel release   Past Medical History:  Diagnosis Date  . Allergy   . Chronic pain due to trauma   . Disturbance of skin sensation   . Intercostal neuralgia   . Lumbosacral neuritis   . Myocardial infarction ([facility])   . Rotator cuff (capsule)  sprain   . Snores    Ht 5\' 9"  (1.753 m)   Wt 241 lb (109.3 kg)   BMI 35.59 kg/m   Opioid Risk Score:   Fall Risk Score:  `1  Depression screen PHQ 2/9  Depression screen [facility] 2/9 08/16/2020 05/17/2020 02/12/2020 01/12/2020 12/15/2019 12/16/2018 11/10/2018  Decreased Interest 0 0 0 0 0 0 0  Down, Depressed, Hopeless 0 0 - 0 0 0 0  PHQ - 2 Score 0 0 0 0 0 0 0  Altered sleeping - - - - - - -  Tired, decreased energy - - - - - - -  Feeling bad or failure about yourself  - - - - - - -  Trouble concentrating - - - - - - -  Moving slowly or fidgety/restless - - - - - - -  Suicidal thoughts - - - - - - -  PHQ-9 Score - - - - - - -    Review of Systems  Constitutional: Negative.   HENT: Negative.   Eyes: Negative.   Respiratory: Negative.   Cardiovascular: Negative.   Gastrointestinal: Negative.   Endocrine: Negative.   Genitourinary: Negative.   Musculoskeletal: Positive for arthralgias, back pain, gait problem, myalgias, neck pain and neck stiffness.  Skin: Negative.   Allergic/Immunologic: Negative.   Neurological: Positive for weakness and numbness.       Tingling  Hematological: Negative.   Psychiatric/Behavioral: Negative.   All other systems reviewed and are negative.      Objective:   Physical Exam Vitals and nursing note reviewed.  Musculoskeletal:     Comments: No Physical Exam : Telephone Visit           Assessment & Plan:  1. 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  We will continue the opioid monitoring program, this consists of regular clinic visits, examinations, urine drug screen, pill counts as well as use of 10/30/2020 Controlled Substance Reporting system. A 12 month History has been reviewed on the [place] Controlled Substance Reporting  System04/18/2022. 2. Lumbar Radiculitis: Continuecurrent medication regimen withGabapentin.10/28/2020 3. Intercostal neuralgia: Unable to continue Flector Patch due to cost, Continuecurrent medication regimen withVoltaren Gel,Robaxin and Neurontin.10/28/2020. 4. Insomnia: Continuecurrent medication regime withtrazodone.10/28/2020 5. Muscle Spasm: Continuecurrent medication regimen withRobaxin.10/28/2020 6. Polyarthralgia: Continue current medication regime. Continue to Monitor.10/28/2020 7. Chronic Bilateral Thoracic Pain: Continue HEP and Continue Current medication regimen. Continue to monitor.10/28/2020 8. Chronic Right Hip Pain/ Avascular Necrosis:Mr. Navis states he's awaiting a surgery date withDr. 10/30/2020.S/P Hip Injection by Dr. Dion Saucier  relief noted.10/28/2020 9. Cervical Lymphadenopathy: Endocrinologist Following. S/P Radioactive Iodine Thyroid Ablation on 05/13/2020,Continue to monitor.10/28/2020 10. Fall from [employer] Level: Educated on [employer], he verbalizes understanding.   F/U in62month Telephone Visit Established Patient Location of Patient: In his Home Location of Provider: In the Office Total Time Spent: 10 Minutes

## 2020-11-29 ENCOUNTER — Encounter: Payer: Medicare HMO | Attending: Physical Medicine & Rehabilitation | Admitting: Registered Nurse

## 2020-11-29 ENCOUNTER — Other Ambulatory Visit: Payer: Self-pay

## 2020-11-29 VITALS — Ht 69.0 in | Wt 241.0 lb

## 2020-11-29 DIAGNOSIS — M5416 Radiculopathy, lumbar region: Secondary | ICD-10-CM

## 2020-11-29 DIAGNOSIS — M25551 Pain in right hip: Secondary | ICD-10-CM

## 2020-11-29 DIAGNOSIS — Z79891 Long term (current) use of opiate analgesic: Secondary | ICD-10-CM

## 2020-11-29 DIAGNOSIS — M2550 Pain in unspecified joint: Secondary | ICD-10-CM

## 2020-11-29 DIAGNOSIS — G8929 Other chronic pain: Secondary | ICD-10-CM

## 2020-11-29 DIAGNOSIS — M546 Pain in thoracic spine: Secondary | ICD-10-CM | POA: Diagnosis not present

## 2020-11-29 DIAGNOSIS — M8700 Idiopathic aseptic necrosis of unspecified bone: Secondary | ICD-10-CM

## 2020-11-29 DIAGNOSIS — Z5181 Encounter for therapeutic drug level monitoring: Secondary | ICD-10-CM

## 2020-11-29 DIAGNOSIS — M519 Unspecified thoracic, thoracolumbar and lumbosacral intervertebral disc disorder: Secondary | ICD-10-CM | POA: Diagnosis not present

## 2020-11-29 DIAGNOSIS — G894 Chronic pain syndrome: Secondary | ICD-10-CM

## 2020-11-29 MED ORDER — OXYCODONE HCL 10 MG PO TABS: 10.0000 mg | ORAL_TABLET | Freq: Every day | ORAL | 0 refills | Status: DC | PRN

## 2020-11-29 NOTE — Progress Notes (Signed)
Subjective:    Patient ID: Matthew Sloan, male    DOB: 11/09/1960, 60 y.o.   MRN: [id number]  HPI: Matthew Sloan is a 60 y.o. male whose appointment was changed to a telephone visit, Matthew Sloan called the office reporting he was unable to walk and he's still waiting for surgery for his right hip. He agrees with telephone visit and verbalizes understanding.  He states his pain is located in his mid- lower back pain radiatng into his right hip and generalized joint pain. He rates his pain 10. His current exercise regime is walking to his bathroom with his cane.   Matthew Sloan equivalent is 70.00 MME.  Oral Swab was Performed on 09/13/2020, it was consistent.      Pain Inventory Average Pain 10 Pain Right Now 10 My pain is constant, sharp and aching  In the last 24 hours, has pain interfered with the following? General activity 10 Relation with others 10 Enjoyment of life 10 What TIME of day is your pain at its worst? morning , daytime, evening and night Sleep (in general) Fair  Pain is worse with: walking, bending, sitting, standing and some activites Pain improves with: rest, pacing activities and medication Relief from Meds: 4  Family History  Problem Relation Age of Onset  . Cancer Mother 74       breast cancer  . Heart disease Father 11       CHF   Social History   Socioeconomic History  . Marital status: Single    Spouse name: Not on file  . Number of children: Not on file  . Years of education: Not on file  . Highest education level: Not on file  Occupational History  . Not on file  Tobacco Use  . Smoking status: Current Every Day Smoker    Packs/day: 1.00    Years: 30.00    Pack years: 30.00    Types: Cigarettes  . Smokeless tobacco: Never Used  . Tobacco comment: working on it-down to 0.5 ppd  Vaping Use  . Vaping Use: Never used  Substance and Sexual Activity  . Alcohol use: No  . Drug use: No  . Sexual activity: Not on file  Other Topics  Concern  . Not on file  Social History Narrative  . Not on file   Social Determinants of Health   Financial Resource Strain: Not on file  Food Insecurity: Not on file  Transportation Needs: Not on file  Physical Activity: Not on file  Stress: Not on file  Social Connections: Not on file   Past Surgical History:  Procedure Laterality Date  . bilateral tubes placed in ears  04/12/2012  . CARPAL TUNNEL RELEASE  12/15/2011   Procedure: CARPAL TUNNEL RELEASE;  Surgeon: [name], MD;  Location: [facility];  Service: Orthopedics;  Laterality: Left;  [name] MULTIPLE TOOTH EXTRACTIONS    . SHOULDER SURGERY  2012   lt  . ULNAR TUNNEL RELEASE  12/15/2011   Procedure: CUBITAL TUNNEL RELEASE;  Surgeon: [name], MD;  Location: [facility];  Service: Orthopedics;  Laterality: Left;  left cubital tunnel release   Past Surgical History:  Procedure Laterality Date  . bilateral tubes placed in ears  04/12/2012  . CARPAL TUNNEL RELEASE  12/15/2011   Procedure: CARPAL TUNNEL RELEASE;  Surgeon: [name], MD;  Location: [facility];  Service: Orthopedics;  Laterality: Left;  [name] MULTIPLE TOOTH EXTRACTIONS    .  SHOULDER SURGERY  2012   lt  . ULNAR TUNNEL RELEASE  12/15/2011   Procedure: CUBITAL TUNNEL RELEASE;  Surgeon: [name], MD;  Location: [facility];  Service: Orthopedics;  Laterality: Left;  left cubital tunnel release   Past Medical History:  Diagnosis Date  . Allergy   . Chronic pain due to trauma   . Disturbance of skin sensation   . Intercostal neuralgia   . Lumbosacral neuritis   . Myocardial infarction (HCC)   . Rotator cuff (capsule) sprain   . Snores    Ht 5\' 9"  (1.753 m) Comment: pt reported, virtual visit  Wt 241 lb (109.3 kg) Comment: pt reported, virtual visit  BMI 35.59 kg/m   Opioid Risk Score:   Fall Risk Score:  `1  Depression screen PHQ 2/9  Depression screen [facility] 2/9 08/16/2020 05/17/2020 02/12/2020  01/12/2020 12/15/2019 12/16/2018 11/10/2018  Decreased Interest 0 0 0 0 0 0 0  Down, Depressed, Hopeless 0 0 - 0 0 0 0  PHQ - 2 Score 0 0 0 0 0 0 0  Altered sleeping - - - - - - -  Tired, decreased energy - - - - - - -  Feeling bad or failure about yourself  - - - - - - -  Trouble concentrating - - - - - - -  Moving slowly or fidgety/restless - - - - - - -  Suicidal thoughts - - - - - - -  PHQ-9 Score - - - - - - -    Review of Systems  Musculoskeletal: Positive for back pain.       Hip pain Shoulder pain, left  Neurological: Positive for weakness.  All other systems reviewed and are negative.      Objective:   Physical Exam Vitals and nursing note reviewed.  Musculoskeletal:     Comments: No Physical Exam: Telephone Visit           Assessment & Plan:  . 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  We will continue the opioid monitoring program, this consists of regular clinic visits, examinations, urine drug screen, pill counts as well as use of 12/01/2020 Controlled Substance Reporting system. A 12 month History has been reviewed on the [place] Controlled Substance Reporting System05/20/2022. 2. Lumbar Radiculitis: Continuecurrent medication regimen withGabapentin.11/29/2020 3. Intercostal neuralgia: Unable to continue Flector Patch due to cost, Continuecurrent medication regimen withVoltaren Gel,Robaxin and Neurontin.11/29/2020. 4. Insomnia: Continuecurrent medication regime withtrazodone.11/29/2020 5. Muscle Spasm: Continuecurrent medication regimen withRobaxin.11/29/2020 6. Polyarthralgia: Continue current medication regime. Continue to Monitor.11/29/2020 7. Chronic Bilateral Thoracic Pain: Continue HEP and Continue Current medication regimen.  Continue to monitor.11/29/2020 8. Chronic Right Hip Pain/ Avascular Necrosis:Matthew Sloan states he's awaiting a surgery date withDr. 12/01/2020.S/P Hip Injection by Dr. Dion Saucier relief noted.11/29/2020 9. Cervical Lymphadenopathy: Endocrinologist Following. S/P Radioactive Iodine Thyroid Ablation on 05/13/2020,Continue to monitor.11/29/2020  F/U in30month Telephone Visit Established Patient Location of Patient: In his Home Location of Provider: In the Office Total Time Spent: 10 Minutes

## 2020-12-04 ENCOUNTER — Encounter: Payer: Self-pay | Admitting: Registered Nurse

## 2020-12-26 ENCOUNTER — Telehealth: Payer: Self-pay | Admitting: Registered Nurse

## 2020-12-26 NOTE — Telephone Encounter (Signed)
Patient ask that you give him a call.

## 2020-12-27 ENCOUNTER — Encounter: Payer: Medicare HMO | Admitting: Registered Nurse

## 2020-12-27 NOTE — Telephone Encounter (Signed)
This provider spoke with Matthew Sloan this morning. Apologize for late return call, he verbalizes understanding. Matthew Sloan states he needs to change his appointment, he is trying to find someone to bring him to his appointment. He was instructed to call office on Monday 12/30/2020 with an update, he verbalizes understanding.

## 2021-01-03 ENCOUNTER — Encounter: Payer: Medicare HMO | Admitting: Registered Nurse

## 2021-01-06 ENCOUNTER — Encounter: Payer: Medicare HMO | Admitting: Registered Nurse

## 2021-01-07 ENCOUNTER — Ambulatory Visit: Payer: Medicare HMO | Admitting: "Endocrinology

## 2021-01-08 ENCOUNTER — Other Ambulatory Visit: Payer: Self-pay

## 2021-01-08 ENCOUNTER — Encounter: Payer: Self-pay | Admitting: Registered Nurse

## 2021-01-08 ENCOUNTER — Encounter: Payer: Medicare HMO | Attending: Physical Medicine & Rehabilitation | Admitting: Registered Nurse

## 2021-01-08 VITALS — BP 150/81 | HR 87 | Temp 98.0°F | Ht 69.0 in | Wt 238.0 lb

## 2021-01-08 DIAGNOSIS — M546 Pain in thoracic spine: Secondary | ICD-10-CM | POA: Diagnosis present

## 2021-01-08 DIAGNOSIS — M25512 Pain in left shoulder: Secondary | ICD-10-CM | POA: Diagnosis present

## 2021-01-08 DIAGNOSIS — M8700 Idiopathic aseptic necrosis of unspecified bone: Secondary | ICD-10-CM | POA: Diagnosis present

## 2021-01-08 DIAGNOSIS — M519 Unspecified thoracic, thoracolumbar and lumbosacral intervertebral disc disorder: Secondary | ICD-10-CM

## 2021-01-08 DIAGNOSIS — M5416 Radiculopathy, lumbar region: Secondary | ICD-10-CM | POA: Diagnosis not present

## 2021-01-08 DIAGNOSIS — M25551 Pain in right hip: Secondary | ICD-10-CM | POA: Insufficient documentation

## 2021-01-08 DIAGNOSIS — M2550 Pain in unspecified joint: Secondary | ICD-10-CM

## 2021-01-08 DIAGNOSIS — Z5181 Encounter for therapeutic drug level monitoring: Secondary | ICD-10-CM

## 2021-01-08 DIAGNOSIS — M25511 Pain in right shoulder: Secondary | ICD-10-CM | POA: Diagnosis not present

## 2021-01-08 DIAGNOSIS — Z79891 Long term (current) use of opiate analgesic: Secondary | ICD-10-CM

## 2021-01-08 DIAGNOSIS — G894 Chronic pain syndrome: Secondary | ICD-10-CM

## 2021-01-08 DIAGNOSIS — G8929 Other chronic pain: Secondary | ICD-10-CM | POA: Diagnosis present

## 2021-01-08 MED ORDER — OXYCODONE HCL 10 MG PO TABS: 10.0000 mg | ORAL_TABLET | Freq: Every day | ORAL | 0 refills | Status: DC | PRN

## 2021-01-08 NOTE — Progress Notes (Signed)
Subjective:    Patient ID: Matthew Sloan, male    DOB: 1961/06/17, 60 y.o.   MRN: [id number]  HPI: Matthew Sloan is a 60 y.o. male who returns for follow up appointment for chronic pain and medication refill. He states his pain is located in his bilateral shoulder L>R, mid- lower back pain radiating into his right hip and right lower extremity. Also reports he has generalized pain all over. He rates his pain 9. His current exercise regime is walking and performing stretching exercises with bands.  Matthew Sloan reports he is awaiting release for surgery from his Pulmonologist, we will continue to monitor. Matthew Sloan will call office when he gets a surgery date. He verbalizes understanding.    Matthew Sloan Morphine equivalent is 75.00 MME.   Last Oral Swab was Performed on 09/13/2020, it was consistent.   Pain Inventory Average Pain 10 Pain Right Now 9 My pain is constant, sharp, burning, stabbing, tingling, and aching  In the last 24 hours, has pain interfered with the following? General activity 10 Relation with others 8 Enjoyment of life 8 What TIME of day is your pain at its worst? morning , daytime, evening, and night Sleep (in general) Poor  Pain is worse with: walking, bending, sitting, inactivity, and standing Pain improves with: medication Relief from Meds: 2  Family History  Problem Relation Age of Onset   Cancer Mother 71       breast cancer   Heart disease Father 20       CHF   Social History   Socioeconomic History   Marital status: Single    Spouse name: Not on file   Number of children: Not on file   Years of education: Not on file   Highest education level: Not on file  Occupational History   Not on file  Tobacco Use   Smoking status: Every Day    Packs/day: 1.00    Years: 30.00    Pack years: 30.00    Types: Cigarettes   Smokeless tobacco: Never   Tobacco comments:    working on it-down to 0.5 ppd  Vaping Use   Vaping Use: Never used  Substance and  Sexual Activity   Alcohol use: No   Drug use: No   Sexual activity: Not on file  Other Topics Concern   Not on file  Social History Narrative   Not on file   Social Determinants of Health   Financial Resource Strain: Not on file  Food Insecurity: Not on file  Transportation Needs: Not on file  Physical Activity: Not on file  Stress: Not on file  Social Connections: Not on file   Past Surgical History:  Procedure Laterality Date   bilateral tubes placed in ears  04/12/2012   CARPAL TUNNEL RELEASE  12/15/2011   Procedure: CARPAL TUNNEL RELEASE;  Surgeon: [name], MD;  Location: [facility];  Service: Orthopedics;  Laterality: Left;   MULTIPLE TOOTH EXTRACTIONS     SHOULDER SURGERY  2012   lt   ULNAR TUNNEL RELEASE  12/15/2011   Procedure: CUBITAL TUNNEL RELEASE;  Surgeon: [name], MD;  Location: [facility];  Service: Orthopedics;  Laterality: Left;  left cubital tunnel release   Past Surgical History:  Procedure Laterality Date   bilateral tubes placed in ears  04/12/2012   CARPAL TUNNEL RELEASE  12/15/2011   Procedure: CARPAL TUNNEL RELEASE;  Surgeon: [name], MD;  Location: [facility];  Service: Orthopedics;  Laterality: Left;   MULTIPLE TOOTH EXTRACTIONS     SHOULDER SURGERY  2012   lt   ULNAR TUNNEL RELEASE  12/15/2011   Procedure: CUBITAL TUNNEL RELEASE;  Surgeon: [name], MD;  Location: [facility];  Service: Orthopedics;  Laterality: Left;  left cubital tunnel release   Past Medical History:  Diagnosis Date   Allergy    Chronic pain due to trauma    Disturbance of skin sensation    Intercostal neuralgia    Lumbosacral neuritis    Myocardial infarction (HCC)    Rotator cuff (capsule) sprain    Snores    BP (!) 150/81   Pulse 87   Temp 98 F (36.7 C) (Oral)   Ht 5\' 9"  (1.753 m)   Wt 238 lb (108 kg)   SpO2 93%   BMI 35.15 kg/m   Opioid Risk Score:   Fall Risk Score:   `1  Depression screen PHQ 2/9  Depression screen [facility] 2/9 08/16/2020 05/17/2020 02/12/2020 01/12/2020 12/15/2019 12/16/2018 11/10/2018  Decreased Interest 0 0 0 0 0 0 0  Down, Depressed, Hopeless 0 0 - 0 0 0 0  PHQ - 2 Score 0 0 0 0 0 0 0  Altered sleeping - - - - - - -  Tired, decreased energy - - - - - - -  Feeling bad or failure about yourself  - - - - - - -  Trouble concentrating - - - - - - -  Moving slowly or fidgety/restless - - - - - - -  Suicidal thoughts - - - - - - -  PHQ-9 Score - - - - - - -     Review of Systems  Musculoskeletal:  Positive for back pain and neck pain.       Hip pain Leg pain Knee pain Shoulder pain Arm pain All on left side of body  All other systems reviewed and are negative.     Objective:   Physical Exam Vitals and nursing note reviewed.  Constitutional:      Appearance: Normal appearance.  Cardiovascular:     Rate and Rhythm: Normal rate and regular rhythm.     Pulses: Normal pulses.     Heart sounds: Normal heart sounds.  Pulmonary:     Effort: Pulmonary effort is normal.     Breath sounds: Normal breath sounds.  Musculoskeletal:     Cervical back: Normal range of motion and neck supple.     Comments: Normal Muscle Bulk and Muscle Testing Reveals:  Upper Extremities: Right: Decreased ROM 90 Degrees and Muscle Strength  5/5 Left: Decreased ROM 45 Degrees and Muscle Strength 5/5 Thoracic Paraspinal Tenderness: T-7-T-9 Lumbar Hypersensitivity Right Greater Trochanter Tenderness Lower Extremities: Right: Lower Extremity Decreased ROM and Muscle Strength 4/5 Right Lower Extremity Flexion Produces Pain into his Lower Back and Right Hip Left Lower Extremity: Decreased ROM and Muscle Strength 5/5 Left Lower Extremity Flexion Produces Pain into his hips and lower back Arises from chair slowly using cane for support Antalgic  Gait     Skin:    General: Skin is warm and dry.  Neurological:     Mental Status: He is alert and oriented to person,  place, and time.  Psychiatric:        Mood and Affect: Mood normal.        Behavior: Behavior normal.         Assessment & Plan:  1. 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 01/08/2021 Refilled: Oxycodone 10 mg one tablet 5 times a day  as needed for pain #150. We will continue the opioid monitoring program, this consists of regular clinic visits, examinations, urine drug screen, pill counts as well as use of [place] Controlled Substance Reporting system. A 12 month History has been reviewed on the [place] Controlled Substance Reporting System 01/08/2021.  2. Lumbar Radiculitis: Continue current medication regimen with Gabapentin. 01/08/2021 3. Intercostal neuralgia: Unable to continue Flector Patch due to cost, Continue current medication regimen with Voltaren Gel, Robaxin and Neurontin. 01/08/2021.  4. Insomnia: Continue current medication regime with trazodone. 01/08/2021 5. Muscle Spasm: Continue current medication regimen with Robaxin. 01/08/2021 6. Polyarthralgia: Continue current medication regime. Continue to Monitor. 01/08/2021 7. Chronic Bilateral Thoracic Pain: Continue HEP and Continue Current medication regimen. Continue to monitor. 01/08/2021 8. Chronic Right Hip Pain/ Avascular Necrosis: Mr. Wedel states he's awaiting a surgery date with Dr. Dion Saucier. S/P Hip Injection by Dr. Maurice Small with relief noted. 01/08/2021 9. Cervical Lymphadenopathy: Endocrinologist Following.  S/P Radioactive Iodine Thyroid Ablation on 05/13/2020,Continue to monitor. 01/08/2021   F/U in 1 month

## 2021-01-08 NOTE — Patient Instructions (Signed)
My Chart Video Visit:  [phone]

## 2021-02-03 ENCOUNTER — Encounter: Payer: Self-pay | Admitting: Registered Nurse

## 2021-02-03 ENCOUNTER — Encounter: Payer: Medicare HMO | Attending: Physical Medicine & Rehabilitation | Admitting: Registered Nurse

## 2021-02-03 ENCOUNTER — Other Ambulatory Visit: Payer: Self-pay

## 2021-02-03 VITALS — Ht 69.0 in | Wt 232.0 lb

## 2021-02-03 DIAGNOSIS — M5416 Radiculopathy, lumbar region: Secondary | ICD-10-CM

## 2021-02-03 DIAGNOSIS — M8700 Idiopathic aseptic necrosis of unspecified bone: Secondary | ICD-10-CM

## 2021-02-03 DIAGNOSIS — M2550 Pain in unspecified joint: Secondary | ICD-10-CM | POA: Diagnosis not present

## 2021-02-03 DIAGNOSIS — M25512 Pain in left shoulder: Secondary | ICD-10-CM | POA: Insufficient documentation

## 2021-02-03 DIAGNOSIS — M25551 Pain in right hip: Secondary | ICD-10-CM | POA: Insufficient documentation

## 2021-02-03 DIAGNOSIS — Z5181 Encounter for therapeutic drug level monitoring: Secondary | ICD-10-CM

## 2021-02-03 DIAGNOSIS — M519 Unspecified thoracic, thoracolumbar and lumbosacral intervertebral disc disorder: Secondary | ICD-10-CM | POA: Diagnosis not present

## 2021-02-03 DIAGNOSIS — G8929 Other chronic pain: Secondary | ICD-10-CM

## 2021-02-03 DIAGNOSIS — Z79891 Long term (current) use of opiate analgesic: Secondary | ICD-10-CM

## 2021-02-03 DIAGNOSIS — G894 Chronic pain syndrome: Secondary | ICD-10-CM

## 2021-02-03 DIAGNOSIS — M546 Pain in thoracic spine: Secondary | ICD-10-CM

## 2021-02-03 MED ORDER — OXYCODONE HCL 10 MG PO TABS
10.0000 mg | ORAL_TABLET | Freq: Every day | ORAL | 0 refills | Status: DC | PRN
Start: 2021-02-03 — End: 2021-03-07

## 2021-02-03 NOTE — Progress Notes (Signed)
Subjective:    Patient ID: Matthew Sloan, male    DOB: [birth year], 60 y.o.   MRN: [id number]  HPI: Matthew Sloan is a 60 y.o. male he has a telephone visit scheduled for today, Matthew Sloan agrees with telephone visit and verbalizes understanding.He states his pain is located in his left shoulder, mis- lower back pain and right hip pain. He also reports generalized pain. He rates his pain 10. His current exercise regime is walking short distances in his home using his cane or crutches and performing light stretching exercises.  Matthew Sloan Morphine equivalent is 75.00  MME. Last Oral Swab was Performed on 09/13/2020, it was consistent.     Pain Inventory Average Pain 8 Pain Right Now 10 My pain is sharp, burning, dull, stabbing, tingling, and aching  In the last 24 hours, has pain interfered with the following? General activity 8 Relation with others 8 Enjoyment of life 8 What TIME of day is your pain at its worst? varies Sleep (in general) Poor  Pain is worse with: walking, bending, sitting, inactivity, standing, and some activites Pain improves with: medication Relief from Meds: 6  Family History  Problem Relation Age of Onset   Cancer Mother 45       breast cancer   Heart disease Father 40       CHF   Social History   Socioeconomic History   Marital status: Single    Spouse name: Not on file   Number of children: Not on file   Years of education: Not on file   Highest education level: Not on file  Occupational History   Not on file  Tobacco Use   Smoking status: Every Day    Packs/day: 1.00    Years: 30.00    Pack years: 30.00    Types: Cigarettes   Smokeless tobacco: Never   Tobacco comments:    working on it-down to 0.5 ppd  Vaping Use   Vaping Use: Never used  Substance and Sexual Activity   Alcohol use: No   Drug use: No   Sexual activity: Not on file  Other Topics Concern   Not on file  Social History Narrative   Not on file   Social Determinants  of Health   Financial Resource Strain: Not on file  Food Insecurity: Not on file  Transportation Needs: Not on file  Physical Activity: Not on file  Stress: Not on file  Social Connections: Not on file   Past Surgical History:  Procedure Laterality Date   bilateral tubes placed in ears  04/12/2012   CARPAL TUNNEL RELEASE  12/15/2011   Procedure: CARPAL TUNNEL RELEASE;  Surgeon: [name], MD;  Location: [facility];  Service: Orthopedics;  Laterality: Left;   MULTIPLE TOOTH EXTRACTIONS     SHOULDER SURGERY  2012   lt   ULNAR TUNNEL RELEASE  12/15/2011   Procedure: CUBITAL TUNNEL RELEASE;  Surgeon: [name], MD;  Location: [facility];  Service: Orthopedics;  Laterality: Left;  left cubital tunnel release   Past Surgical History:  Procedure Laterality Date   bilateral tubes placed in ears  04/12/2012   CARPAL TUNNEL RELEASE  12/15/2011   Procedure: CARPAL TUNNEL RELEASE;  Surgeon: [name], MD;  Location: [facility];  Service: Orthopedics;  Laterality: Left;   MULTIPLE TOOTH EXTRACTIONS     SHOULDER SURGERY  2012   lt   ULNAR TUNNEL RELEASE  12/15/2011  Procedure: CUBITAL TUNNEL RELEASE;  Surgeon: [name], MD;  Location: [facility];  Service: Orthopedics;  Laterality: Left;  left cubital tunnel release   Past Medical History:  Diagnosis Date   Allergy    Chronic pain due to trauma    Disturbance of skin sensation    Intercostal neuralgia    Lumbosacral neuritis    Myocardial infarction (HCC)    Rotator cuff (capsule) sprain    Snores    Ht 5\' 9"  (1.753 m)   Wt 232 lb (105.2 kg)   BMI 34.26 kg/m   Opioid Risk Score:   Fall Risk Score:  `1  Depression screen PHQ 2/9  Depression screen [facility] 2/9 08/16/2020 05/17/2020 02/12/2020 01/12/2020 12/15/2019 12/16/2018 11/10/2018  Decreased Interest 0 0 0 0 0 0 0  Down, Depressed, Hopeless 0 0 - 0 0 0 0  PHQ - 2 Score 0 0 0 0 0 0 0  Altered sleeping - - - - - - -   Tired, decreased energy - - - - - - -  Feeling bad or failure about yourself  - - - - - - -  Trouble concentrating - - - - - - -  Moving slowly or fidgety/restless - - - - - - -  Suicidal thoughts - - - - - - -  PHQ-9 Score - - - - - - -     Review of Systems  Musculoskeletal:  Positive for back pain.       Hip pain Leg pain Left shoulder pain  All other systems reviewed and are negative.     Objective:   Physical Exam Vitals and nursing note reviewed.  Constitutional:      Appearance: Normal appearance.  Musculoskeletal:     Comments: No Physical Exam Performed         Assessment & Plan:  . 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 02/03/2021 Refilled: Oxycodone 10 mg one tablet 5 times a day  as needed for pain #150. We will continue the opioid monitoring program, this consists of regular clinic visits, examinations, urine drug screen, pill counts as well as use of 02/05/2021 Controlled Substance Reporting system. A 12 month History has been reviewed on the [place] Controlled Substance Reporting System 02/03/2021.  2. Lumbar Radiculitis: Continue current medication regimen with Gabapentin. 02/03/2021 3. Intercostal neuralgia: Unable to continue Flector Patch due to cost, Continue current medication regimen with Voltaren Gel, Robaxin and Neurontin. 02/03/2021.  4. Insomnia: Continue current medication regime with trazodone. 02/03/2021 5. Muscle Spasm: Continue current medication regimen with Robaxin. 02/03/2021 6. Polyarthralgia: Continue current medication regime. Continue to Monitor. 02/03/2021 7. Chronic Bilateral Thoracic Pain: Continue HEP and Continue Current medication regimen. Continue to monitor. 02/03/2021 8. Chronic Right Hip Pain/ Avascular Necrosis: Matthew Sloan states he's awaiting a surgery date with Dr. Dan Humphreys. S/P Hip Injection by  Dr. Dion Saucier with relief noted. 02/03/2021 9. Cervical Lymphadenopathy: Endocrinologist Following.  S/P Radioactive Iodine Thyroid Ablation on 05/13/2020,Continue to monitor. 02/03/2021   F/U in 1 month Telephone Visit Established Patient Location of Patient: In his Home Location of Provider: In the Office Total Time Spent: 10 Minutes

## 2021-03-07 ENCOUNTER — Encounter: Payer: Self-pay | Admitting: Registered Nurse

## 2021-03-07 ENCOUNTER — Encounter: Payer: Medicare HMO | Attending: Physical Medicine & Rehabilitation | Admitting: Registered Nurse

## 2021-03-07 ENCOUNTER — Other Ambulatory Visit: Payer: Self-pay

## 2021-03-07 VITALS — Temp 102.0°F | Ht 69.0 in | Wt 232.0 lb

## 2021-03-07 DIAGNOSIS — M8700 Idiopathic aseptic necrosis of unspecified bone: Secondary | ICD-10-CM | POA: Insufficient documentation

## 2021-03-07 DIAGNOSIS — Z79891 Long term (current) use of opiate analgesic: Secondary | ICD-10-CM | POA: Insufficient documentation

## 2021-03-07 DIAGNOSIS — M2550 Pain in unspecified joint: Secondary | ICD-10-CM | POA: Insufficient documentation

## 2021-03-07 DIAGNOSIS — M519 Unspecified thoracic, thoracolumbar and lumbosacral intervertebral disc disorder: Secondary | ICD-10-CM | POA: Diagnosis not present

## 2021-03-07 DIAGNOSIS — M25512 Pain in left shoulder: Secondary | ICD-10-CM | POA: Insufficient documentation

## 2021-03-07 DIAGNOSIS — Z5181 Encounter for therapeutic drug level monitoring: Secondary | ICD-10-CM | POA: Insufficient documentation

## 2021-03-07 DIAGNOSIS — M25551 Pain in right hip: Secondary | ICD-10-CM | POA: Insufficient documentation

## 2021-03-07 DIAGNOSIS — M5416 Radiculopathy, lumbar region: Secondary | ICD-10-CM | POA: Insufficient documentation

## 2021-03-07 DIAGNOSIS — G8929 Other chronic pain: Secondary | ICD-10-CM | POA: Insufficient documentation

## 2021-03-07 DIAGNOSIS — M25511 Pain in right shoulder: Secondary | ICD-10-CM | POA: Insufficient documentation

## 2021-03-07 DIAGNOSIS — M546 Pain in thoracic spine: Secondary | ICD-10-CM | POA: Insufficient documentation

## 2021-03-07 DIAGNOSIS — G894 Chronic pain syndrome: Secondary | ICD-10-CM | POA: Insufficient documentation

## 2021-03-07 MED ORDER — OXYCODONE HCL 10 MG PO TABS: 10.0000 mg | ORAL_TABLET | Freq: Every day | ORAL | 0 refills | Status: DC | PRN

## 2021-03-07 NOTE — Progress Notes (Signed)
Subjective:    Patient ID: Matthew Sloan, male    DOB: Dec 16, 1960, 60 y.o.   MRN: [id number]  HPI: Matthew Sloan is a 60 y.o. male whose appointment was changed to a telephone visit, he was diagnosed with COVID, pcp following. He agrees with the telephone visit, he verbalizes understanding. He states his  pain is located in his bilateral shoulders L>R, mid- lower back pain, Bilateral hips and reports generalized joint pain. He rates his pain 10. His current exercise regime is walking and performing stretching exercises with bands.  Matthew Sloan Morphine equivalent is  75. .   Last Oral Swab was Performed on 09/13/2020, it was consistent.     Pain Inventory Average Pain 10 Pain Right Now 10 My pain is constant, sharp, stabbing, and aching  In the last 24 hours, has pain interfered with the following? General activity 10 Relation with others 10 Enjoyment of life 10 What TIME of day is your pain at its worst? morning , daytime, evening, and night Sleep (in general) Poor  Pain is worse with: walking, bending, sitting, standing, and some activites Pain improves with: rest and medication Relief from Meds: 2  Family History  Problem Relation Age of Onset   Cancer Mother 34       breast cancer   Heart disease Father 110       CHF   Social History   Socioeconomic History   Marital status: Single    Spouse name: Not on file   Number of children: Not on file   Years of education: Not on file   Highest education level: Not on file  Occupational History   Not on file  Tobacco Use   Smoking status: Every Day    Packs/day: 1.00    Years: 30.00    Pack years: 30.00    Types: Cigarettes   Smokeless tobacco: Never   Tobacco comments:    working on it-down to 0.5 ppd  Vaping Use   Vaping Use: Never used  Substance and Sexual Activity   Alcohol use: No   Drug use: No   Sexual activity: Not on file  Other Topics Concern   Not on file  Social History Narrative   Not on file    Social Determinants of Health   Financial Resource Strain: Not on file  Food Insecurity: Not on file  Transportation Needs: Not on file  Physical Activity: Not on file  Stress: Not on file  Social Connections: Not on file   Past Surgical History:  Procedure Laterality Date   bilateral tubes placed in ears  04/12/2012   CARPAL TUNNEL RELEASE  12/15/2011   Procedure: CARPAL TUNNEL RELEASE;  Surgeon: [name], MD;  Location: [facility];  Service: Orthopedics;  Laterality: Left;   MULTIPLE TOOTH EXTRACTIONS     SHOULDER SURGERY  2012   lt   ULNAR TUNNEL RELEASE  12/15/2011   Procedure: CUBITAL TUNNEL RELEASE;  Surgeon: [name], MD;  Location: [facility];  Service: Orthopedics;  Laterality: Left;  left cubital tunnel release   Past Surgical History:  Procedure Laterality Date   bilateral tubes placed in ears  04/12/2012   CARPAL TUNNEL RELEASE  12/15/2011   Procedure: CARPAL TUNNEL RELEASE;  Surgeon: [name], MD;  Location: [facility];  Service: Orthopedics;  Laterality: Left;   MULTIPLE TOOTH EXTRACTIONS     SHOULDER SURGERY  2012   lt   ULNAR  TUNNEL RELEASE  12/15/2011   Procedure: CUBITAL TUNNEL RELEASE;  Surgeon: [name], MD;  Location: [facility];  Service: Orthopedics;  Laterality: Left;  left cubital tunnel release   Past Medical History:  Diagnosis Date   Allergy    Chronic pain due to trauma    Disturbance of skin sensation    Intercostal neuralgia    Lumbosacral neuritis    Myocardial infarction (HCC)    Rotator cuff (capsule) sprain    Snores    Temp (!) 102 F (38.9 C) Comment: reported last from pm  Ht 5\' 9"  (1.753 m) Comment: reported  Wt 232 lb (105.2 kg) Comment: last recorded  BMI 34.26 kg/m   Opioid Risk Score:   Fall Risk Score:  `1  Depression screen PHQ 2/9  Depression screen [facility] 2/9 03/07/2021 08/16/2020 05/17/2020 02/12/2020 01/12/2020 12/15/2019 12/16/2018  Decreased Interest 0 0  0 0 0 0 0  Down, Depressed, Hopeless 0 0 0 - 0 0 0  PHQ - 2 Score 0 0 0 0 0 0 0  Altered sleeping - - - - - - -  Tired, decreased energy - - - - - - -  Feeling bad or failure about yourself  - - - - - - -  Trouble concentrating - - - - - - -  Moving slowly or fidgety/restless - - - - - - -  Suicidal thoughts - - - - - - -  PHQ-9 Score - - - - - - -     Review of Systems  Constitutional:  Positive for fatigue and fever.  HENT: Negative.    Eyes: Negative.   Respiratory:  Positive for cough.        Covid +  Cardiovascular: Negative.   Gastrointestinal:  Positive for diarrhea.  Endocrine: Negative.   Genitourinary: Negative.   Musculoskeletal:  Positive for back pain and gait problem.  Skin: Negative.   Allergic/Immunologic: Negative.   Hematological: Negative.   Psychiatric/Behavioral: Negative.    All other systems reviewed and are negative.     Objective:   Physical Exam Vitals and nursing note reviewed.  Musculoskeletal:     Comments: No Physical Exam Performed: Telephone Visit         Assessment & Plan:  1. 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 03/07/2021 Refilled: Oxycodone 10 mg one tablet 5 times a day  as needed for pain #150. We will continue the opioid monitoring program, this consists of regular clinic visits, examinations, urine drug screen, pill counts as well as use of 03/09/2021 Controlled Substance Reporting system. A 12 month History has been reviewed on the [place] Controlled Substance Reporting System 03/07/2021.  2. Lumbar Radiculitis: Continue current medication regimen with Gabapentin. 03/07/2021 3. Intercostal neuralgia: Unable to continue Flector Patch due to cost, Continue current medication regimen with Voltaren Gel, Robaxin and Neurontin. 03/07/2021.  4. Insomnia: Continue current medication regime with trazodone.  03/07/2021 5. Muscle Spasm: Continue current medication regimen with Robaxin. 03/07/2021 6. Polyarthralgia: Continue current medication regime. Continue to Monitor. 03/07/2021 7. Chronic Bilateral Thoracic Pain: Continue HEP and Continue Current medication regimen. Continue to monitor. 03/07/2021 8. Chronic Right Hip Pain/ Avascular Necrosis: Matthew Sloan states he's awaiting a surgery date with Dr. Dan Humphreys. S/P Hip Injection by Dr. Dion Saucier with relief noted. 03/07/2021 9. Cervical Lymphadenopathy: Endocrinologist Following.  S/P Radioactive Iodine Thyroid Ablation on 05/13/2020,Continue  to monitor. 03/07/2021   F/U in 1 month Telephone Visit Established Patient Location of Patient: In his Home Location of Provider: In the Office Total Time Spent: 10 Minutes

## 2021-04-08 ENCOUNTER — Encounter: Payer: Medicare HMO | Admitting: Registered Nurse

## 2021-04-17 ENCOUNTER — Encounter: Payer: Medicare HMO | Admitting: Registered Nurse

## 2021-04-18 ENCOUNTER — Encounter: Payer: Medicare HMO | Admitting: Registered Nurse

## 2021-04-21 ENCOUNTER — Telehealth: Payer: Self-pay

## 2021-04-21 ENCOUNTER — Encounter: Payer: Medicare HMO | Attending: Physical Medicine & Rehabilitation | Admitting: Registered Nurse

## 2021-04-21 ENCOUNTER — Other Ambulatory Visit: Payer: Self-pay

## 2021-04-21 ENCOUNTER — Encounter: Payer: Self-pay | Admitting: Registered Nurse

## 2021-04-21 VITALS — BP 144/86 | HR 82 | Temp 97.8°F | Ht 69.0 in | Wt 237.0 lb

## 2021-04-21 DIAGNOSIS — M5416 Radiculopathy, lumbar region: Secondary | ICD-10-CM | POA: Insufficient documentation

## 2021-04-21 DIAGNOSIS — Z79891 Long term (current) use of opiate analgesic: Secondary | ICD-10-CM | POA: Diagnosis not present

## 2021-04-21 DIAGNOSIS — Z5181 Encounter for therapeutic drug level monitoring: Secondary | ICD-10-CM | POA: Insufficient documentation

## 2021-04-21 DIAGNOSIS — M2550 Pain in unspecified joint: Secondary | ICD-10-CM | POA: Insufficient documentation

## 2021-04-21 DIAGNOSIS — E890 Postprocedural hypothyroidism: Secondary | ICD-10-CM

## 2021-04-21 DIAGNOSIS — R42 Dizziness and giddiness: Secondary | ICD-10-CM | POA: Diagnosis present

## 2021-04-21 DIAGNOSIS — M8700 Idiopathic aseptic necrosis of unspecified bone: Secondary | ICD-10-CM | POA: Diagnosis present

## 2021-04-21 DIAGNOSIS — G894 Chronic pain syndrome: Secondary | ICD-10-CM | POA: Diagnosis present

## 2021-04-21 DIAGNOSIS — M25511 Pain in right shoulder: Secondary | ICD-10-CM | POA: Insufficient documentation

## 2021-04-21 DIAGNOSIS — M25512 Pain in left shoulder: Secondary | ICD-10-CM | POA: Insufficient documentation

## 2021-04-21 DIAGNOSIS — M546 Pain in thoracic spine: Secondary | ICD-10-CM | POA: Insufficient documentation

## 2021-04-21 DIAGNOSIS — G8929 Other chronic pain: Secondary | ICD-10-CM | POA: Insufficient documentation

## 2021-04-21 DIAGNOSIS — M519 Unspecified thoracic, thoracolumbar and lumbosacral intervertebral disc disorder: Secondary | ICD-10-CM | POA: Diagnosis present

## 2021-04-21 MED ORDER — OXYCODONE HCL 10 MG PO TABS: 10.0000 mg | ORAL_TABLET | Freq: Every day | ORAL | 0 refills | Status: DC | PRN

## 2021-04-21 NOTE — Telephone Encounter (Signed)
Patient said he was recently was in the hospital for dizzy spells. He is calling to see if they checked his thyroid because they thought it was for that. He will call back and let us know , if not he will need to complete labs and see [place] in office.

## 2021-04-21 NOTE — Progress Notes (Signed)
Subjective:    Patient ID: Matthew Sloan, male    DOB: 01-Dec-1960, 60 y.o.   MRN: [id number]  HPI: Matthew Sloan is a 60 y.o. male who returns for follow up appointment for chronic pain and medication refill. He states his  pain is located in his bilateral shoulders L>R, mid- lower back pain and bilateral hips R>L. He rates his pain 9. His current exercise regime is walking and performing stretching exercises.  Matthew Sloan states he is still awaiting a return call from Dr. Dion Saucier office regarding a surgical date, he states he will call them today.   Matthew Sloan Morphine equivalent is 60.00  MME.   Oral Swab was Performed today.   Matthew Sloan states he went to the Emergency room on 04/19/2021 at [facility] in [place] for Dizziness. He reports he had blood work abd CT Scan. He has a F/U appointment with his PCP on 04/24/2021.   Today he reports he has minimal amount of dizziness today, orthostatic blood pressures were obtain, he has a PCP appointment on 04/24/2021. He was instructed to keep a blood pressure journal, he verbalizes understanding.     Pain Inventory Average Pain 9 Pain Right Now 9 My pain is constant, sharp, burning, stabbing, tingling, and aching  In the last 24 hours, has pain interfered with the following? General activity 10 Relation with others 8 Enjoyment of life 8 What TIME of day is your pain at its worst? morning , daytime, evening, and night Sleep (in general) Poor  Pain is worse with: walking, bending, sitting, inactivity, and standing Pain improves with: medication Relief from Meds: 2  Family History  Problem Relation Age of Onset   Cancer Mother 59       breast cancer   Heart disease Father 61       CHF   Social History   Socioeconomic History   Marital status: Single    Spouse name: Not on file   Number of children: Not on file   Years of education: Not on file   Highest education level: Not on file  Occupational History   Not on file  Tobacco Use    Smoking status: Every Day    Packs/day: 1.00    Years: 30.00    Pack years: 30.00    Types: Cigarettes   Smokeless tobacco: Never   Tobacco comments:    working on it-down to 0.5 ppd  Vaping Use   Vaping Use: Never used  Substance and Sexual Activity   Alcohol use: No   Drug use: No   Sexual activity: Not on file  Other Topics Concern   Not on file  Social History Narrative   Not on file   Social Determinants of Health   Financial Resource Strain: Not on file  Food Insecurity: Not on file  Transportation Needs: Not on file  Physical Activity: Not on file  Stress: Not on file  Social Connections: Not on file   Past Surgical History:  Procedure Laterality Date   bilateral tubes placed in ears  04/12/2012   CARPAL TUNNEL RELEASE  12/15/2011   Procedure: CARPAL TUNNEL RELEASE;  Surgeon: [name], MD;  Location: [facility];  Service: Orthopedics;  Laterality: Left;   MULTIPLE TOOTH EXTRACTIONS     SHOULDER SURGERY  2012   lt   ULNAR TUNNEL RELEASE  12/15/2011   Procedure: CUBITAL TUNNEL RELEASE;  Surgeon: [name], MD;  Location: [facility];  Service: Orthopedics;  Laterality: Left;  left cubital tunnel release   Past Surgical History:  Procedure Laterality Date   bilateral tubes placed in ears  04/12/2012   CARPAL TUNNEL RELEASE  12/15/2011   Procedure: CARPAL TUNNEL RELEASE;  Surgeon: [name], MD;  Location: [facility];  Service: Orthopedics;  Laterality: Left;   MULTIPLE TOOTH EXTRACTIONS     SHOULDER SURGERY  2012   lt   ULNAR TUNNEL RELEASE  12/15/2011   Procedure: CUBITAL TUNNEL RELEASE;  Surgeon: [name], MD;  Location: [facility];  Service: Orthopedics;  Laterality: Left;  left cubital tunnel release   Past Medical History:  Diagnosis Date   Allergy    Chronic pain due to trauma    Disturbance of skin sensation    Intercostal neuralgia    Lumbosacral neuritis    Myocardial infarction  (HCC)    Rotator cuff (capsule) sprain    Snores    BP (!) 151/88   Pulse 76   Temp 97.8 F (36.6 C) (Oral)   Ht 5\' 9"  (1.753 m)   Wt 237 lb (107.5 kg)   SpO2 93%   BMI 35.00 kg/m   Opioid Risk Score:   Fall Risk Score:  `1  Depression screen PHQ 2/9  Depression screen [facility] 2/9 03/07/2021 08/16/2020 05/17/2020 02/12/2020 01/12/2020 12/15/2019 12/16/2018  Decreased Interest 0 0 0 0 0 0 0  Down, Depressed, Hopeless 0 0 0 - 0 0 0  PHQ - 2 Score 0 0 0 0 0 0 0  Altered sleeping - - - - - - -  Tired, decreased energy - - - - - - -  Feeling bad or failure about yourself  - - - - - - -  Trouble concentrating - - - - - - -  Moving slowly or fidgety/restless - - - - - - -  Suicidal thoughts - - - - - - -  PHQ-9 Score - - - - - - -     Review of Systems  Musculoskeletal:  Positive for back pain.       Right hip and leg pain all the way down Left arm pain Left shoulder pain  Neurological:  Positive for weakness.  All other systems reviewed and are negative.     Objective:   Physical Exam Vitals and nursing note reviewed.  Constitutional:      Appearance: Normal appearance.  Cardiovascular:     Rate and Rhythm: Normal rate and regular rhythm.     Pulses: Normal pulses.     Heart sounds: Normal heart sounds.  Pulmonary:     Effort: Pulmonary effort is normal.     Breath sounds: Normal breath sounds.  Musculoskeletal:     Cervical back: Normal range of motion and neck supple.     Comments: Normal Muscle Bulk and Muscle Testing Reveals:  Upper Extremities: Right: Decreased ROM 45 Degrees and Muscle Strength 4/5 Left: Decreased ROM 90 Degrees and Muscle Strength  5/5 Thoracic and Lumbar Hypersensitivity Right Greater Trochanter Tenderness Lower Extremities: Decreased ROM and Muscle Strength 4/5 Bilateral Lower Extremities Flexion Produces Pain into his Bilateral Lower Extremities Arises from Table slowly using cane for support Antalgic  Gait     Skin:    General: Skin is warm and  dry.  Neurological:     Mental Status: He is alert and oriented to person, place, and time.  Psychiatric:        Mood and  Affect: Mood normal.        Behavior: Behavior normal.         Assessment & Plan:  1. 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 04/21/2021 Refilled: Oxycodone 10 mg one tablet 5 times a day  as needed for pain #150. We will continue the opioid monitoring program, this consists of regular clinic visits, examinations, urine drug screen, pill counts as well as use of [place] Controlled Substance Reporting system. A 12 month History has been reviewed on the [place] Controlled Substance Reporting System 04/21/2021.  2. Lumbar Radiculitis: Continue current medication regimen with Gabapentin. 04/21/2021 3. Intercostal neuralgia: Unable to continue Flector Patch due to cost, Continue current medication regimen with Voltaren Gel, Robaxin and Neurontin. 04/21/2021.  4. Insomnia: Continue current medication regime with trazodone. 04/21/2021 5. Muscle Spasm: Continue current medication regimen with Robaxin. 04/21/2021 6. Polyarthralgia: Continue current medication regime. Continue to Monitor. 04/21/2021 7. Chronic Bilateral Thoracic Pain: Continue HEP and Continue Current medication regimen. Continue to monitor. 04/21/2021 8. Chronic Right Hip Pain/ Avascular Necrosis: Matthew Sloan states he's awaiting a surgery date with Dr. Dion Saucier. S/P Hip Injection by Dr. Maurice Small with relief noted. 04/21/2021 9. Cervical Lymphadenopathy: Endocrinologist Following.  S/P Radioactive Iodine Thyroid Ablation on 05/13/2020,Continue to monitor. 04/21/2021   F/U in 1 month

## 2021-04-28 LAB — DRUG TOX MONITOR 1 W/CONF, ORAL FLD
Amphetamines: NEGATIVE ng/mL (ref <10)
Barbiturates: NEGATIVE ng/mL (ref <10)
Benzodiazepines: NEGATIVE ng/mL (ref <0.50)
Buprenorphine: NEGATIVE ng/mL (ref <0.10)
Cocaine: NEGATIVE ng/mL (ref <5.0)
Codeine: NEGATIVE ng/mL (ref <2.5)
Cotinine: >250 ng/mL — ABNORMAL HIGH (ref <5.0)
Dihydrocodeine: NEGATIVE ng/mL (ref <2.5)
Fentanyl: NEGATIVE ng/mL (ref <0.10)
Heroin Metabolite: NEGATIVE ng/mL (ref <1.0)
Hydrocodone: NEGATIVE ng/mL (ref <2.5)
Hydromorphone: NEGATIVE ng/mL (ref <2.5)
MARIJUANA: NEGATIVE ng/mL (ref <2.5)
MDMA: NEGATIVE ng/mL (ref <10)
Meprobamate: NEGATIVE ng/mL (ref <2.5)
Methadone: NEGATIVE ng/mL (ref <5.0)
Morphine: NEGATIVE ng/mL (ref <2.5)
Nicotine Metabolite: POSITIVE ng/mL — AB (ref <5.0)
Norhydrocodone: NEGATIVE ng/mL (ref <2.5)
Noroxycodone: 38 ng/mL — ABNORMAL HIGH (ref <2.5)
Opiates: POSITIVE ng/mL — AB (ref <2.5)
Oxycodone: >250 ng/mL — ABNORMAL HIGH (ref <2.5)
Oxymorphone: 3.3 ng/mL — ABNORMAL HIGH (ref <2.5)
Phencyclidine: NEGATIVE ng/mL (ref <10)
Tapentadol: NEGATIVE ng/mL (ref <5.0)
Tramadol: NEGATIVE ng/mL (ref <5.0)
Zolpidem: NEGATIVE ng/mL (ref <5.0)

## 2021-04-28 LAB — DRUG TOX ALC METAB W/CON, ORAL FLD: Alcohol Metabolite: NEGATIVE ng/mL (ref <25)

## 2021-04-28 NOTE — Telephone Encounter (Signed)
Mailed lab order.

## 2021-04-30 ENCOUNTER — Telehealth: Payer: Self-pay | Admitting: *Deleted

## 2021-04-30 NOTE — Telephone Encounter (Signed)
Oral swab drug screen was consistent for prescribed medications.  ?

## 2021-06-19 ENCOUNTER — Encounter: Payer: Medicare HMO | Admitting: Registered Nurse

## 2021-06-24 ENCOUNTER — Ambulatory Visit: Payer: Medicare HMO | Admitting: Registered Nurse

## 2021-06-30 ENCOUNTER — Ambulatory Visit: Payer: Medicare HMO | Admitting: Registered Nurse

## 2021-07-01 ENCOUNTER — Other Ambulatory Visit: Payer: Self-pay

## 2021-07-01 ENCOUNTER — Encounter: Payer: Medicare HMO | Attending: Physical Medicine & Rehabilitation | Admitting: Registered Nurse

## 2021-07-01 ENCOUNTER — Encounter: Payer: Self-pay | Admitting: Registered Nurse

## 2021-07-01 VITALS — BP 130/82 | HR 77 | Temp 97.9°F | Ht 69.0 in | Wt 253.0 lb

## 2021-07-01 DIAGNOSIS — M5416 Radiculopathy, lumbar region: Secondary | ICD-10-CM

## 2021-07-01 DIAGNOSIS — Z79891 Long term (current) use of opiate analgesic: Secondary | ICD-10-CM

## 2021-07-01 DIAGNOSIS — M546 Pain in thoracic spine: Secondary | ICD-10-CM | POA: Diagnosis present

## 2021-07-01 DIAGNOSIS — M2550 Pain in unspecified joint: Secondary | ICD-10-CM | POA: Diagnosis not present

## 2021-07-01 DIAGNOSIS — M25512 Pain in left shoulder: Secondary | ICD-10-CM | POA: Insufficient documentation

## 2021-07-01 DIAGNOSIS — M519 Unspecified thoracic, thoracolumbar and lumbosacral intervertebral disc disorder: Secondary | ICD-10-CM | POA: Diagnosis present

## 2021-07-01 DIAGNOSIS — Z5181 Encounter for therapeutic drug level monitoring: Secondary | ICD-10-CM

## 2021-07-01 DIAGNOSIS — G8929 Other chronic pain: Secondary | ICD-10-CM

## 2021-07-01 DIAGNOSIS — G894 Chronic pain syndrome: Secondary | ICD-10-CM | POA: Diagnosis present

## 2021-07-01 DIAGNOSIS — M8700 Idiopathic aseptic necrosis of unspecified bone: Secondary | ICD-10-CM | POA: Diagnosis present

## 2021-07-01 MED ORDER — OXYCODONE HCL 10 MG PO TABS: 10.0000 mg | ORAL_TABLET | Freq: Every day | ORAL | 0 refills | Status: DC | PRN

## 2021-07-01 NOTE — Progress Notes (Deleted)
Subjective:    Patient ID: Matthew Sloan, male    DOB: September 17, 1960, 60 y.o.   MRN: [id number]  HPI   Pain Inventory Average Pain {NUMBERS; 0-10:5044} Pain Right Now {NUMBERS; 0-10:5044} My pain is {PAIN DESCRIPTION:21022940}  In the last 24 hours, has pain interfered with the following? General activity {NUMBERS; 0-10:5044} Relation with others {NUMBERS; 0-10:5044} Enjoyment of life {NUMBERS; 0-10:5044} What TIME of day is your pain at its worst? {time of day:24191} Sleep (in general) {BHH GOOD/FAIR/POOR:22877}  Pain is worse with: {ACTIVITIES:21022942} Pain improves with: {PAIN IMPROVES JQBH:41937902} Relief from Meds: {NUMBERS; 0-10:5044}  Family History  Problem Relation Age of Onset   Cancer Mother 20       breast cancer   Heart disease Father 64       CHF   Social History   Socioeconomic History   Marital status: Single    Spouse name: Not on file   Number of children: Not on file   Years of education: Not on file   Highest education level: Not on file  Occupational History   Not on file  Tobacco Use   Smoking status: Every Day    Packs/day: 1.00    Years: 30.00    Pack years: 30.00    Types: Cigarettes   Smokeless tobacco: Never   Tobacco comments:    working on it-down to 0.5 ppd  Vaping Use   Vaping Use: Never used  Substance and Sexual Activity   Alcohol use: No   Drug use: No   Sexual activity: Not on file  Other Topics Concern   Not on file  Social History Narrative   Not on file   Social Determinants of Health   Financial Resource Strain: Not on file  Food Insecurity: Not on file  Transportation Needs: Not on file  Physical Activity: Not on file  Stress: Not on file  Social Connections: Not on file   Past Surgical History:  Procedure Laterality Date   bilateral tubes placed in ears  04/12/2012   CARPAL TUNNEL RELEASE  12/15/2011   Procedure: CARPAL TUNNEL RELEASE;  Surgeon: [name], MD;  Location: [facility];   Service: Orthopedics;  Laterality: Left;   MULTIPLE TOOTH EXTRACTIONS     SHOULDER SURGERY  2012   lt   ULNAR TUNNEL RELEASE  12/15/2011   Procedure: CUBITAL TUNNEL RELEASE;  Surgeon: [name], MD;  Location: [facility];  Service: Orthopedics;  Laterality: Left;  left cubital tunnel release   Past Surgical History:  Procedure Laterality Date   bilateral tubes placed in ears  04/12/2012   CARPAL TUNNEL RELEASE  12/15/2011   Procedure: CARPAL TUNNEL RELEASE;  Surgeon: [name], MD;  Location: [facility];  Service: Orthopedics;  Laterality: Left;   MULTIPLE TOOTH EXTRACTIONS     SHOULDER SURGERY  2012   lt   ULNAR TUNNEL RELEASE  12/15/2011   Procedure: CUBITAL TUNNEL RELEASE;  Surgeon: [name], MD;  [facility];  Service: Orthopedics;  Laterality: Left;  left cubital tunnel release   Past Medical History:  Diagnosis Date   Allergy    Chronic pain due to trauma    Disturbance of skin sensation    Intercostal neuralgia    Lumbosacral neuritis    Myocardial infarction (HCC)    Rotator cuff (capsule) sprain    Snores    There were no vitals taken for this visit.  Opioid  Risk Score:   Fall Risk Score:  `1  Depression screen PHQ 2/9  Depression screen [facility] 2/9 03/07/2021 08/16/2020 05/17/2020 02/12/2020 01/12/2020 12/15/2019 12/16/2018  Decreased Interest 0 0 0 0 0 0 0  Down, Depressed, Hopeless 0 0 0 - 0 0 0  PHQ - 2 Score 0 0 0 0 0 0 0  Altered sleeping - - - - - - -  Tired, decreased energy - - - - - - -  Feeling bad or failure about yourself  - - - - - - -  Trouble concentrating - - - - - - -  Moving slowly or fidgety/restless - - - - - - -  Suicidal thoughts - - - - - - -  PHQ-9 Score - - - - - - -    Review of Systems     Objective:   Physical Exam        Assessment & Plan:

## 2021-07-01 NOTE — Progress Notes (Signed)
Subjective:    Patient ID: Matthew Sloan, male    DOB: 1960-08-11, 60 y.o.   MRN: [id number]  HPI: Matthew Sloan is a 60 y.o. male who returns for follow up appointment for chronic pain and medication refill. He states his pain is located in his bilateral shoulders L>R, mid- lower back pain radiating into his right hip pain and bilateral lower extremities. He rates his pain 9. His current exercise regime is walking short distances in his home and performing stretching exercises with his bands  Mr. Hora Morphine equivalent is  55. .   Oral Swab was Performed Today.    Pain Inventory Average Pain 9 Pain Right Now 9 My pain is constant, sharp, burning, stabbing, tingling, and aching  In the last 24 hours, has pain interfered with the following? General activity 10 Relation with others 8 Enjoyment of life 8 What TIME of day is your pain at its worst? morning , daytime, evening, and night Sleep (in general) Poor  Pain is worse with: walking, bending, sitting, inactivity, and standing Pain improves with: medication Relief from Meds: 2  Family History  Problem Relation Age of Onset   Cancer Mother 23       breast cancer   Heart disease Father 64       CHF   Social History   Socioeconomic History   Marital status: Single    Spouse name: Not on file   Number of children: Not on file   Years of education: Not on file   Highest education level: Not on file  Occupational History   Not on file  Tobacco Use   Smoking status: Every Day    Packs/day: 1.00    Years: 30.00    Pack years: 30.00    Types: Cigarettes   Smokeless tobacco: Never   Tobacco comments:    working on it-down to 0.5 ppd  Vaping Use   Vaping Use: Never used  Substance and Sexual Activity   Alcohol use: No   Drug use: No   Sexual activity: Not on file  Other Topics Concern   Not on file  Social History Narrative   Not on file   Social Determinants of Health   Financial Resource Strain: Not on  file  Food Insecurity: Not on file  Transportation Needs: Not on file  Physical Activity: Not on file  Stress: Not on file  Social Connections: Not on file   Past Surgical History:  Procedure Laterality Date   bilateral tubes placed in ears  04/12/2012   CARPAL TUNNEL RELEASE  12/15/2011   Procedure: CARPAL TUNNEL RELEASE;  Surgeon: [name], MD;  Location: [facility];  Service: Orthopedics;  Laterality: Left;   MULTIPLE TOOTH EXTRACTIONS     SHOULDER SURGERY  2012   lt   ULNAR TUNNEL RELEASE  12/15/2011   Procedure: CUBITAL TUNNEL RELEASE;  Surgeon: [name], MD;  Location: [facility];  Service: Orthopedics;  Laterality: Left;  left cubital tunnel release   Past Surgical History:  Procedure Laterality Date   bilateral tubes placed in ears  04/12/2012   CARPAL TUNNEL RELEASE  12/15/2011   Procedure: CARPAL TUNNEL RELEASE;  Surgeon: [name], MD;  Location: [facility];  Service: Orthopedics;  Laterality: Left;   MULTIPLE TOOTH EXTRACTIONS     SHOULDER SURGERY  2012   lt   ULNAR TUNNEL RELEASE  12/15/2011   Procedure: CUBITAL TUNNEL RELEASE;  Surgeon: [name], MD;  Location: [facility];  Service: Orthopedics;  Laterality: Left;  left cubital tunnel release   Past Medical History:  Diagnosis Date   Allergy    Chronic pain due to trauma    Disturbance of skin sensation    Intercostal neuralgia    Lumbosacral neuritis    Myocardial infarction (HCC)    Rotator cuff (capsule) sprain    Snores    Ht 5\' 9"  (1.753 m)    Wt 253 lb (114.8 kg)    BMI 37.36 kg/m   Opioid Risk Score:   Fall Risk Score:  `1  Depression screen PHQ 2/9  Depression screen [facility] 2/9 07/01/2021 03/07/2021 08/16/2020 05/17/2020 02/12/2020 01/12/2020 12/15/2019  Decreased Interest 0 0 0 0 0 0 0  Down, Depressed, Hopeless 0 0 0 0 - 0 0  PHQ - 2 Score 0 0 0 0 0 0 0  Altered sleeping - - - - - - -  Tired, decreased energy - - - - - - -  Feeling  bad or failure about yourself  - - - - - - -  Trouble concentrating - - - - - - -  Moving slowly or fidgety/restless - - - - - - -  Suicidal thoughts - - - - - - -  PHQ-9 Score - - - - - - -    Review of Systems  Musculoskeletal:  Positive for arthralgias, back pain and gait problem.  All other systems reviewed and are negative.     Objective:   Physical Exam Vitals and nursing note reviewed.  Constitutional:      Appearance: Normal appearance.  Cardiovascular:     Rate and Rhythm: Normal rate and regular rhythm.     Pulses: Normal pulses.     Heart sounds: Normal heart sounds.  Pulmonary:     Effort: Pulmonary effort is normal.     Breath sounds: Normal breath sounds.  Musculoskeletal:     Cervical back: Normal range of motion and neck supple.     Comments: Normal Muscle Bulk and Muscle Testing Reveals:  Upper Extremities: Right: Full ROM and Muscle Strength 5/5 Left Upper Extremity: Decreased ROM 45 Degrees Bilateral AC Joint Tenderness  Thoracic and Lumbar Hypersensitivity Lower Extremities: Right: Decreased ROM and Muscle Strength 4/5 Right Lower Extremity Flexion Produces Pain into into his Lumbar and Bilateral Lower Extremities Left Lower Extremity: Full ROM and Muscle Strength 5/5 Arises from Chair Slowly using cane for support Antalgic Gait     Skin:    General: Skin is warm and dry.  Neurological:     Mental Status: He is alert and oriented to person, place, and time.  Psychiatric:        Mood and Affect: Mood normal.        Behavior: Behavior normal.         Assessment & Plan:  1. 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 07/01/2021 Refilled: Oxycodone 10 mg one tablet 5 times a day  as needed for pain #150. We will continue the opioid monitoring program, this consists of regular clinic visits, examinations, urine drug screen, pill counts  as well as use of 07/03/2021 Controlled Substance Reporting system. A 12 month History has been reviewed on the [place] Controlled Substance Reporting System 07/01/2021.  2. Lumbar Radiculitis: Continue current medication regimen with Gabapentin. 07/01/2021 3. Intercostal  neuralgia: Unable to continue Flector Patch due to cost, Continue current medication regimen with Voltaren Gel, Robaxin and Neurontin. 07/01/2021.  4. Insomnia: Continue current medication regime with trazodone. 07/01/2021 5. Muscle Spasm: Continue current medication regimen with Robaxin. 07/01/2021 6. Polyarthralgia: Continue current medication regime. Continue to Monitor. 07/01/2021 7. Chronic Bilateral Thoracic Pain: Continue HEP and Continue Current medication regimen. Continue to monitor. 07/01/2021 8. Chronic Right Hip Pain/ Avascular Necrosis: Mr. Biven states he's awaiting a surgery date with Dr. Dion Saucier. S/P Hip Injection by Dr. Maurice Small with relief noted. 07/01/2021 9. Cervical Lymphadenopathy: Endocrinologist Following.  S/P Radioactive Iodine Thyroid Ablation on 05/13/2020,Continue to monitor. 07/01/2021   F/U in 1 month

## 2021-08-29 ENCOUNTER — Encounter: Payer: Medicare HMO | Admitting: Registered Nurse

## 2021-09-02 ENCOUNTER — Ambulatory Visit: Payer: Medicare HMO | Admitting: Registered Nurse

## 2021-09-05 ENCOUNTER — Encounter: Payer: Self-pay | Admitting: Registered Nurse

## 2021-09-05 ENCOUNTER — Other Ambulatory Visit: Payer: Self-pay

## 2021-09-05 ENCOUNTER — Encounter: Payer: Medicare HMO | Attending: Physical Medicine & Rehabilitation | Admitting: Registered Nurse

## 2021-09-05 VITALS — BP 154/81 | HR 84 | Temp 98.3°F | Ht 69.0 in

## 2021-09-05 DIAGNOSIS — Z5181 Encounter for therapeutic drug level monitoring: Secondary | ICD-10-CM | POA: Diagnosis present

## 2021-09-05 DIAGNOSIS — M5416 Radiculopathy, lumbar region: Secondary | ICD-10-CM | POA: Insufficient documentation

## 2021-09-05 DIAGNOSIS — Z79891 Long term (current) use of opiate analgesic: Secondary | ICD-10-CM | POA: Diagnosis present

## 2021-09-05 DIAGNOSIS — G894 Chronic pain syndrome: Secondary | ICD-10-CM | POA: Diagnosis present

## 2021-09-05 DIAGNOSIS — M25512 Pain in left shoulder: Secondary | ICD-10-CM | POA: Insufficient documentation

## 2021-09-05 DIAGNOSIS — M8700 Idiopathic aseptic necrosis of unspecified bone: Secondary | ICD-10-CM | POA: Diagnosis present

## 2021-09-05 DIAGNOSIS — G8929 Other chronic pain: Secondary | ICD-10-CM | POA: Insufficient documentation

## 2021-09-05 DIAGNOSIS — M2550 Pain in unspecified joint: Secondary | ICD-10-CM | POA: Diagnosis not present

## 2021-09-05 DIAGNOSIS — M519 Unspecified thoracic, thoracolumbar and lumbosacral intervertebral disc disorder: Secondary | ICD-10-CM | POA: Diagnosis not present

## 2021-09-05 DIAGNOSIS — M546 Pain in thoracic spine: Secondary | ICD-10-CM | POA: Diagnosis present

## 2021-09-05 MED ORDER — OXYCODONE HCL 10 MG PO TABS: 10.0000 mg | ORAL_TABLET | Freq: Every day | ORAL | 0 refills | Status: DC | PRN

## 2021-09-05 NOTE — Progress Notes (Signed)
Subjective:    Patient ID: Matthew Sloan, male    DOB: Nov 22, 1960, 61 y.o.   MRN: [id number]  HPI: Matthew Sloan is a 61 y.o. male who returns for follow up appointment for chronic pain and medication refill. He states his pain is located in his mid- lower back pain radiating into his right hip and bilateral lower extremities R>L.[name] He rates his pain 10. His current exercise regime is walking short distances in his home with crutches, [place] or his cane  and performing stretching exercises.  Matthew Sloan Morphine equivalent is 75.00 MME.   Last Oral Swab was Performed on 04/21/2021, it was consistent.     Pain Inventory Average Pain 10 Pain Right Now 10 My pain is constant, sharp, burning, dull, stabbing, tingling, and aching  In the last 24 hours, has pain interfered with the following? General activity 10 Relation with others 8 Enjoyment of life 8 What TIME of day is your pain at its worst? morning , daytime, evening, and night Sleep (in general) Poor  Pain is worse with: walking, bending, sitting, inactivity, and standing Pain improves with: therapy/exercise and medication Relief from Meds: 2   Family History  Problem Relation Age of Onset   Cancer Mother 41       breast cancer   Heart disease Father 59       CHF   Social History   Socioeconomic History   Marital status: Single    Spouse name: Not on file   Number of children: Not on file   Years of education: Not on file   Highest education level: Not on file  Occupational History   Not on file  Tobacco Use   Smoking status: Every Day    Packs/day: 1.00    Years: 30.00    Pack years: 30.00    Types: Cigarettes   Smokeless tobacco: Never   Tobacco comments:    working on it-down to 0.5 ppd  Vaping Use   Vaping Use: Never used  Substance and Sexual Activity   Alcohol use: No   Drug use: No   Sexual activity: Not on file  Other Topics Concern   Not on file  Social History Narrative   Not on file    Social Determinants of Health   Financial Resource Strain: Not on file  Food Insecurity: Not on file  Transportation Needs: Not on file  Physical Activity: Not on file  Stress: Not on file  Social Connections: Not on file   Past Surgical History:  Procedure Laterality Date   bilateral tubes placed in ears  04/12/2012   CARPAL TUNNEL RELEASE  12/15/2011   Procedure: CARPAL TUNNEL RELEASE;  Surgeon: [name], MD;  Location: [facility];  Service: Orthopedics;  Laterality: Left;   MULTIPLE TOOTH EXTRACTIONS     SHOULDER SURGERY  2012   lt   ULNAR TUNNEL RELEASE  12/15/2011   Procedure: CUBITAL TUNNEL RELEASE;  Surgeon: [name], MD;  Location: [facility];  Service: Orthopedics;  Laterality: Left;  left cubital tunnel release   Past Surgical History:  Procedure Laterality Date   bilateral tubes placed in ears  04/12/2012   CARPAL TUNNEL RELEASE  12/15/2011   Procedure: CARPAL TUNNEL RELEASE;  Surgeon: [name], MD;  Location: [facility];  Service: Orthopedics;  Laterality: Left;   MULTIPLE TOOTH EXTRACTIONS     SHOULDER SURGERY  2012   lt   ULNAR  TUNNEL RELEASE  12/15/2011   Procedure: CUBITAL TUNNEL RELEASE;  Surgeon: [name], MD;  Location: [facility];  Service: Orthopedics;  Laterality: Left;  left cubital tunnel release   Past Medical History:  Diagnosis Date   Allergy    Chronic pain due to trauma    Disturbance of skin sensation    Intercostal neuralgia    Lumbosacral neuritis    Myocardial infarction (HCC)    Rotator cuff (capsule) sprain    Snores    BP (!) 154/81    Pulse 84    Temp 98.3 F (36.8 C)    Ht 5\' 9"  (1.753 m)    SpO2 93%    BMI 37.36 kg/m   Opioid Risk Score:   Fall Risk Score:  `1  Depression screen PHQ 2/9  Depression screen [facility] 2/9 09/05/2021 07/01/2021 03/07/2021 08/16/2020 05/17/2020 02/12/2020 01/12/2020  Decreased Interest 0 0 0 0 0 0 0  Down, Depressed, Hopeless 0 0 0 0 0 - 0   PHQ - 2 Score 0 0 0 0 0 0 0  Altered sleeping - - - - - - -  Tired, decreased energy - - - - - - -  Feeling bad or failure about yourself  - - - - - - -  Trouble concentrating - - - - - - -  Moving slowly or fidgety/restless - - - - - - -  Suicidal thoughts - - - - - - -  PHQ-9 Score - - - - - - -     Review of Systems  Constitutional: Negative.   HENT: Negative.    Eyes: Negative.   Respiratory: Negative.    Cardiovascular: Negative.   Endocrine: Negative.   Genitourinary: Negative.   Musculoskeletal:  Positive for back pain and gait problem.  Skin: Negative.   Allergic/Immunologic: Negative.   Neurological:  Positive for weakness and numbness.  Hematological: Negative.   Psychiatric/Behavioral:  Positive for sleep disturbance.       Objective:   Physical Exam Vitals and nursing note reviewed.  Constitutional:      Appearance: Normal appearance.  Cardiovascular:     Rate and Rhythm: Normal rate and regular rhythm.     Pulses: Normal pulses.     Heart sounds: Normal heart sounds.  Pulmonary:     Effort: Pulmonary effort is normal.     Breath sounds: Normal breath sounds.  Musculoskeletal:     Cervical back: Normal range of motion and neck supple.     Comments: Normal Muscle Bulk and Muscle Testing Reveals:  Upper Extremities: Right: Full ROM and Muscle Strength 5/5 Left Upper Extremity : Decreased ROM 30 Degrees and Muscle Strength 5/5 Thoracic Paraspinal Tenderness: T-7-T-9 Lumbar Paraspinal Tenderness: L-3-L-5 Bilateral Greater Trochanter Tenderness: R>L Lower Extremities Right: Decreased ROM and Muscle Strength 4/5 Right Lower Extremity Flexion Produces Pain into his Lumbar and Right Hip Left Lower Extremity : Full ROM and Muscle Strength 5/5 Arrived in Scooter    Skin:    General: Skin is warm and dry.  Neurological:     Mental Status: He is alert and oriented to person, place, and time.  Psychiatric:        Mood and Affect: Mood normal.        Behavior:  Behavior normal.         Assessment & Plan:  1. 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 09/05/2021 Refilled: Oxycodone 10 mg one tablet 5 times a day  as needed for pain #150. We will continue the opioid monitoring program, this consists of regular clinic visits, examinations, urine drug screen, pill counts as well as use of [place] Controlled Substance Reporting system. A 12 month History has been reviewed on the [place] Controlled Substance Reporting System 09/05/2021.  2. Lumbar Radiculitis: Continue current medication regimen with Gabapentin. 09/05/2021 3. Intercostal neuralgia: Unable to continue Flector Patch due to cost, Continue current medication regimen with Voltaren Gel, Robaxin and Neurontin. 09/05/2021.  4. Insomnia: Continue current medication regime with trazodone. 09/05/2021 5. Muscle Spasm: Continue current medication regimen with Robaxin. 09/05/2021 6. Polyarthralgia: Continue current medication regime. Continue to Monitor. 09/05/2021 7. Chronic Bilateral Thoracic Pain: Continue HEP and Continue Current medication regimen. Continue to monitor. 09/05/2021 8. Chronic Right Hip Pain/ Avascular Necrosis: Matthew Sloan states he's awaiting a surgery date with Dr. Dion Saucier. S/P Hip Injection by Dr. Maurice Small with relief noted. 09/05/2021 9. Cervical Lymphadenopathy: Endocrinologist Following.  S/P Radioactive Iodine Thyroid Ablation on 05/13/2020,Continue to monitor. 09/05/2021   F/U in 2 months: Matthew Sloan having Transportation difficulties. He will be going for a second opinion at [facility] regarding his Hip Surgery, he states.

## 2021-10-28 ENCOUNTER — Encounter: Payer: Medicare HMO | Admitting: Registered Nurse

## 2021-11-05 ENCOUNTER — Encounter: Payer: Medicare HMO | Admitting: Registered Nurse

## 2021-11-12 ENCOUNTER — Encounter: Payer: Medicare HMO | Admitting: Registered Nurse

## 2021-11-27 ENCOUNTER — Encounter: Payer: Medicare HMO | Attending: Physical Medicine & Rehabilitation | Admitting: Registered Nurse

## 2021-11-27 ENCOUNTER — Encounter: Payer: Self-pay | Admitting: Registered Nurse

## 2021-11-27 VITALS — BP 127/75 | HR 77 | Ht 69.0 in

## 2021-11-27 DIAGNOSIS — M2550 Pain in unspecified joint: Secondary | ICD-10-CM | POA: Insufficient documentation

## 2021-11-27 DIAGNOSIS — G8929 Other chronic pain: Secondary | ICD-10-CM | POA: Diagnosis present

## 2021-11-27 DIAGNOSIS — M519 Unspecified thoracic, thoracolumbar and lumbosacral intervertebral disc disorder: Secondary | ICD-10-CM | POA: Insufficient documentation

## 2021-11-27 DIAGNOSIS — G894 Chronic pain syndrome: Secondary | ICD-10-CM | POA: Diagnosis present

## 2021-11-27 DIAGNOSIS — Z5181 Encounter for therapeutic drug level monitoring: Secondary | ICD-10-CM | POA: Diagnosis present

## 2021-11-27 DIAGNOSIS — M25512 Pain in left shoulder: Secondary | ICD-10-CM | POA: Diagnosis present

## 2021-11-27 DIAGNOSIS — M546 Pain in thoracic spine: Secondary | ICD-10-CM | POA: Diagnosis present

## 2021-11-27 DIAGNOSIS — Z79891 Long term (current) use of opiate analgesic: Secondary | ICD-10-CM | POA: Diagnosis present

## 2021-11-27 DIAGNOSIS — M5416 Radiculopathy, lumbar region: Secondary | ICD-10-CM | POA: Insufficient documentation

## 2021-11-27 DIAGNOSIS — M25511 Pain in right shoulder: Secondary | ICD-10-CM | POA: Insufficient documentation

## 2021-11-27 DIAGNOSIS — M8700 Idiopathic aseptic necrosis of unspecified bone: Secondary | ICD-10-CM | POA: Diagnosis present

## 2021-11-27 MED ORDER — OXYCODONE HCL 10 MG PO TABS: 10.0000 mg | ORAL_TABLET | Freq: Every day | ORAL | 0 refills | Status: DC | PRN

## 2021-11-27 NOTE — Progress Notes (Signed)
Subjective:    Patient ID: Matthew Sloan, male    DOB: Nov 26, 1960, 61 y.o.   MRN: [id number]  HPI: Matthew Sloan is a 61 y.o. male who returns for follow up appointment for chronic pain and medication refill. He states his pain is located in his bilateral shoulders L>R, mid- lower back pain radiating into his bilateral hips and bilateral lower extremities. He also reports at times he has right thoracic burning sensation, he will F/U with his PCP and GI.  He rates his pain 9. His current exercise regime is  performing stretching exercises.  Mr. Arnone Morphine equivalent is 75.00 MME.   Oral Swab was Performed today.       Pain Inventory Average Pain 10 Pain Right Now 9 My pain is constant, sharp, burning, dull, stabbing, tingling, and aching  In the last 24 hours, has pain interfered with the following? General activity 10 Relation with others 8 Enjoyment of life 8 What TIME of day is your pain at its worst? morning , daytime, evening, and night Sleep (in general) Poor  Pain is worse with: walking, bending, sitting, inactivity, and standing Pain improves with: medication Relief from Meds: 2  Family History  Problem Relation Age of Onset   Cancer Mother 5       breast cancer   Heart disease Father 69       CHF   Social History   Socioeconomic History   Marital status: Single    Spouse name: Not on file   Number of children: Not on file   Years of education: Not on file   Highest education level: Not on file  Occupational History   Not on file  Tobacco Use   Smoking status: Every Day    Packs/day: 1.00    Years: 30.00    Pack years: 30.00    Types: Cigarettes   Smokeless tobacco: Never   Tobacco comments:    working on it-down to 0.5 ppd  Vaping Use   Vaping Use: Never used  Substance and Sexual Activity   Alcohol use: No   Drug use: No   Sexual activity: Not on file  Other Topics Concern   Not on file  Social History Narrative   Not on file   Social  Determinants of Health   Financial Resource Strain: Not on file  Food Insecurity: Not on file  Transportation Needs: Not on file  Physical Activity: Not on file  Stress: Not on file  Social Connections: Not on file   Past Surgical History:  Procedure Laterality Date   bilateral tubes placed in ears  04/12/2012   CARPAL TUNNEL RELEASE  12/15/2011   Procedure: CARPAL TUNNEL RELEASE;  Surgeon: [name], MD;  Location: [facility];  Service: Orthopedics;  Laterality: Left;   MULTIPLE TOOTH EXTRACTIONS     SHOULDER SURGERY  2012   lt   ULNAR TUNNEL RELEASE  12/15/2011   Procedure: CUBITAL TUNNEL RELEASE;  Surgeon: [name], MD;  Location: [facility];  Service: Orthopedics;  Laterality: Left;  left cubital tunnel release   Past Surgical History:  Procedure Laterality Date   bilateral tubes placed in ears  04/12/2012   CARPAL TUNNEL RELEASE  12/15/2011   Procedure: CARPAL TUNNEL RELEASE;  Surgeon: [name], MD;  [facility];  Service: Orthopedics;  Laterality: Left;   MULTIPLE TOOTH EXTRACTIONS     SHOULDER SURGERY  2012   lt  ULNAR TUNNEL RELEASE  12/15/2011   Procedure: CUBITAL TUNNEL RELEASE;  Surgeon: Tami RibasKevin R Kuzma, MD;  Location: [facility];  Service: Orthopedics;  Laterality: Left;  left cubital tunnel release   Past Medical History:  Diagnosis Date   Allergy    Chronic pain due to trauma    Disturbance of skin sensation    Intercostal neuralgia    Lumbosacral neuritis    Myocardial infarction (HCC)    Rotator cuff (capsule) sprain    Snores    BP 127/75   Pulse 77   Ht 5\' 9"  (1.753 m)   SpO2 92%   BMI 37.36 kg/m   Opioid Risk Score:   Fall Risk Score:  `1  [facility]     11/27/2021    9:58 AM 09/05/2021    9:22 AM 07/01/2021    8:56 AM 03/07/2021   10:20 AM 08/16/2020    9:09 AM 05/17/2020    9:48 AM 02/12/2020    8:59 AM  Depression screen PHQ 2/9  Decreased Interest 0 0 0  0 0 0 0  Down, Depressed, Hopeless 0 0 0 0 0 0   PHQ - 2 Score 0 0 0 0 0 0 0     Review of Systems  Constitutional: Negative.   HENT: Negative.    Eyes: Negative.   Respiratory: Negative.    Cardiovascular: Negative.   Gastrointestinal: Negative.   Endocrine: Negative.   Genitourinary: Negative.   Musculoskeletal:  Positive for back pain and gait problem.  Skin: Negative.   Allergic/Immunologic: Negative.   Hematological: Negative.   Psychiatric/Behavioral:  Positive for sleep disturbance.       Objective:   Physical Exam Vitals and nursing note reviewed.  Constitutional:      Appearance: Normal appearance.  Cardiovascular:     Rate and Rhythm: Normal rate and regular rhythm.     Pulses: Normal pulses.     Heart sounds: Normal heart sounds.  Pulmonary:     Effort: Pulmonary effort is normal.     Breath sounds: Normal breath sounds.  Musculoskeletal:     Cervical back: Normal range of motion and neck supple.     Comments: Normal Muscle Bulk and Muscle Testing Reveals:  Upper Extremities: Right: Full ROM and Muscle Strength 5/5 Left Upper Extremity: Decreased ROM 45 Degrees and Muscle Strength 5/5 Thoracic and Lumbar Hypersensitivity Lower Extremities: Right: Decreased ROM and Muscle Strength 5/5 Right Lower Extremity Flexion Produces Pain into his Lower Back, Right  Hip and Right Lower Extremity Left Lower Extremity: Full ROM and Muscle Strength 5/5 Arrived in Scooter    Skin:    General: Skin is warm and dry.  Neurological:     Mental Status: He is alert and oriented to person, place, and time.  Psychiatric:        Mood and Affect: Mood normal.        Behavior: Behavior normal.         Assessment & Plan:  1. 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 11/27/2021 Refilled: Oxycodone 10 mg one tablet 5 times a day  as needed for pain #150. We  will continue the opioid monitoring program, this consists of regular clinic visits, examinations, urine drug screen, pill counts as well as use of [place] Cont[place] Substance Reporting system. A 12 month History has been [place] the [place] Cont[place] Substance  Reporting System 11/27/2021.  2. Lumbar Radiculitis: Continue current medication regimen with Gabapentin. 11/27/2021 3. Intercostal neuralgia: Unable to continue Flector Patch due to cost, Continue current medication regimen with Voltaren Gel, Robaxin and Neurontin. 11/27/2021.  4. Insomnia: Continue current medication regime with trazodone. 11/27/2021 5. Muscle Spasm: Continue current medication regimen with Robaxin. 11/27/2021 6. Polyarthralgia: Continue current medication regime. Continue to Monitor. 11/27/2021 7. Chronic Bilateral Thoracic Pain: Continue HEP and Continue Current medication regimen. Continue to monitor. 11/27/2021 8. Chronic Right Hip Pain/ Avascular Necrosis: Mr. Pressly states he's awaiting a surgery date with Dr. Dion Saucier. S/P Hip Injection by Dr. Maurice Small with relief noted. 11/27/2021 9. Cervical Lymphadenopathy: Endocrinologist Following.  S/P Radioactive Iodine Thyroid Ablation on 05/13/2020,Continue to monitor. 11/27/2021   F/U in 2 months: Mr. Dockendorf having Transportation difficulties. He will be going for a second opinion at [facility] regarding his Hip Surgery, he is awaiting an appointment he  states.

## 2021-11-28 ENCOUNTER — Telehealth: Payer: Self-pay

## 2021-11-28 MED ORDER — OXYCODONE HCL 10 MG PO TABS: 10.0000 mg | ORAL_TABLET | Freq: Every day | ORAL | 0 refills | Status: DC | PRN

## 2021-11-28 NOTE — Telephone Encounter (Signed)
PMP was Reviewed [facility] : Oxycodone on back order.  Prescription canceled, new prescription sent to [facility]. Call placed to Mr. Joswick regarding the above, she verbalizes understanding.

## 2021-11-28 NOTE — Telephone Encounter (Signed)
Please send a new script for Oxycodone 10 MG to [facility] on [address] in [place]. They have it in stock right now. Prior Rx to [facility] is cancelled.

## 2021-12-04 LAB — DRUG TOX MONITOR 1 W/CONF, ORAL FLD
Amphetamines: NEGATIVE ng/mL (ref <10)
Barbiturates: NEGATIVE ng/mL (ref <10)
Benzodiazepines: NEGATIVE ng/mL (ref <0.50)
Buprenorphine: NEGATIVE ng/mL (ref <0.10)
Cocaine: NEGATIVE ng/mL (ref <5.0)
Codeine: NEGATIVE ng/mL (ref <2.5)
Cotinine: >250 ng/mL — ABNORMAL HIGH (ref <5.0)
Dihydrocodeine: NEGATIVE ng/mL (ref <2.5)
Fentanyl: NEGATIVE ng/mL (ref <0.10)
Heroin Metabolite: NEGATIVE ng/mL (ref <1.0)
Hydrocodone: NEGATIVE ng/mL (ref <2.5)
Hydromorphone: NEGATIVE ng/mL (ref <2.5)
MARIJUANA: NEGATIVE ng/mL (ref <2.5)
MDMA: NEGATIVE ng/mL (ref <10)
Meprobamate: NEGATIVE ng/mL (ref <2.5)
Methadone: NEGATIVE ng/mL (ref <5.0)
Morphine: NEGATIVE ng/mL (ref <2.5)
Nicotine Metabolite: POSITIVE ng/mL — AB (ref <5.0)
Norhydrocodone: NEGATIVE ng/mL (ref <2.5)
Noroxycodone: 52.9 ng/mL — ABNORMAL HIGH (ref <2.5)
Opiates: POSITIVE ng/mL — AB (ref <2.5)
Oxycodone: >250 ng/mL — ABNORMAL HIGH (ref <2.5)
Oxymorphone: 3.5 ng/mL — ABNORMAL HIGH (ref <2.5)
Phencyclidine: NEGATIVE ng/mL (ref <10)
Tapentadol: NEGATIVE ng/mL (ref <5.0)
Tramadol: NEGATIVE ng/mL (ref <5.0)
Zolpidem: NEGATIVE ng/mL (ref <5.0)

## 2021-12-04 LAB — DRUG TOX ALC METAB W/CON, ORAL FLD: Alcohol Metabolite: NEGATIVE ng/mL (ref <25)

## 2021-12-05 ENCOUNTER — Telehealth: Payer: Self-pay | Admitting: *Deleted

## 2021-12-05 NOTE — Telephone Encounter (Signed)
Oral swab drug screen was consistent for prescribed medications.  ?

## 2022-01-19 NOTE — Addendum Note (Signed)
Addended by: Derrell Lolling on: 01/19/2022 11:43 AM   Modules accepted: Orders

## 2022-01-19 NOTE — Telephone Encounter (Signed)
Lab orders updated

## 2022-01-19 NOTE — Telephone Encounter (Signed)
Pt called and said he never got labs done, can you reorder labs so I can mail them to patient. He said he will call with an appt when he gets his order.

## 2022-01-23 ENCOUNTER — Encounter: Payer: Medicare HMO | Admitting: Registered Nurse

## 2022-01-27 ENCOUNTER — Ambulatory Visit: Payer: Medicare HMO | Admitting: Registered Nurse

## 2022-01-29 ENCOUNTER — Encounter: Payer: Medicare HMO | Attending: Physical Medicine & Rehabilitation | Admitting: Registered Nurse

## 2022-01-29 ENCOUNTER — Encounter: Payer: Self-pay | Admitting: Registered Nurse

## 2022-01-29 ENCOUNTER — Telehealth: Payer: Self-pay

## 2022-01-29 VITALS — BP 126/71 | HR 85 | Ht 69.0 in

## 2022-01-29 DIAGNOSIS — Z5181 Encounter for therapeutic drug level monitoring: Secondary | ICD-10-CM

## 2022-01-29 DIAGNOSIS — Z79891 Long term (current) use of opiate analgesic: Secondary | ICD-10-CM | POA: Diagnosis present

## 2022-01-29 DIAGNOSIS — M2550 Pain in unspecified joint: Secondary | ICD-10-CM | POA: Diagnosis present

## 2022-01-29 DIAGNOSIS — G8929 Other chronic pain: Secondary | ICD-10-CM | POA: Diagnosis present

## 2022-01-29 DIAGNOSIS — M546 Pain in thoracic spine: Secondary | ICD-10-CM

## 2022-01-29 DIAGNOSIS — M25512 Pain in left shoulder: Secondary | ICD-10-CM | POA: Diagnosis not present

## 2022-01-29 DIAGNOSIS — M8700 Idiopathic aseptic necrosis of unspecified bone: Secondary | ICD-10-CM | POA: Diagnosis present

## 2022-01-29 DIAGNOSIS — M519 Unspecified thoracic, thoracolumbar and lumbosacral intervertebral disc disorder: Secondary | ICD-10-CM

## 2022-01-29 DIAGNOSIS — G894 Chronic pain syndrome: Secondary | ICD-10-CM

## 2022-01-29 DIAGNOSIS — M5416 Radiculopathy, lumbar region: Secondary | ICD-10-CM

## 2022-01-29 MED ORDER — OXYCODONE HCL 10 MG PO TABS: 10.0000 mg | ORAL_TABLET | Freq: Every day | ORAL | 0 refills | Status: DC | PRN

## 2022-01-29 NOTE — Progress Notes (Signed)
Subjective:    Patient ID: Matthew Sloan, male    DOB: 06-10-1961, 61 y.o.   MRN: [id number]  HPI: Matthew Sloan is a 61 y.o. male who returns for follow up appointment for chronic pain and medication refill. He states his pain is located in his left shoulder, mid- lower back pain radiating into his right hip and right lower extremity. He also reports generalized pain. He rates his pain 9. His current exercise regime is walking in home to the bathroom with his cane.  Matthew Sloan Morphine equivalent is 75.00 MME.   Last Oral Swab was Performed on 11/27/2021, it was consistent.      Pain Inventory Average Pain 10 Pain Right Now 9 My pain is constant, sharp, burning, dull, stabbing, tingling, and aching  In the last 24 hours, has pain interfered with the following? General activity 10 Relation with others 8 Enjoyment of life 8 What TIME of day is your pain at its worst? morning , daytime, evening, and night Sleep (in general) Poor  Pain is worse with: walking, bending, sitting, inactivity, and standing Pain improves with: medication Relief from Meds: 2     Family History  Problem Relation Age of Onset   Cancer Mother 62       breast cancer   Heart disease Father 17       CHF   Social History   Socioeconomic History   Marital status: Single    Spouse name: Not on file   Number of children: Not on file   Years of education: Not on file   Highest education level: Not on file  Occupational History   Not on file  Tobacco Use   Smoking status: Every Day    Packs/day: 1.00    Years: 30.00    Total pack years: 30.00    Types: Cigarettes   Smokeless tobacco: Never   Tobacco comments:    working on it-down to 0.5 ppd  Vaping Use   Vaping Use: Never used  Substance and Sexual Activity   Alcohol use: No   Drug use: No   Sexual activity: Not on file  Other Topics Concern   Not on file  Social History Narrative   Not on file   Social Determinants of Health    Financial Resource Strain: Not on file  Food Insecurity: Not on file  Transportation Needs: Not on file  Physical Activity: Not on file  Stress: Not on file  Social Connections: Not on file   Past Surgical History:  Procedure Laterality Date   bilateral tubes placed in ears  04/12/2012   CARPAL TUNNEL RELEASE  12/15/2011   Procedure: CARPAL TUNNEL RELEASE;  Surgeon: [name], MD;  Location: [facility];  Service: Orthopedics;  Laterality: Left;   MULTIPLE TOOTH EXTRACTIONS     SHOULDER SURGERY  2012   lt   ULNAR TUNNEL RELEASE  12/15/2011   Procedure: CUBITAL TUNNEL RELEASE;  Surgeon: [name], MD;  Location: [facility];  Service: Orthopedics;  Laterality: Left;  left cubital tunnel release   Past Medical History:  Diagnosis Date   Allergy    Chronic pain due to trauma    Disturbance of skin sensation    Intercostal neuralgia    Lumbosacral neuritis    Myocardial infarction (HCC)    Rotator cuff (capsule) sprain    Snores    BP 126/71   Pulse 85   Ht 5\' 9"  (1.753  m)   SpO2 95%   BMI 37.36 kg/m   Opioid Risk Score:   Fall Risk Score:  `1  Depression screen [facility] 2/9     11/27/2021    9:58 AM 09/05/2021    9:22 AM 07/01/2021    8:56 AM 03/07/2021   10:20 AM 08/16/2020    9:09 AM 05/17/2020    9:48 AM 02/12/2020    8:59 AM  Depression screen PHQ 2/9  Decreased Interest 0 0 0 0 0 0 0  Down, Depressed, Hopeless 0 0 0 0 0 0   PHQ - 2 Score 0 0 0 0 0 0 0    Review of Systems  Musculoskeletal:  Positive for arthralgias, back pain and gait problem.       Pain all over the body  All other systems reviewed and are negative.      Objective:   Physical Exam Vitals and nursing note reviewed.  Constitutional:      Appearance: Normal appearance.  Cardiovascular:     Rate and Rhythm: Normal rate and regular rhythm.     Pulses: Normal pulses.     Heart sounds: Normal heart sounds.  Pulmonary:     Effort: Pulmonary effort is normal.      Breath sounds: Normal breath sounds.  Musculoskeletal:     Cervical back: Normal range of motion and neck supple.     Comments: Normal Muscle Bulk and Muscle Testing Reveals:  Upper Extremities: Decreased ROM Right 90 degrees and Left 45 Degrees and Muscle Strength 5/5  Thoracic Paraspinal Tenderness: T-3-T-7 Mainly Left Side Lumbar Hypersensitivity Bilateral Greater trochanter Tenderness Lower Extremities : Decreased ROM and Muscle Strength 4/5 Right Lower Extremity Flexion Produces Pain into his Lumbar and Right Hip He arrived in scooter    Skin:    General: Skin is warm and dry.  Neurological:     Mental Status: He is alert and oriented to person, place, and time.  Psychiatric:        Mood and Affect: Mood normal.        Behavior: Behavior normal.         Assessment & Plan:  1. 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 01/29/2022 Refilled: Oxycodone 10 mg one tablet 5 times a day  as needed for pain #150. We will continue the opioid monitoring program, this consists of regular clinic visits, examinations, urine drug screen, pill counts as well as use of [place] Controlled Substance Reporting system. A 12 month History has been reviewed on the [place] Controlled Substance Reporting System 01/29/2022.  2. Lumbar Radiculitis: Continue current medication regimen with Gabapentin. 01/29/2022 3. Intercostal neuralgia: Unable to continue Flector Patch due to cost, Continue current medication regimen with Voltaren Gel, Robaxin and Neurontin. 01/29/2022.  4. Insomnia: Continue current medication regime with trazodone. 01/29/2022 5. Muscle Spasm: Continue current medication regimen with Robaxin. 07/202023 6. Polyarthralgia: Continue current medication regime. Continue to Monitor. 01/29/2022 7. Chronic Bilateral Thoracic Pain: Continue HEP and Continue  Current medication regimen. Continue to monitor. 01/29/2022 8. Chronic Right Hip Pain/ Avascular Necrosis: Matthew Sloan states he's awaiting a surgery date with Dr. Dion Saucier. S/P Hip Injection by Dr. Maurice Small with relief noted. 01/29/2022 9. Cervical Lymphadenopathy: Endocrinologist Following.  S/P Radioactive Iodine Thyroid Ablation on 05/13/2020,Continue to monitor. 01/29/2022   F/U in 2 months: Matthew Sloan having Transportation difficulties. He will be going for a second opinion  at [facility] regarding his Hip Surgery, he is awaiting an appointment he  states.

## 2022-01-29 NOTE — Telephone Encounter (Signed)
Oxycodone 10 MG will be filled today for #50 due to pharmacy shortage.

## 2022-02-03 LAB — TSH: TSH: 0.088 u[IU]/mL — ABNORMAL LOW (ref 0.450–4.500)

## 2022-02-03 LAB — T4, FREE: Free T4: 0.99 ng/dL (ref 0.82–1.77)

## 2022-02-10 ENCOUNTER — Ambulatory Visit (INDEPENDENT_AMBULATORY_CARE_PROVIDER_SITE_OTHER): Payer: Medicare HMO | Admitting: "Endocrinology

## 2022-02-10 ENCOUNTER — Encounter: Payer: Self-pay | Admitting: "Endocrinology

## 2022-02-10 VITALS — BP 120/78 | HR 72 | Ht 69.0 in | Wt 261.4 lb

## 2022-02-10 DIAGNOSIS — E890 Postprocedural hypothyroidism: Secondary | ICD-10-CM

## 2022-02-10 DIAGNOSIS — Z91199 Patient's noncompliance with other medical treatment and regimen due to unspecified reason: Secondary | ICD-10-CM

## 2022-02-10 MED ORDER — LEVOTHYROXINE SODIUM 50 MCG PO TABS
50.0000 ug | ORAL_TABLET | Freq: Every day | ORAL | 1 refills | Status: DC
Start: 2022-02-10 — End: 2022-06-23

## 2022-02-10 NOTE — Progress Notes (Signed)
02/10/2022          Endocrinology follow-up note   Subjective:    Patient ID: Matthew Sloan, male    DOB: 1960-09-21, PCP Renee Ramus, MD.   Past Medical History:  Diagnosis Date   Allergy    Chronic pain due to trauma    Disturbance of skin sensation    Intercostal neuralgia    Lumbosacral neuritis    Myocardial infarction [facility])    Rotator cuff (capsule) sprain    Snores     Past Surgical History:  Procedure Laterality Date   bilateral tubes placed in ears  04/12/2012   CARPAL TUNNEL RELEASE  12/15/2011   Procedure: CARPAL TUNNEL RELEASE;  Surgeon: Tennis Must, MD;  Location: [place];  Service: Orthopedics;  Laterality: Left;   MULTIPLE TOOTH EXTRACTIONS     SHOULDER SURGERY  2012   lt   ULNAR TUNNEL RELEASE  12/15/2011   Procedure: CUBITAL TUNNEL RELEASE;  Surgeon: Tennis Must, MD;  Location: [place];  Service: Orthopedics;  Laterality: Left;  left cubital tunnel release    Social History   Socioeconomic History   Marital status: Single    Spouse name: Not on file   Number of children: Not on file   Years of education: Not on file   Highest education level: Not on file  Occupational History   Not on file  Tobacco Use   Smoking status: Every Day    Packs/day: 1.00    Years: 30.00    Total pack years: 30.00    Types: Cigarettes   Smokeless tobacco: Never   Tobacco comments:    working on it-down to 0.5 ppd  Vaping Use   Vaping Use: Never used  Substance and Sexual Activity   Alcohol use: No   Drug use: No   Sexual activity: Not on file  Other Topics Concern   Not on file  Social History Narrative   Not on file   Social Determinants of Health   Financial Resource Strain: Not on file  Food Insecurity: Not on file  Transportation Needs: Not on file  Physical Activity: Not on file  Stress: Not on file  Social Connections: Not on file    Family History  Problem Relation Age of Onset   Cancer Mother 56        breast cancer   Heart disease Father 58       CHF    Outpatient Encounter Medications as of 02/10/2022  Medication Sig   albuterol (VENTOLIN HFA) 108 (90 Base) MCG/ACT inhaler Inhale 2 puffs into the lungs as needed.   apixaban (ELIQUIS) 5 MG TABS tablet Take 5 mg by mouth 2 (two) times daily.   gabapentin (NEURONTIN) 800 MG tablet Take 1 tablet (800 mg total) by mouth 3 (three) times daily. (Patient not taking: Reported on 02/10/2022)   levothyroxine (SYNTHROID) 50 MCG tablet Take 1 tablet (50 mcg total) by mouth daily before breakfast.   methocarbamol (ROBAXIN) 500 MG tablet Take 1 tablet (500 mg total) by mouth 3 (three) times daily. (Patient not taking: Reported on 02/10/2022)   metoprolol succinate (TOPROL-XL) 50 MG 24 hr tablet Take 50 mg by mouth daily.   Oxycodone HCl 10 MG TABS Take 1 tablet (10 mg total) by mouth 5 (five) times daily as needed. Do Not Fill Before 02/25/2022   pravastatin (PRAVACHOL) 20 MG tablet Take 20 mg by mouth at bedtime.   [DISCONTINUED] BREO ELLIPTA  200-25 MCG/INH AEPB Inhale 1 puff into the lungs daily.   [DISCONTINUED] indomethacin (INDOCIN) 50 MG capsule Take 50 mg by mouth 3 (three) times daily.   [DISCONTINUED] levothyroxine (SYNTHROID) 75 MCG tablet Take 1 tablet (75 mcg total) by mouth daily before breakfast. (Patient not taking: Reported on 02/10/2022)   [DISCONTINUED] metoprolol tartrate (LOPRESSOR) 25 MG tablet Take 25 mg by mouth 2 (two) times daily.   [DISCONTINUED] pravastatin (PRAVACHOL) 40 MG tablet Take 1 tablet by mouth at bedtime.   [DISCONTINUED] promethazine (PHENERGAN) 12.5 MG tablet Take 12.5 mg by mouth 2 (two) times daily as needed.   [DISCONTINUED] sulfamethoxazole-trimethoprim (BACTRIM DS) 800-160 MG tablet Take 1 tablet by mouth 2 (two) times daily.   [DISCONTINUED] traZODone (DESYREL) 100 MG tablet TAKE 1 TABLET (100 MG TOTAL) BY MOUTH AT BEDTIME. (Patient taking differently: TAKE 1 TABLET (100 MG TOTAL) BY MOUTH AT BEDTIME. AS NEEDED)    No facility-administered encounter medications on file as of 02/10/2022.    ALLERGIES: Allergies  Allergen Reactions   Penicillins     VACCINATION STATUS:  There is no immunization history on file for this patient.   HPI  Matthew Sloan is 61 y.o. male who is presenting to follow-up after he was given radioactive iodine for treatment of hyperthyroidism from Graves' disease.    Referral requested by  Renee Ramus, MD. After treatment with RAI he developed hypothyroidism for which she was started on levothyroxine.  During his last visit in March he was kept on levothyroxine 75 mcg p.o. daily before breakfast.  Unfortunately, patient did not return for follow-up.  Admittedly, he has not been consistent taking his levothyroxine.  His most recent labs however show suppressed TSH and free T4 within normal limits.  Patient reports significant fatigue, sluggishness.  He has gained about 10 pounds of weight since last visit.  He denies palpitation, tremors,/nor heat/cold intolerance.  He denies dysphagia, shortness of breath.   He is known to have goiter with no discrete nodules. -He denies recent voice change.  He is a chronic active smoker.   he denies family history of thyroid dysfunction.  He denies  family hx of thyroid cancer.                            Review of systems As above.   Objective:    BP 120/78   Pulse 72   Ht 5' 9" (1.753 m)   Wt 261 lb 6.4 oz (118.6 kg)   BMI 38.60 kg/m   Wt Readings from Last 3 Encounters:  02/10/22 261 lb 6.4 oz (118.6 kg)  07/01/21 253 lb (114.8 kg)  04/21/21 237 lb (107.5 kg)        Neck exam: Thyroid generally smaller than last exam.  No gross palpable goiter.                  CMP     Component Value Date/Time   NA 139 01/03/2010 0545   K 4.3 01/03/2010 0545   CL 103 01/03/2010 0545   CO2 26 01/03/2010 0545   GLUCOSE 104 (H) 01/03/2010 0545   BUN 14 01/03/2010 0545   CREATININE 0.89 01/03/2010 0545   CALCIUM 8.7  01/03/2010 0545   PROT 6.7 10/10/2012 1031   ALBUMIN 4.3 10/10/2012 1031   AST 18 10/10/2012 1031   ALT 30 10/10/2012 1031   ALKPHOS 50 10/10/2012 1031   BILITOT 0.4 10/10/2012 1031  GFRNONAA >60 01/03/2010 0545   GFRAA  01/03/2010 0545    >60        The eGFR has been calculated using the MDRD equation. This calculation has not been validated in all clinical situations. eGFR's persistently <60 mL/min signify possible Chronic Kidney Disease.     CBC    Component Value Date/Time   WBC 8.6 01/07/2010 0945   RBC 3.81 (L) 01/07/2010 0945   HGB 15.7 12/15/2011 0809   HCT 33.9 (L) 01/07/2010 0945   PLT 514 (H) 01/07/2010 0945   MCV 89.1 01/07/2010 0945   MCH 29.6 01/07/2010 0945   MCHC 33.3 01/07/2010 0945   RDW 13.6 01/07/2010 0945   LYMPHSABS 2.3 01/07/2010 0945   MONOABS 0.6 01/07/2010 0945   EOSABS 0.7 01/07/2010 0945   BASOSABS 0.1 01/07/2010 0945   Recent Results (from the past 2160 hour(s))  Drug Tox Monitor 1 w/Conf, Oral Fld     Status: Abnormal   Collection Time: 11/27/21  9:51 AM  Result Value Ref Range   Amphetamines NEGATIVE <10 ng/mL   Barbiturates NEGATIVE <10 ng/mL   Benzodiazepines NEGATIVE <0.50 ng/mL   Buprenorphine NEGATIVE <0.10 ng/mL   Cocaine NEGATIVE <5.0 ng/mL   Fentanyl NEGATIVE <0.10 ng/mL   Heroin Metabolite NEGATIVE <1.0 ng/mL   MARIJUANA NEGATIVE <2.5 ng/mL   MDMA NEGATIVE <10 ng/mL   Meprobamate NEGATIVE <2.5 ng/mL   Methadone NEGATIVE <5.0 ng/mL   Nicotine Metabolite POSITIVE (A) <5.0 ng/mL   Cotinine >250.0 (H) <5.0 ng/mL    Comment: Cotinine is a metabolite of nicotine.   Opiates POSITIVE (A) <2.5 ng/mL   Codeine Negative <2.5 ng/mL   Dihydrocodeine Negative <2.5 ng/mL   Hydrocodone Negative <2.5 ng/mL   Hydromorphone Negative <2.5 ng/mL   Morphine Negative <2.5 ng/mL   Norhydrocodone Negative <2.5 ng/mL   Noroxycodone 52.9 (H) <2.5 ng/mL    Comment: . Noroxycodone is a metabolite of oxycodone.    Oxycodone >250.0 (H) <2.5  ng/mL   Oxymorphone 3.5 (H) <2.5 ng/mL    Comment: . Oxymorphone is a metabolite of oxycodone as well as a prescribed drug.    Phencyclidine NEGATIVE <10 ng/mL   Tapentadol NEGATIVE <5.0 ng/mL   Tramadol NEGATIVE <5.0 ng/mL   Zolpidem NEGATIVE <5.0 ng/mL    Comment: . For additional information, please refer to http://education.[facility].com/faq/FAQ186 (This link is being provided for informational/ educational purposes only.) . This drug testing is for medical treatment only. Analysis was performed as non-forensic testing and these results should be used only by healthcare providers to render diagnosis or treatment, or to monitor progress of medical conditions. . For assistance with interpreting these drug results, please contact a [employer] Toxicology Specialist: [phone] [place] [phone]), M-F, 8am-6pm EST. [name] These tests were developed and their analytical performance characteristics have been determined by [facility]. They have not been cleared or approved by the FDA. These assays have been validated pursuant to the CLIA regulations and are used for clinical purposes.   Drug Tox Alc Metab w/Con, Oral Fld     Status: None   Collection Time: 11/27/21  9:51 AM  Result Value Ref Range   Alcohol Metabolite NEGATIVE <25 ng/mL    Comment: . For additional information, please refer to http://education.[facility].com/faq/FAQ183 (This link is being provided for informational/ educational purposes only.) . This drug testing is for medical treatment only. Analysis was performed as non-forensic testing and these results should be used only by healthcare providers to render diagnosis or treatment, or to monitor  progress of medical conditions. . For assistance with interpreting these drug results, please contact a [employer] Toxicology Specialist: [phone] [place] [phone]), M-F, 8am-6pm EST. [name] These tests were developed and  their analytical performance characteristics have been determined by [facility]. They have not been cleared or approved by the FDA. These assays have been validated pursuant to the CLIA regulations and are used for clinical purposes.   TSH     Status: Abnormal   Collection Time: 02/02/22  4:09 PM  Result Value Ref Range   TSH 0.088 (L) 0.450 - 4.500 uIU/mL  T4, Free     Status: None   Collection Time: 02/02/22  4:09 PM  Result Value Ref Range   Free T4 0.99 0.82 - 1.77 ng/dL    April 24, 2020 thyroid uptake and scan: 24-hour uptake 56.9%.  Normal range 15 to 35%.  Previously, the 24-hour uptake was similarly elevated to 57.3%. Impression: Homogeneously increased uptake within an enlarged thyroid gland, is suggestive of Graves' disease.   RAI was given to treat hyperthyroidism in November 2021.  Assessment & Plan:   1.  RAI induced hypothyroidism  2.  Graves' disease-resolved See notes from prior visits.  He has not been consistent taking his levothyroxine.  His recent labs showed suppressed TSH, free T4 within normal range.  He will be restarted on levothyroxine 50 mcg p.o. daily before breakfast until next measurement.     - We discussed about the correct intake of his thyroid hormone, on empty stomach at fasting, with water, separated by at least 30 minutes from breakfast and other medications,  and separated by more than 4 hours from calcium, iron, multivitamins, acid reflux medications (PPIs). -Patient is made aware of the fact that thyroid hormone replacement is needed for life, dose to be adjusted by periodic monitoring of thyroid function tests.  There is apparent reduction in size of his thyroid as a result of radioactive iodine treatment.    - I advised him to maintain close follow up with Renee Ramus, MD for primary care needs.    I spent 21 minutes in the care of the patient today including review of labs from Thyroid Function, CMP, and other relevant  labs ; imaging/biopsy records (current and previous including abstractions from other facilities); face-to-face time discussing  his lab results and symptoms, medications doses, his options of short and long term treatment based on the latest standards of care / guidelines;   and documenting the encounter.  Matthew Sloan  participated in the discussions, expressed understanding, and voiced agreement with the above plans.  All questions were answered to his satisfaction. he is encouraged to contact clinic should he have any questions or concerns prior to his return visit.   Follow up plan: Return in about 3 months (around 05/13/2022) for F/U with Pre-visit Labs.   Thank you for involving me in the care of this pleasant patient, and I will continue to update you with his progress.  Glade Lloyd, MD [facility] Phone: [phone]  Fax: [phone]   02/10/2022, 6:20 PM  This note was partially dictated with voice recognition software. Similar sounding words can be transcribed inadequately or may not  be corrected upon review.

## 2022-02-11 ENCOUNTER — Telehealth: Payer: Self-pay

## 2022-02-11 MED ORDER — OXYCODONE HCL 10 MG PO TABS
10.0000 mg | ORAL_TABLET | Freq: Every day | ORAL | 0 refills | Status: DC | PRN
Start: 2022-02-11 — End: 2022-02-27

## 2022-02-11 NOTE — Telephone Encounter (Signed)
Patient called and stated when he got his refill of Oxycodone, they only had 50 tablets in stock. The pharmacy needs a new prescription for the remaining 100 tablets. He needs it sent to [facility] in [place], [place]

## 2022-02-11 NOTE — Telephone Encounter (Signed)
PMP was Reviewed.  Oxycodone e- scribed today. Matthew Sloan was called regarding the above, he verbalizes understanding.

## 2022-02-27 ENCOUNTER — Telehealth: Payer: Self-pay

## 2022-02-27 MED ORDER — OXYCODONE HCL 10 MG PO TABS
10.0000 mg | ORAL_TABLET | Freq: Every day | ORAL | 0 refills | Status: DC | PRN
Start: 2022-02-27 — End: 2022-04-21

## 2022-02-27 NOTE — Telephone Encounter (Signed)
Filled  Written  ID  Drug  QTY  Days  Prescriber  RX #  Dispenser  Refill  Daily Dose*  Pymt Type  PMP  02/12/2022 02/11/2022 1  Oxycodone Hcl (Ir) 10 Mg Tab 100.00 20 Eu Tho [id number] Mod (2615) 0/0 75.00 MME [facility]  Patient only received #100 for the the 150 on 02/12/2022. Please send in new script or additional dose to [facility].

## 2022-02-27 NOTE — Telephone Encounter (Signed)
PMP was Reviewed.  Oxycodone e-scribed to pharmacy.  Matthew Sloan is aware of the above via My-Chart message.

## 2022-04-01 ENCOUNTER — Encounter: Payer: Medicare HMO | Admitting: Registered Nurse

## 2022-04-10 ENCOUNTER — Encounter: Payer: Medicare HMO | Admitting: Registered Nurse

## 2022-04-17 ENCOUNTER — Encounter: Payer: Medicare HMO | Admitting: Registered Nurse

## 2022-04-21 ENCOUNTER — Encounter: Payer: Medicare HMO | Attending: Physical Medicine & Rehabilitation | Admitting: Registered Nurse

## 2022-04-21 ENCOUNTER — Encounter: Payer: Self-pay | Admitting: Registered Nurse

## 2022-04-21 VITALS — BP 138/83 | HR 81 | Ht 69.0 in | Wt 259.0 lb

## 2022-04-21 DIAGNOSIS — M5416 Radiculopathy, lumbar region: Secondary | ICD-10-CM

## 2022-04-21 DIAGNOSIS — G894 Chronic pain syndrome: Secondary | ICD-10-CM | POA: Diagnosis present

## 2022-04-21 DIAGNOSIS — M25512 Pain in left shoulder: Secondary | ICD-10-CM | POA: Diagnosis present

## 2022-04-21 DIAGNOSIS — Z5181 Encounter for therapeutic drug level monitoring: Secondary | ICD-10-CM

## 2022-04-21 DIAGNOSIS — M2550 Pain in unspecified joint: Secondary | ICD-10-CM

## 2022-04-21 DIAGNOSIS — G8929 Other chronic pain: Secondary | ICD-10-CM | POA: Diagnosis present

## 2022-04-21 DIAGNOSIS — M519 Unspecified thoracic, thoracolumbar and lumbosacral intervertebral disc disorder: Secondary | ICD-10-CM

## 2022-04-21 DIAGNOSIS — M8700 Idiopathic aseptic necrosis of unspecified bone: Secondary | ICD-10-CM | POA: Diagnosis present

## 2022-04-21 DIAGNOSIS — M546 Pain in thoracic spine: Secondary | ICD-10-CM

## 2022-04-21 DIAGNOSIS — M25522 Pain in left elbow: Secondary | ICD-10-CM | POA: Diagnosis present

## 2022-04-21 DIAGNOSIS — Z79891 Long term (current) use of opiate analgesic: Secondary | ICD-10-CM

## 2022-04-21 MED ORDER — OXYCODONE HCL 10 MG PO TABS: 10.0000 mg | ORAL_TABLET | Freq: Every day | ORAL | 0 refills | Status: DC | PRN

## 2022-04-21 NOTE — Progress Notes (Signed)
Subjective:    Patient ID: Matthew Sloan, male    DOB: 08-27-1960, 61 y.o.   MRN: [id number]  HPI: Matthew Sloan is a 61 y.o. male who returns for follow up appointment for chronic pain and medication refill. He states his pain is located in his left shoulder, left elbow since his fall a month ago and Ortho following he reports. He also states he has  mid-lower back pain radiating into his right hip. He rates his pain 10. His current exercise regime is walking and performing stretching exercises.  Matthew Sloan states he had a fall in his home a month ago, lost his balance and landed on his left side. He was evaluated in Urgent Care and he also states he has been following with Ortho, he is scheduled for surgery on 04/29/2022, he reports He was educated onj falls prevention, he verbalizes understanding.   Matthew Sloan Morphine equivalent is 75.00 MME.   Oral Swab was Performed today.     Pain Inventory Average Pain 10 Pain Right Now 10 My pain is constant, sharp, burning, stabbing, tingling, and aching  In the last 24 hours, has pain interfered with the following? General activity 10 Relation with others 8 Enjoyment of life 8 What TIME of day is your pain at its worst? morning , daytime, evening, and night Sleep (in general) Poor  Pain is worse with: walking, bending, sitting, inactivity, and standing Pain improves with: medication Relief from Meds: 2  Family History  Problem Relation Age of Onset   Cancer Mother 84       breast cancer   Heart disease Father 20       CHF   Social History   Socioeconomic History   Marital status: Single    Spouse name: Not on file   Number of children: Not on file   Years of education: Not on file   Highest education level: Not on file  Occupational History   Not on file  Tobacco Use   Smoking status: Every Day    Packs/day: 1.00    Years: 30.00    Total pack years: 30.00    Types: Cigarettes   Smokeless tobacco: Never   Tobacco  comments:    working on it-down to 0.5 ppd  Vaping Use   Vaping Use: Never used  Substance and Sexual Activity   Alcohol use: No   Drug use: No   Sexual activity: Not on file  Other Topics Concern   Not on file  Social History Narrative   Not on file   Social Determinants of Health   Financial Resource Strain: Not on file  Food Insecurity: Not on file  Transportation Needs: Not on file  Physical Activity: Not on file  Stress: Not on file  Social Connections: Not on file   Past Surgical History:  Procedure Laterality Date   bilateral tubes placed in ears  04/12/2012   CARPAL TUNNEL RELEASE  12/15/2011   Procedure: CARPAL TUNNEL RELEASE;  Surgeon: Tennis Must, MD;  Location: [place];  Service: Orthopedics;  Laterality: Left;   MULTIPLE TOOTH EXTRACTIONS     SHOULDER SURGERY  2012   lt   ULNAR TUNNEL RELEASE  12/15/2011   Procedure: CUBITAL TUNNEL RELEASE;  Surgeon: Tennis Must, MD;  Location: [place];  Service: Orthopedics;  Laterality: Left;  left cubital tunnel release   Past Surgical History:  Procedure Laterality Date   bilateral tubes placed in  ears  04/12/2012   CARPAL TUNNEL RELEASE  12/15/2011   Procedure: CARPAL TUNNEL RELEASE;  Surgeon: [name], MD;  Location: [facility];  Service: Orthopedics;  Laterality: Left;   MULTIPLE TOOTH EXTRACTIONS     SHOULDER SURGERY  2012   lt   ULNAR TUNNEL RELEASE  12/15/2011   Procedure: CUBITAL TUNNEL RELEASE;  Surgeon: [name], MD;  Location: [facility];  Service: Orthopedics;  Laterality: Left;  left cubital tunnel release   Past Medical History:  Diagnosis Date   Allergy    Chronic pain due to trauma    Disturbance of skin sensation    Intercostal neuralgia    Lumbosacral neuritis    Myocardial infarction (HCC)    Rotator cuff (capsule) sprain    Snores    BP 138/83   Pulse 81   Ht 5\' 9"  (1.753 m)   Wt 259 lb (117.5 kg)   SpO2 93%   BMI 38.25  kg/m   Opioid Risk Score:   Fall Risk Score:  `1  Depression screen [facility] 2/9     01/29/2022   10:08 AM 11/27/2021    9:58 AM 09/05/2021    9:22 AM 07/01/2021    8:56 AM 03/07/2021   10:20 AM 08/16/2020    9:09 AM 05/17/2020    9:48 AM  Depression screen PHQ 2/9  Decreased Interest 0 0 0 0 0 0 0  Down, Depressed, Hopeless 0 0 0 0 0 0 0  PHQ - 2 Score 0 0 0 0 0 0 0     Review of Systems  Musculoskeletal:  Positive for back pain.       Bilateral leg pain Right hip pain Elbow pain Bilateral arm pain  All other systems reviewed and are negative.     Objective:   Physical Exam Vitals and nursing note reviewed.  Constitutional:      Appearance: Normal appearance.  Cardiovascular:     Rate and Rhythm: Normal rate and regular rhythm.     Pulses: Normal pulses.     Heart sounds: Normal heart sounds.  Pulmonary:     Effort: Pulmonary effort is normal.     Breath sounds: Normal breath sounds.  Musculoskeletal:     Cervical back: Normal range of motion and neck supple.     Comments: Normal Muscle Bulk and Muscle Testing Reveals:  Upper Extremities: Right: Full ROM and Muscle Strength  5/5 Left upper Extremity: Decreased ROM 30 Degrees and Muscle Strength 5/5 Thoracic Paraspinal Tenderness: T-10-T-12 Lumbar Hypersensitivity Right Greater Trochanter Tenderness Lower Extremities: Decreased ROM and Muscle Strength 4/5 Bilateral Lower Extremities Flexion Produces Pain into his Bilateral Lower Extremities and  Right Hip Arrived in Scooter   Skin:    General: Skin is warm and dry.  Neurological:     Mental Status: He is alert and oriented to person, place, and time.  Psychiatric:        Mood and Affect: Mood normal.        Behavior: Behavior normal.         Assessment & Plan:  1. 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 04/21/2022 Refilled: Oxycodone 10 mg  one tablet 5 times a day  as needed for pain #150.Second script sent for the following month to accommodate scheduled appointment.  We will continue the opioid monitoring program, this consists of regular clinic visits,  examinations, urine drug screen, pill counts as well as use of [place] Controlled Substance Reporting system. A 12 month History has been reviewed on the [place] Controlled Substance Reporting System 04/21/2022.  2. Lumbar Radiculitis: Continue current medication regimen with Gabapentin. 04/21/2022 3. Intercostal neuralgia: Unable to continue Flector Patch due to cost, Continue current medication regimen with Voltaren Gel, Robaxin and Neurontin. 04/21/2022.  4. Insomnia: Continue current medication regime with trazodone. 04/21/2022 5. Muscle Spasm: Continue current medication regimen with Robaxin. 10/102023 6. Polyarthralgia: Continue current medication regime. Continue to Monitor. 04/21/2022 7. Chronic Bilateral Thoracic Pain: Continue HEP and Continue Current medication regimen. Continue to monitor. 04/21/2022 8. Chronic Right Hip Pain/ Avascular Necrosis: Matthew Sloan states he's awaiting a surgery date with Dr. Dion Saucier. S/P Hip Injection by Dr. Maurice Small with relief noted. 04/21/2022 9. Cervical Lymphadenopathy: Endocrinologist Following.  S/P Radioactive Iodine Thyroid Ablation on 05/13/2020,Continue to monitor. 04/21/2022 10. Left Elbow Pain: He is scheduled for surgery on 04/29/2022, Ortho following.   F/U in 1 month:

## 2022-04-24 LAB — DRUG TOX MONITOR 1 W/CONF, ORAL FLD
Amphetamines: NEGATIVE ng/mL (ref <10)
Barbiturates: NEGATIVE ng/mL (ref <10)
Benzodiazepines: NEGATIVE ng/mL (ref <0.50)
Buprenorphine: NEGATIVE ng/mL (ref <0.10)
Cocaine: NEGATIVE ng/mL (ref <5.0)
Codeine: NEGATIVE ng/mL (ref <2.5)
Cotinine: 217.4 ng/mL — ABNORMAL HIGH (ref <5.0)
Dihydrocodeine: NEGATIVE ng/mL (ref <2.5)
Fentanyl: NEGATIVE ng/mL (ref <0.10)
Heroin Metabolite: NEGATIVE ng/mL (ref <1.0)
Hydrocodone: NEGATIVE ng/mL (ref <2.5)
Hydromorphone: NEGATIVE ng/mL (ref <2.5)
MARIJUANA: NEGATIVE ng/mL (ref <2.5)
MDMA: NEGATIVE ng/mL (ref <10)
Meprobamate: NEGATIVE ng/mL (ref <2.5)
Methadone: NEGATIVE ng/mL (ref <5.0)
Morphine: NEGATIVE ng/mL (ref <2.5)
Nicotine Metabolite: POSITIVE ng/mL — AB (ref <5.0)
Norhydrocodone: NEGATIVE ng/mL (ref <2.5)
Noroxycodone: 28.2 ng/mL — ABNORMAL HIGH (ref <2.5)
Opiates: POSITIVE ng/mL — AB (ref <2.5)
Oxycodone: >250 ng/mL — ABNORMAL HIGH (ref <2.5)
Oxymorphone: 3.4 ng/mL — ABNORMAL HIGH (ref <2.5)
Phencyclidine: NEGATIVE ng/mL (ref <10)
Tapentadol: NEGATIVE ng/mL (ref <5.0)
Tramadol: NEGATIVE ng/mL (ref <5.0)
Zolpidem: NEGATIVE ng/mL (ref <5.0)

## 2022-04-24 LAB — DRUG TOX ALC METAB W/CON, ORAL FLD: Alcohol Metabolite: NEGATIVE ng/mL (ref <25)

## 2022-05-04 ENCOUNTER — Telehealth: Payer: Self-pay | Admitting: *Deleted

## 2022-05-04 NOTE — Telephone Encounter (Signed)
Oral swab drug screen was consistent for prescribed medications.  ?

## 2022-05-13 ENCOUNTER — Ambulatory Visit: Payer: Medicare HMO | Admitting: "Endocrinology

## 2022-05-28 ENCOUNTER — Telehealth: Payer: Self-pay | Admitting: Registered Nurse

## 2022-05-28 NOTE — Telephone Encounter (Signed)
Call placed to Mr. Maciolek regarding his surgery date with Dr. Lauretta Grill at [facility].  He states he doesn't have a surgery date at this time, he thinks December or January. The above will be discussed with Dr Wynn Banker.

## 2022-06-10 ENCOUNTER — Telehealth: Payer: Self-pay

## 2022-06-10 NOTE — Telephone Encounter (Signed)
Patient called stating he has a transportation issue and can't make appt tomorrow. Please advice.

## 2022-06-11 ENCOUNTER — Telehealth: Payer: Self-pay | Admitting: Registered Nurse

## 2022-06-11 ENCOUNTER — Encounter: Payer: Medicare HMO | Admitting: Registered Nurse

## 2022-06-11 MED ORDER — OXYCODONE HCL 10 MG PO TABS: 10.0000 mg | ORAL_TABLET | Freq: Every day | ORAL | 0 refills | Status: DC | PRN

## 2022-06-11 NOTE — Telephone Encounter (Signed)
Left patient a message to call and reschedule and let us know how many pills he has left.

## 2022-06-11 NOTE — Telephone Encounter (Signed)
PMP was Reviewed.  Oxycodone e-scribed to pharmacy.  

## 2022-06-11 NOTE — Telephone Encounter (Signed)
Pt called to notify you he took last pill this morning. Please send in refills.

## 2022-06-12 ENCOUNTER — Ambulatory Visit: Payer: Medicare HMO | Admitting: Registered Nurse

## 2022-06-23 ENCOUNTER — Telehealth: Payer: Self-pay | Admitting: "Endocrinology

## 2022-06-23 ENCOUNTER — Other Ambulatory Visit: Payer: Self-pay | Admitting: "Endocrinology

## 2022-06-23 MED ORDER — LEVOTHYROXINE SODIUM 50 MCG PO TABS: 50.0000 ug | ORAL_TABLET | Freq: Every day | ORAL | 0 refills | Status: DC

## 2022-06-23 NOTE — Telephone Encounter (Signed)
Pt has not had his levothyroxine in over a week and is going to do his labs next week so that he can see you on 12/28. Please Advise about the medication

## 2022-07-08 ENCOUNTER — Encounter: Payer: Medicare HMO | Admitting: Registered Nurse

## 2022-07-09 ENCOUNTER — Ambulatory Visit: Payer: Medicare HMO | Admitting: "Endocrinology

## 2022-07-28 ENCOUNTER — Encounter: Payer: Medicare HMO | Admitting: Registered Nurse

## 2022-07-31 ENCOUNTER — Encounter: Payer: Medicare HMO | Attending: Physical Medicine & Rehabilitation | Admitting: Registered Nurse

## 2022-07-31 ENCOUNTER — Encounter: Payer: Self-pay | Admitting: Registered Nurse

## 2022-07-31 VITALS — BP 122/82 | HR 67 | Ht 69.0 in | Wt 254.0 lb

## 2022-07-31 DIAGNOSIS — M25512 Pain in left shoulder: Secondary | ICD-10-CM

## 2022-07-31 DIAGNOSIS — M546 Pain in thoracic spine: Secondary | ICD-10-CM

## 2022-07-31 DIAGNOSIS — M8700 Idiopathic aseptic necrosis of unspecified bone: Secondary | ICD-10-CM

## 2022-07-31 DIAGNOSIS — G894 Chronic pain syndrome: Secondary | ICD-10-CM

## 2022-07-31 DIAGNOSIS — G8929 Other chronic pain: Secondary | ICD-10-CM | POA: Diagnosis present

## 2022-07-31 DIAGNOSIS — M2550 Pain in unspecified joint: Secondary | ICD-10-CM | POA: Diagnosis present

## 2022-07-31 DIAGNOSIS — M519 Unspecified thoracic, thoracolumbar and lumbosacral intervertebral disc disorder: Secondary | ICD-10-CM | POA: Diagnosis present

## 2022-07-31 DIAGNOSIS — Z79891 Long term (current) use of opiate analgesic: Secondary | ICD-10-CM

## 2022-07-31 DIAGNOSIS — Z5181 Encounter for therapeutic drug level monitoring: Secondary | ICD-10-CM

## 2022-07-31 DIAGNOSIS — M5416 Radiculopathy, lumbar region: Secondary | ICD-10-CM

## 2022-07-31 DIAGNOSIS — G4709 Other insomnia: Secondary | ICD-10-CM | POA: Diagnosis present

## 2022-07-31 LAB — TSH: TSH: 0.014 u[IU]/mL — ABNORMAL LOW (ref 0.450–4.500)

## 2022-07-31 LAB — T4, FREE: Free T4: 1.46 ng/dL (ref 0.82–1.77)

## 2022-07-31 MED ORDER — TRAZODONE HCL 50 MG PO TABS: 50.0000 mg | ORAL_TABLET | Freq: Every day | ORAL | 2 refills | Status: DC

## 2022-07-31 MED ORDER — OXYCODONE HCL 10 MG PO TABS: 10.0000 mg | ORAL_TABLET | Freq: Every day | ORAL | 0 refills | Status: DC | PRN

## 2022-07-31 NOTE — Progress Notes (Signed)
Subjective:    Patient ID: Matthew Sloan, male    DOB: December 19, 1960, 62 y.o.   MRN: [id number]  HPI: Matthew Sloan is a 62 y.o. male who returns for follow up appointment for chronic pain and medication refill. He states his pain is located in his lower back radiating into his right hip and bilateral lower extremities with achiness . Matthew Sloan also reports he has generalized pain all over. He rates his pain 9. His current exercise regime is walking in his home, to the rest room only, he was instructed by his orthopedist he reports.   Matthew Sloan reports he is experiencing insomnia and would like to resume his Trazodone. Trazodone ordered today.  Matthew Sloan he should be having his Hip surgery in February, he will send My-Chart with update, he verbalizes understanding.   Matthew Sloan reports he was hospitalized in [place] about three weeks ago with pneumonia.   Last Oral Swab was Performed on 04/21/2022, it was consistent.    Pain Inventory Average Pain 10 Pain Right Now 9 My pain is sharp, burning, dull, stabbing, tingling, and aching  In the last 24 hours, has pain interfered with the following? General activity 10 Relation with others 8 Enjoyment of life 8 What TIME of day is your pain at its worst? morning , daytime, evening, and night Sleep (in general) Poor  Pain is worse with: walking, bending, sitting, inactivity, and standing Pain improves with: medication Relief from Meds: 2  Family History  Problem Relation Age of Onset   Cancer Mother 40       breast cancer   Heart disease Father 23       CHF   Social History   Socioeconomic History   Marital status: Single    Spouse name: Not on file   Number of children: Not on file   Years of education: Not on file   Highest education level: Not on file  Occupational History   Not on file  Tobacco Use   Smoking status: Every Day    Packs/day: 1.00    Years: 30.00    Total pack years: 30.00    Types: Cigarettes    Smokeless tobacco: Never   Tobacco comments:    working on it-down to 0.5 ppd  Vaping Use   Vaping Use: Never used  Substance and Sexual Activity   Alcohol use: No   Drug use: No   Sexual activity: Not on file  Other Topics Concern   Not on file  Social History Narrative   Not on file   Social Determinants of Health   Financial Resource Strain: Not on file  Food Insecurity: Not on file  Transportation Needs: Not on file  Physical Activity: Not on file  Stress: Not on file  Social Connections: Not on file   Past Surgical History:  Procedure Laterality Date   bilateral tubes placed in ears  04/12/2012   CARPAL TUNNEL RELEASE  12/15/2011   Procedure: CARPAL TUNNEL RELEASE;  Surgeon: Tennis Must, MD;  Location: [place];  Service: Orthopedics;  Laterality: Left;   MULTIPLE TOOTH EXTRACTIONS     SHOULDER SURGERY  2012   lt   ULNAR TUNNEL RELEASE  12/15/2011   Procedure: CUBITAL TUNNEL RELEASE;  Surgeon: Tennis Must, MD;  Location: [place];  Service: Orthopedics;  Laterality: Left;  left cubital tunnel release   Past Surgical History:  Procedure Laterality Date   bilateral tubes  placed in ears  04/12/2012   CARPAL TUNNEL RELEASE  12/15/2011   Procedure: CARPAL TUNNEL RELEASE;  Surgeon: [name], MD;  Location: [facility];  Service: Orthopedics;  Laterality: Left;   MULTIPLE TOOTH EXTRACTIONS     SHOULDER SURGERY  2012   lt   ULNAR TUNNEL RELEASE  12/15/2011   Procedure: CUBITAL TUNNEL RELEASE;  Surgeon: [name], MD;  Location: [facility];  Service: Orthopedics;  Laterality: Left;  left cubital tunnel release   Past Medical History:  Diagnosis Date   Allergy    Chronic pain due to trauma    Disturbance of skin sensation    Intercostal neuralgia    Lumbosacral neuritis    Myocardial infarction (HCC)    Rotator cuff (capsule) sprain    Snores    There were no vitals taken for this visit.  Opioid  Risk Score:   Fall Risk Score:  `1  Depression screen [facility] 2/9     01/29/2022   10:08 AM 11/27/2021    9:58 AM 09/05/2021    9:22 AM 07/01/2021    8:56 AM 03/07/2021   10:20 AM 08/16/2020    9:09 AM 05/17/2020    9:48 AM  Depression screen PHQ 2/9  Decreased Interest 0 0 0 0 0 0 0  Down, Depressed, Hopeless 0 0 0 0 0 0 0  PHQ - 2 Score 0 0 0 0 0 0 0      Review of Systems  Musculoskeletal:  Positive for back pain and gait problem.  All other systems reviewed and are negative.     Objective:   Physical Exam Vitals and nursing note reviewed.  Constitutional:      Appearance: Normal appearance.  Cardiovascular:     Rate and Rhythm: Normal rate and regular rhythm.     Pulses: Normal pulses.     Heart sounds: Normal heart sounds.  Pulmonary:     Effort: Pulmonary effort is normal.     Breath sounds: Normal breath sounds.  Musculoskeletal:     Cervical back: Normal range of motion and neck supple.     Comments: Normal Muscle Bulk and Muscle Testing Reveals:  Upper Extremities:Right: Full  ROM and Muscle Strength 5/5 Left Upper Extremity: Decreased ROM  45 Degrees and Muscle Strength 5/5 Thoracic Paraspinal Tenderness: T-3- T-9  Lumbar Hypersensitivity Right Greater Trochanter Tenderness Lower Extremities: Right: Decreased ROM and Muscle Strength 3/5 Right Lower Extremity Flexion Produces Pain into his Lumbar, Right Hip and Lower Extremity Left Lower Extremity: Full ROM and Muscle Strength 5/5 Arrived in scooter    Skin:    General: Skin is warm and dry.  Neurological:     Mental Status: He is alert and oriented to person, place, and time.  Psychiatric:        Mood and Affect: Mood normal.        Behavior: Behavior normal.         Assessment & Plan:  1. 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 07/31/2022 Refilled: Oxycodone 10 mg one tablet 5 times  a day  as needed for pain #150.Second script sent for the following month to accommodate scheduled appointment.  We will continue the opioid monitoring program, this consists of regular clinic visits, examinations, urine drug screen, pill counts as well as use of [place] Controlled Substance Reporting system. A 12 month History  has been reviewed on the [place] 07/31/2022.  2. Lumbar Radiculitis: Continue current medication regimen with Gabapentin. 07/31/2022 3. Intercostal neuralgia: Unable to continue Flector Patch due to cost, Continue current medication regimen with Voltaren Gel, Robaxin and Neurontin. 07/31/2022.  4. Insomnia: RX:  trazodone. 07/31/2022 5. Muscle Spasm: Continue current medication regimen with Robaxin. 01/192024 6. Polyarthralgia: Continue current medication regime. Continue to Monitor. 07/31/2022 7. Chronic Bilateral Thoracic Pain: Continue HEP and Continue Current medication regimen. Continue to monitor. 07/31/2022 8. Chronic Right Hip Pain/ Avascular Necrosis: Matthew Sloan states he's awaiting a surgery date with Dr.  Cleda Mccreedy  . 07/31/2022 9. Cervical Lymphadenopathy: Endocrinologist Following. 07/31/2022 S/P Radioactive Iodine Thyroid Ablation on 05/13/2020,Continue to monitor. 07/31/2022 10. Left Elbow Pain: No complaints today.  Ortho following. Continue to monitor.    F/U in 2 months:

## 2022-08-03 ENCOUNTER — Ambulatory Visit (INDEPENDENT_AMBULATORY_CARE_PROVIDER_SITE_OTHER): Payer: Medicare HMO | Admitting: "Endocrinology

## 2022-08-03 ENCOUNTER — Encounter: Payer: Self-pay | Admitting: "Endocrinology

## 2022-08-03 VITALS — BP 136/88 | HR 56 | Ht 69.0 in

## 2022-08-03 DIAGNOSIS — E890 Postprocedural hypothyroidism: Secondary | ICD-10-CM | POA: Diagnosis not present

## 2022-08-03 DIAGNOSIS — E119 Type 2 diabetes mellitus without complications: Secondary | ICD-10-CM | POA: Diagnosis not present

## 2022-08-03 MED ORDER — LEVOTHYROXINE SODIUM 25 MCG PO TABS: 25.0000 ug | ORAL_TABLET | Freq: Every day | ORAL | 1 refills | Status: DC

## 2022-08-03 NOTE — Patient Instructions (Signed)
                                     Advice for Weight Management  -For most of us the best way to lose weight is by diet management. Generally speaking, diet management means consuming less calories intentionally which over time brings about progressive weight loss.  This can be achieved more effectively by avoiding ultra processed carbohydrates, processed meats, unhealthy fats.    It is critically important to know your numbers: how much calorie you are consuming and how much calorie you need. More importantly, our carbohydrates sources should be unprocessed naturally occurring  complex starch food items.  It is always important to balance nutrition also by  appropriate intake of proteins (mainly plant-based), healthy fats/oils, plenty of fruits and vegetables.   -The American College of Lifestyle Medicine (ACL M) recommends nutrition derived mostly from Whole Food, Plant Predominant Sources example an apple instead of applesauce or apple pie. Eat Plenty of vegetables, Mushrooms, fruits, Legumes, Whole Grains, Nuts, seeds in lieu of processed meats, processed snacks/pastries red meat, poultry, eggs.  Use only water or unsweetened tea for hydration.  The College also recommends the need to stay away from risky substances including alcohol, smoking; obtaining 7-9 hours of restorative sleep, at least 150 minutes of moderate intensity exercise weekly, importance of healthy social connections, and being mindful of stress and seek help when it is overwhelming.    -Sticking to a routine mealtime to eat 3 meals a day and avoiding unnecessary snacks is shown to have a big role in weight control. Under normal circumstances, the only time we burn stored energy is when we are hungry, so allow  some hunger to take place- hunger means no food between appropriate meal times, only water.  It is not advisable to starve.   -It is better to avoid simple carbohydrates including:  Cakes, Sweet Desserts, Ice Cream, Soda (diet and regular), Sweet Tea, Candies, Chips, Cookies, Store Bought Juices, Alcohol in Excess of  1-2 drinks a day, Lemonade,  Artificial Sweeteners, Doughnuts, Coffee Creamers, "Sugar-free" Products, etc, etc.  This is not a complete list.....    -Consulting with certified diabetes educators is proven to provide you with the most accurate and current information on diet.  Also, you may be  interested in discussing diet options/exchanges , we can schedule a visit with Matthew Sloan, RDN, CDE for individualized nutrition education.  -Exercise: If you are able: 30 -60 minutes a day ,4 days a week, or 150 minutes of moderate intensity exercise weekly.    The longer the better if tolerated.  Combine stretch, strength, and aerobic activities.  If you were told in the past that you have high risk for cardiovascular diseases, or if you are currently symptomatic, you may seek evaluation by your heart doctor prior to initiating moderate to intense exercise programs.                                  Additional Care Considerations for Diabetes/Prediabetes   -Diabetes  is a chronic disease.  The most important care consideration is regular follow-up with your diabetes care provider with the goal being avoiding or delaying its complications and to take advantage of advances in medications and technology.  If appropriate actions are taken early enough, type 2 diabetes can even be   reversed.  Seek information from the right source.  - Whole Food, Plant Predominant Nutrition is highly recommended: Eat Plenty of vegetables, Mushrooms, fruits, Legumes, Whole Grains, Nuts, seeds in lieu of processed meats, processed snacks/pastries red meat, poultry, eggs as recommended by American College of  Lifestyle Medicine (ACLM).  -Type 2 diabetes is known to coexist with other important comorbidities such as high blood pressure and high cholesterol.  It is critical to control not only the  diabetes but also the high blood pressure and high cholesterol to minimize and delay the risk of complications including coronary artery disease, stroke, amputations, blindness, etc.  The good news is that this diet recommendation for type 2 diabetes is also very helpful for managing high cholesterol and high blood blood pressure.  - Studies showed that people with diabetes will benefit from a class of medications known as ACE inhibitors and statins.  Unless there are specific reasons not to be on these medications, the standard of care is to consider getting one from these groups of medications at an optimal doses.  These medications are generally considered safe and proven to help protect the heart and the kidneys.    - People with diabetes are encouraged to initiate and maintain regular follow-up with eye doctors, foot doctors, dentists , and if necessary heart and kidney doctors.     - It is highly recommended that people with diabetes quit smoking or stay away from smoking, and get yearly  flu vaccine and pneumonia vaccine at least every 5 years.  See above for additional recommendations on exercise, sleep, stress management , and healthy social connections.      

## 2022-08-03 NOTE — Progress Notes (Signed)
08/03/2022          Endocrinology follow-up note   Subjective:    Patient ID: Matthew Sloan, male    DOB: [birth year], PCP Ave Filter, MD.   Past Medical History:  Diagnosis Date   Allergy    Chronic pain due to trauma    Disturbance of skin sensation    Intercostal neuralgia    Lumbosacral neuritis    Myocardial infarction [facility])    Rotator cuff (capsule) sprain    Snores     Past Surgical History:  Procedure Laterality Date   bilateral tubes placed in ears  04/12/2012   CARPAL TUNNEL RELEASE  12/15/2011   Procedure: CARPAL TUNNEL RELEASE;  Surgeon: [name], MD;  Location: [facility];  Service: Orthopedics;  Laterality: Left;   MULTIPLE TOOTH EXTRACTIONS     SHOULDER SURGERY  2012   lt   ULNAR TUNNEL RELEASE  12/15/2011   Procedure: CUBITAL TUNNEL RELEASE;  Surgeon: [name], MD;  Location: [facility];  Service: Orthopedics;  Laterality: Left;  left cubital tunnel release    Social History   Socioeconomic History   Marital status: Single    Spouse name: Not on file   Number of children: Not on file   Years of education: Not on file   Highest education level: Not on file  Occupational History   Not on file  Tobacco Use   Smoking status: Every Day    Packs/day: 1.00    Years: 30.00    Total pack years: 30.00    Types: Cigarettes   Smokeless tobacco: Never   Tobacco comments:    working on it-down to 0.5 ppd  Vaping Use   Vaping Use: Never used  Substance and Sexual Activity   Alcohol use: No   Drug use: No   Sexual activity: Not on file  Other Topics Concern   Not on file  Social History Narrative   Not on file   Social Determinants of Health   Financial Resource Strain: Not on file  Food Insecurity: Not on file  Transportation Needs: Not on file  Physical Activity: Not on file  Stress: Not on file  Social Connections: Not on file    Family History  Problem Relation Age of Onset   Cancer Mother 55        breast cancer   Heart disease Father 3       CHF    Outpatient Encounter Medications as of 08/03/2022  Medication Sig   Cholecalciferol (VITAMIN D3) 25 MCG (1000 UT) CAPS Take 1 capsule by mouth daily.   methocarbamol (ROBAXIN) 500 MG tablet Take 1 tablet (500 mg total) by mouth 3 (three) times daily. (Patient taking differently: Take 500 mg by mouth 3 (three) times daily as needed.)   albuterol (VENTOLIN HFA) 108 (90 Base) MCG/ACT inhaler Inhale 2 puffs into the lungs as needed.   apixaban (ELIQUIS) 5 MG TABS tablet Take 5 mg by mouth 2 (two) times daily.   levothyroxine (SYNTHROID) 25 MCG tablet Take 1 tablet (25 mcg total) by mouth daily before breakfast.   metFORMIN (GLUCOPHAGE) 500 MG tablet Take 500 mg by mouth daily.   metoprolol succinate (TOPROL-XL) 50 MG 24 hr tablet Take 50 mg by mouth daily.   Oxycodone HCl 10 MG TABS Take 1 tablet (10 mg total) by mouth 5 (five) times daily as needed.   pravastatin (PRAVACHOL) 20 MG tablet Take 20 mg by mouth  at bedtime.   traZODone (DESYREL) 50 MG tablet Take 1 tablet (50 mg total) by mouth at bedtime.   [DISCONTINUED] gabapentin (NEURONTIN) 800 MG tablet Take 1 tablet (800 mg total) by mouth 3 (three) times daily. (Patient not taking: Reported on 02/10/2022)   [DISCONTINUED] levothyroxine (SYNTHROID) 50 MCG tablet Take 1 tablet (50 mcg total) by mouth daily before breakfast.   No facility-administered encounter medications on file as of 08/03/2022.    ALLERGIES: Allergies  Allergen Reactions   Penicillins     VACCINATION STATUS:  There is no immunization history on file for this patient.   HPI  Matthew Sloan is 62 y.o. male who is presenting to follow-up after he was given radioactive iodine for treatment of hyperthyroidism from Graves' disease.    Referral requested by  Renee Ramus, MD. After treatment with RAI he developed hypothyroidism for which she was started on levothyroxine.  During his last visit in March he  was kept on levothyroxine 50 mcg p.o. daily before breakfast.   Patient presents with thyroid function test consistent with slight over-replacement.    In the interim, he was diagnosed with type 2 diabetes and put on metformin 500 mg p.o. daily.    He presents with steady weight.  He continues to smoke, has fatigue and dyspnea on exertion at baseline.   He denies palpitation, tremors,/nor heat/cold intolerance.  He denies dysphagia, shortness of breath.   He is known to have goiter with no discrete nodules. -He denies recent voice change.  He is a chronic active smoker.   he denies family history of thyroid dysfunction.  He denies  family hx of thyroid cancer.                            Review of systems As above.   Objective:    BP 136/88   Pulse (!) 56   Ht 5\' 9"  (1.753 m)   BMI 37.51 kg/m   Wt Readings from Last 3 Encounters:  07/31/22 254 lb (115.2 kg)  04/21/22 259 lb (117.5 kg)  02/10/22 261 lb 6.4 oz (118.6 kg)        Neck exam: Thyroid generally smaller than last exam.  No gross palpable goiter.                  CMP     Component Value Date/Time   NA 139 01/03/2010 0545   K 4.3 01/03/2010 0545   CL 103 01/03/2010 0545   CO2 26 01/03/2010 0545   GLUCOSE 104 (H) 01/03/2010 0545   BUN 14 01/03/2010 0545   CREATININE 0.89 01/03/2010 0545   CALCIUM 8.7 01/03/2010 0545   PROT 6.7 10/10/2012 1031   ALBUMIN 4.3 10/10/2012 1031   AST 18 10/10/2012 1031   ALT 30 10/10/2012 1031   ALKPHOS 50 10/10/2012 1031   BILITOT 0.4 10/10/2012 1031   GFRNONAA >60 01/03/2010 0545   GFRAA  01/03/2010 0545    >60        The eGFR has been calculated using the MDRD equation. This calculation has not been validated in all clinical situations. eGFR's persistently <60 mL/min signify possible Chronic Kidney Disease.     CBC    Component Value Date/Time   WBC 8.6 01/07/2010 0945   RBC 3.81 (L) 01/07/2010 0945   HGB 15.7 12/15/2011 0809   HCT 33.9 (L) 01/07/2010 0945    PLT 514 (H) 01/07/2010 0945  MCV 89.1 01/07/2010 0945   MCH 29.6 01/07/2010 0945   MCHC 33.3 01/07/2010 0945   RDW 13.6 01/07/2010 0945   LYMPHSABS 2.3 01/07/2010 0945   MONOABS 0.6 01/07/2010 0945   EOSABS 0.7 01/07/2010 0945   BASOSABS 0.1 01/07/2010 0945   Recent Results (from the past 2160 hour(s))  T4, free     Status: None   Collection Time: 07/30/22  2:53 PM  Result Value Ref Range   Free T4 1.46 0.82 - 1.77 ng/dL  TSH     Status: Abnormal   Collection Time: 07/30/22  2:53 PM  Result Value Ref Range   TSH 0.014 (L) 0.450 - 4.500 uIU/mL    April 24, 2020 thyroid uptake and scan: 24-hour uptake 56.9%.  Normal range 15 to 35%.  Previously, the 24-hour uptake was similarly elevated to 57.3%. Impression: Homogeneously increased uptake within an enlarged thyroid gland, is suggestive of Graves' disease.   RAI was given to treat hyperthyroidism in November 2021.  Assessment & Plan:   1.  RAI induced hypothyroidism  2.  Graves' disease-resolved 3.  Type 2 diabetes See notes from prior visits.  He has not been consistent taking his levothyroxine.  His recent labs showed suppressed TSH, free T4 within normal range.  He is advised to lower his levothyroxine to 25 mcg p.o. daily before breakfast.  There is some chance that he may not need thyroid hormone replacement.   - We discussed about the correct intake of his thyroid hormone, on empty stomach at fasting, with water, separated by at least 30 minutes from breakfast and other medications,  and separated by more than 4 hours from calcium, iron, multivitamins, acid reflux medications (PPIs). -Patient is made aware of the fact that thyroid hormone replacement is needed for life, dose to be adjusted by periodic monitoring of thyroid function tests.   There is apparent reduction in size of his thyroid as a result of radioactive iodine treatment. Advised to continue his metformin 500 mg p.o. daily at breakfast.  He is advised on  restrictions of ultra processed foods, and drinks.  He is advised to eat more whole food plant-based diet.  He will have A1c at point-of-care next visit.  - I advised him to maintain close follow up with Renee Ramus, MD for primary care needs.  I spent 21 minutes in the care of the patient today including review of labs from Thyroid Function, CMP, and other relevant labs ; imaging/biopsy records (current and previous including abstractions from other facilities); face-to-face time discussing  his lab results and symptoms, medications doses, his options of short and long term treatment based on the latest standards of care / guidelines;   and documenting the encounter.  [name]  participated in the discussions, expressed understanding, and voiced agreement with the above plans.  All questions were answered to his satisfaction. he is encouraged to contact clinic should he have any questions or concerns prior to his return visit.   Follow up plan: Return in about 3 months (around 11/02/2022) for Fasting Labs  in AM B4 8, A1c -NV.   Thank you for involving me in the care of this pleasant patient, and I will continue to update you with his progress.  Glade Lloyd, MD [facility] Phone: [phone]  Fax: [phone]   08/03/2022, 5:37 PM  This note was partially dictated with voice recognition software. Similar sounding words can be transcribed inadequately or may not  be corrected upon review.

## 2022-08-13 HISTORY — PX: TOTAL HIP ARTHROPLASTY: SHX124

## 2022-09-08 ENCOUNTER — Telehealth: Payer: Self-pay | Admitting: Registered Nurse

## 2022-09-08 MED ORDER — OXYCODONE HCL 10 MG PO TABS: 10.0000 mg | ORAL_TABLET | Freq: Every day | ORAL | 0 refills | Status: DC | PRN

## 2022-09-08 NOTE — Telephone Encounter (Signed)
Return Matthew Sloan call,  He states he had his Right Hip surgery on 08/13/2022.  PMP was Reviewed.  Oxycodone e-scribed today, he verbalizes understanding.

## 2022-09-08 NOTE — Addendum Note (Signed)
Addended by: Bayard Hugger on: 09/08/2022 03:13 PM   Modules accepted: Orders

## 2022-09-08 NOTE — Telephone Encounter (Signed)
Patient called requesting medication refill on oxycodone , states he only has 2 pills left and also states he had his hip replacement 2/1 . [facility] uses [facility]

## 2022-09-25 ENCOUNTER — Ambulatory Visit: Payer: Medicare HMO | Admitting: Registered Nurse

## 2022-10-02 ENCOUNTER — Encounter: Payer: Medicare HMO | Attending: Physical Medicine & Rehabilitation | Admitting: Registered Nurse

## 2022-10-02 VITALS — BP 132/53 | HR 70 | Ht 69.0 in | Wt 252.0 lb

## 2022-10-02 DIAGNOSIS — M25512 Pain in left shoulder: Secondary | ICD-10-CM | POA: Insufficient documentation

## 2022-10-02 DIAGNOSIS — G894 Chronic pain syndrome: Secondary | ICD-10-CM

## 2022-10-02 DIAGNOSIS — M546 Pain in thoracic spine: Secondary | ICD-10-CM | POA: Diagnosis present

## 2022-10-02 DIAGNOSIS — G8929 Other chronic pain: Secondary | ICD-10-CM | POA: Diagnosis present

## 2022-10-02 DIAGNOSIS — Z5181 Encounter for therapeutic drug level monitoring: Secondary | ICD-10-CM

## 2022-10-02 DIAGNOSIS — M8700 Idiopathic aseptic necrosis of unspecified bone: Secondary | ICD-10-CM | POA: Diagnosis present

## 2022-10-02 DIAGNOSIS — Z79891 Long term (current) use of opiate analgesic: Secondary | ICD-10-CM

## 2022-10-02 DIAGNOSIS — G4709 Other insomnia: Secondary | ICD-10-CM

## 2022-10-02 DIAGNOSIS — M519 Unspecified thoracic, thoracolumbar and lumbosacral intervertebral disc disorder: Secondary | ICD-10-CM

## 2022-10-02 DIAGNOSIS — M5416 Radiculopathy, lumbar region: Secondary | ICD-10-CM | POA: Diagnosis present

## 2022-10-02 DIAGNOSIS — M2550 Pain in unspecified joint: Secondary | ICD-10-CM

## 2022-10-02 MED ORDER — OXYCODONE HCL 10 MG PO TABS: 10.0000 mg | ORAL_TABLET | Freq: Every day | ORAL | 0 refills | Status: DC | PRN

## 2022-10-02 NOTE — Progress Notes (Unsigned)
Subjective:    Patient ID: Matthew Sloan, male    DOB: 01-07-1961, 62 y.o.   MRN: [id number]  HPI: Matthew Sloan is a 62 y.o. male who returns for follow up appointment for chronic pain and medication refill. states *** pain is located in  ***. rates pain ***. current exercise regime is walking and performing stretching exercises.  Matthew Sloan equivalent is *** MME.   Oral Swab was Ordered today.     Pain Inventory Average Pain 10 Pain Right Now 9 My pain is constant, sharp, burning, dull, stabbing, tingling, and aching  In the last 24 hours, has pain interfered with the following? General activity 10 Relation with others 8 Enjoyment of life 8 What TIME of day is your pain at its worst? morning , daytime, evening, and night Sleep (in general) Poor  Pain is worse with: walking, bending, sitting, and standing Pain improves with:  n/a Relief from Meds: 2  Family History  Problem Relation Age of Onset   Cancer Mother 23       breast cancer   Heart disease Father 35       CHF   Social History   Socioeconomic History   Marital status: Single    Spouse name: Not on file   Number of children: Not on file   Years of education: Not on file   Highest education level: Not on file  Occupational History   Not on file  Tobacco Use   Smoking status: Every Day    Packs/day: 1.00    Years: 30.00    Additional pack years: 0.00    Total pack years: 30.00    Types: Cigarettes   Smokeless tobacco: Never   Tobacco comments:    working on it-down to 0.5 ppd  Vaping Use   Vaping Use: Never used  Substance and Sexual Activity   Alcohol use: No   Drug use: No   Sexual activity: Not on file  Other Topics Concern   Not on file  Social History Narrative   Not on file   Social Determinants of Health   Financial Resource Strain: Not on file  Food Insecurity: Not on file  Transportation Needs: Not on file  Physical Activity: Not on file  Stress: Not on file  Social  Connections: Not on file   Past Surgical History:  Procedure Laterality Date   bilateral tubes placed in ears  04/12/2012   CARPAL TUNNEL RELEASE  12/15/2011   Procedure: CARPAL TUNNEL RELEASE;  Surgeon: Tennis Must, MD;  Location: [place];  Service: Orthopedics;  Laterality: Left;   MULTIPLE TOOTH EXTRACTIONS     SHOULDER SURGERY  2012   lt   ULNAR TUNNEL RELEASE  12/15/2011   Procedure: CUBITAL TUNNEL RELEASE;  Surgeon: Tennis Must, MD;  Location: [place];  Service: Orthopedics;  Laterality: Left;  left cubital tunnel release   Past Surgical History:  Procedure Laterality Date   bilateral tubes placed in ears  04/12/2012   CARPAL TUNNEL RELEASE  12/15/2011   Procedure: CARPAL TUNNEL RELEASE;  Surgeon: Tennis Must, MD;  Location: [place];  Service: Orthopedics;  Laterality: Left;   MULTIPLE TOOTH EXTRACTIONS     SHOULDER SURGERY  2012   lt   ULNAR TUNNEL RELEASE  12/15/2011   Procedure: CUBITAL TUNNEL RELEASE;  Surgeon: Tennis Must, MD;  Location: [place];  Service: Orthopedics;  Laterality: Left;  left cubital tunnel release   Past Medical History:  Diagnosis Date   Allergy    Chronic pain due to trauma    Disturbance of skin sensation    Intercostal neuralgia    Lumbosacral neuritis    Myocardial infarction (HCC)    Rotator cuff (capsule) sprain    Snores    There were no vitals taken for this visit.  Opioid Risk Score:   Fall Risk Score:  `1  Depression screen [facility] 2/9     07/31/2022    9:44 AM 01/29/2022   10:08 AM 11/27/2021    9:58 AM 09/05/2021    9:22 AM 07/01/2021    8:56 AM 03/07/2021   10:20 AM 08/16/2020    9:09 AM  Depression screen PHQ 2/9  Decreased Interest 0 0 0 0 0 0 0  Down, Depressed, Hopeless 0 0 0 0 0 0 0  PHQ - 2 Score 0 0 0 0 0 0 0     Review of Systems  Musculoskeletal:  Positive for back pain.       B/L shoulders arms leg feet  All other systems reviewed and are  negative.      Objective:   Physical Exam        Assessment & Plan:  1. 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 07/31/2022 Refilled: Oxycodone 10 mg one tablet 5 times a day  as needed for pain #150.Second script sent for the following month to accommodate scheduled appointment.  We will continue the opioid monitoring program, this consists of regular clinic visits, examinations, urine drug screen, pill counts as well as use of [place] Controlled Substance Reporting system. A 12 month History has been reviewed on the [place] Controlled Substance Reporting System 07/31/2022.  2. Lumbar Radiculitis: Continue current medication regimen with Gabapentin. 07/31/2022 3. Intercostal neuralgia: Unable to continue Flector Patch due to cost, Continue current medication regimen with Voltaren Gel, Robaxin and Neurontin. 07/31/2022.  4. Insomnia: RX:  trazodone. 07/31/2022 5. Muscle Spasm: Continue current medication regimen with Robaxin. 01/192024 6. Polyarthralgia: Continue current medication regime. Continue to Monitor. 07/31/2022 7. Chronic Bilateral Thoracic Pain: Continue HEP and Continue Current medication regimen. Continue to monitor. 07/31/2022 8. Chronic Right Hip Pain/ Avascular Necrosis: Matthew Sloan states he's awaiting a surgery date with Dr.  Cleda Mccreedy  . 07/31/2022 9. Cervical Lymphadenopathy: Endocrinologist Following. 07/31/2022 S/P Radioactive Iodine Thyroid Ablation on 05/13/2020,Continue to monitor. 07/31/2022 10. Left Elbow Pain: No complaints today.  Ortho following. Continue to monitor.    F/U in 2 months:

## 2022-10-06 ENCOUNTER — Encounter: Payer: Self-pay | Admitting: Registered Nurse

## 2022-10-06 LAB — DRUG TOX MONITOR 1 W/CONF, ORAL FLD
Amphetamines: NEGATIVE ng/mL
Barbiturates: NEGATIVE ng/mL
Benzodiazepines: NEGATIVE ng/mL
Buprenorphine: NEGATIVE ng/mL
Cocaine: NEGATIVE ng/mL
Codeine: NEGATIVE ng/mL
Cotinine: 238.4 ng/mL — ABNORMAL HIGH
Dihydrocodeine: NEGATIVE ng/mL
Fentanyl: NEGATIVE ng/mL
Heroin Metabolite: NEGATIVE ng/mL
Hydrocodone: NEGATIVE ng/mL
Hydromorphone: NEGATIVE ng/mL
MARIJUANA: NEGATIVE ng/mL
MDMA: NEGATIVE ng/mL
Meprobamate: NEGATIVE ng/mL
Methadone: NEGATIVE ng/mL
Morphine: NEGATIVE ng/mL
Nicotine Metabolite: POSITIVE ng/mL — AB
Norhydrocodone: NEGATIVE ng/mL
Noroxycodone: 38.7 ng/mL — ABNORMAL HIGH
Opiates: POSITIVE ng/mL — AB
Oxycodone: >250 ng/mL — ABNORMAL HIGH
Oxymorphone: 3.5 ng/mL — ABNORMAL HIGH
Phencyclidine: NEGATIVE ng/mL
Tapentadol: NEGATIVE ng/mL
Tramadol: NEGATIVE ng/mL
Zolpidem: NEGATIVE ng/mL

## 2022-10-06 LAB — DRUG TOX ALC METAB W/CON, ORAL FLD: Alcohol Metabolite: NEGATIVE ng/mL (ref <25)

## 2022-10-13 ENCOUNTER — Telehealth: Payer: Self-pay | Admitting: *Deleted

## 2022-10-13 NOTE — Telephone Encounter (Signed)
Oral swab drug screen was consistent for prescribed medications.  ?

## 2022-10-30 LAB — COMPREHENSIVE METABOLIC PANEL
ALT: 12 IU/L (ref 0–44)
AST: 14 IU/L (ref 0–40)
Albumin/Globulin Ratio: 2.1 (ref 1.2–2.2)
Albumin: 4.7 g/dL (ref 3.9–4.9)
Alkaline Phosphatase: 68 IU/L (ref 44–121)
BUN/Creatinine Ratio: 9 — ABNORMAL LOW (ref 10–24)
BUN: 10 mg/dL (ref 8–27)
Bilirubin Total: 0.4 mg/dL (ref 0.0–1.2)
CO2: 22 mmol/L (ref 20–29)
Calcium: 9.5 mg/dL (ref 8.6–10.2)
Chloride: 98 mmol/L (ref 96–106)
Creatinine, Ser: 1.12 mg/dL (ref 0.76–1.27)
Globulin, Total: 2.2 g/dL (ref 1.5–4.5)
Glucose: 193 mg/dL — ABNORMAL HIGH (ref 70–99)
Potassium: 4.1 mmol/L (ref 3.5–5.2)
Sodium: 139 mmol/L (ref 134–144)
Total Protein: 6.9 g/dL (ref 6.0–8.5)
eGFR: 74 mL/min/{1.73_m2} (ref >59)

## 2022-10-30 LAB — LIPID PANEL
Chol/HDL Ratio: 4 ratio (ref 0.0–5.0)
Cholesterol, Total: 127 mg/dL (ref 100–199)
HDL: 32 mg/dL — ABNORMAL LOW (ref >39)
LDL Chol Calc (NIH): 70 mg/dL (ref 0–99)
Triglycerides: 145 mg/dL (ref 0–149)
VLDL Cholesterol Cal: 25 mg/dL (ref 5–40)

## 2022-10-30 LAB — TSH: TSH: 0.421 u[IU]/mL — ABNORMAL LOW (ref 0.450–4.500)

## 2022-10-30 LAB — T4, FREE: Free T4: 0.96 ng/dL (ref 0.82–1.77)

## 2022-11-03 ENCOUNTER — Ambulatory Visit: Payer: Medicare HMO | Admitting: "Endocrinology

## 2022-11-25 ENCOUNTER — Ambulatory Visit (INDEPENDENT_AMBULATORY_CARE_PROVIDER_SITE_OTHER): Payer: Medicare HMO | Admitting: "Endocrinology

## 2022-11-25 ENCOUNTER — Encounter: Payer: Self-pay | Admitting: "Endocrinology

## 2022-11-25 VITALS — BP 116/82 | HR 72 | Ht 69.0 in | Wt 251.4 lb

## 2022-11-25 DIAGNOSIS — E119 Type 2 diabetes mellitus without complications: Secondary | ICD-10-CM | POA: Diagnosis not present

## 2022-11-25 DIAGNOSIS — E890 Postprocedural hypothyroidism: Secondary | ICD-10-CM

## 2022-11-25 DIAGNOSIS — Z6837 Body mass index (BMI) 37.0-37.9, adult: Secondary | ICD-10-CM

## 2022-11-25 DIAGNOSIS — Z7984 Long term (current) use of oral hypoglycemic drugs: Secondary | ICD-10-CM | POA: Diagnosis not present

## 2022-11-25 NOTE — Patient Instructions (Signed)

## 2022-11-25 NOTE — Progress Notes (Signed)
11/25/2022          Endocrinology follow-up note   Subjective:    Patient ID: Matthew Sloan, male    DOB: 30-Jun-1961, PCP Ave Filter, MD.   Past Medical History:  Diagnosis Date   Allergy    Chronic pain due to trauma    Disturbance of skin sensation    Intercostal neuralgia    Lumbosacral neuritis    Myocardial infarction [facility])    Rotator cuff (capsule) sprain    Snores     Past Surgical History:  Procedure Laterality Date   bilateral tubes placed in ears  04/12/2012   CARPAL TUNNEL RELEASE  12/15/2011   Procedure: CARPAL TUNNEL RELEASE;  Surgeon: [name], MD;  Location: [facility];  Service: Orthopedics;  Laterality: Left;   MULTIPLE TOOTH EXTRACTIONS     SHOULDER SURGERY  07/13/2010   lt   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   ULNAR TUNNEL RELEASE  12/15/2011   Procedure: CUBITAL TUNNEL RELEASE;  Surgeon: [name], MD;  Location: [facility];  Service: Orthopedics;  Laterality: Left;  left cubital tunnel release    Social History   Socioeconomic History   Marital status: Single    Spouse name: Not on file   Number of children: Not on file   Years of education: Not on file   Highest education level: Not on file  Occupational History   Not on file  Tobacco Use   Smoking status: Every Day    Packs/day: 1.00    Years: 30.00    Additional pack years: 0.00    Total pack years: 30.00    Types: Cigarettes   Smokeless tobacco: Never   Tobacco comments:    working on it-down to 0.5 ppd  Vaping Use   Vaping Use: Never used  Substance and Sexual Activity   Alcohol use: No   Drug use: No   Sexual activity: Not on file  Other Topics Concern   Not on file  Social History Narrative   Not on file   Social Determinants of Health   Financial Resource Strain: Not on file  Food Insecurity: Not on file  Transportation Needs: Not on file  Physical Activity: Not on file  Stress: Not on file  Social Connections: Not  on file    Family History  Problem Relation Age of Onset   Cancer Mother 54       breast cancer   Heart disease Father 70       CHF    Outpatient Encounter Medications as of 11/25/2022  Medication Sig   albuterol (VENTOLIN HFA) 108 (90 Base) MCG/ACT inhaler Inhale 2 puffs into the lungs as needed.   apixaban (ELIQUIS) 5 MG TABS tablet Take 5 mg by mouth 2 (two) times daily.   Cholecalciferol (VITAMIN D3) 25 MCG (1000 UT) CAPS Take 1 capsule by mouth daily.   levothyroxine (SYNTHROID) 25 MCG tablet Take 1 tablet (25 mcg total) by mouth daily before breakfast.   metFORMIN (GLUCOPHAGE) 500 MG tablet Take 500 mg by mouth daily.   methocarbamol (ROBAXIN) 500 MG tablet Take 1 tablet (500 mg total) by mouth 3 (three) times daily. (Patient taking differently: Take 500 mg by mouth 3 (three) times daily as needed.)   metoprolol succinate (TOPROL-XL) 50 MG 24 hr tablet Take 50 mg by mouth daily.   Oxycodone HCl 10 MG TABS Take 1 tablet (10 mg total) by mouth 5 (five) times daily  as needed.   pravastatin (PRAVACHOL) 20 MG tablet Take 20 mg by mouth at bedtime.   traZODone (DESYREL) 50 MG tablet Take 1 tablet (50 mg total) by mouth at bedtime.   No facility-administered encounter medications on file as of 11/25/2022.    ALLERGIES: Allergies  Allergen Reactions   Penicillins     VACCINATION STATUS:  There is no immunization history on file for this patient.   HPI  Matthew Sloan is 62 y.o. male who is presenting to follow-up after he was given radioactive iodine for treatment of hyperthyroidism from Graves' disease.    Referral requested by  Ave Filter, MD. After treatment with RAI he developed hypothyroidism for which she was started on levothyroxine. He has only partial response evident from  the fact that he is responding to low-dose levothyroxine currently at 25 mcg p.o. daily before breakfast.  Patient presents with thyroid function test consistent with improving, more  appropriate replacement.    In the interim, he was diagnosed with type 2 diabetes and put on metformin 500 mg p.o. daily.  His most recent A1c was 7.2%.  He presents with steady weight.  He continues to smoke, has fatigue and dyspnea on exertion at baseline.  He complains of fatigue.   He denies palpitation, tremors,/nor heat/cold intolerance.  He denies dysphagia, shortness of breath.   He is known to have goiter with no discrete nodules. -He denies recent voice change.  He is a chronic active smoker.   he denies family history of thyroid dysfunction.  He denies  family hx of thyroid cancer.                            Review of systems As above.   Objective:    BP 116/82   Pulse 72   Ht 5\' 9"  (1.753 m)   Wt 251 lb 6.4 oz (114 kg)   BMI 37.13 kg/m   Wt Readings from Last 3 Encounters:  11/25/22 251 lb 6.4 oz (114 kg)  10/02/22 252 lb (114.3 kg)  07/31/22 254 lb (115.2 kg)        Neck exam: Thyroid generally smaller than last exam.  No gross palpable goiter.                  CMP     Component Value Date/Time   NA 139 10/29/2022 1048   K 4.1 10/29/2022 1048   CL 98 10/29/2022 1048   CO2 22 10/29/2022 1048   GLUCOSE 193 (H) 10/29/2022 1048   GLUCOSE 104 (H) 01/03/2010 0545   BUN 10 10/29/2022 1048   CREATININE 1.12 10/29/2022 1048   CALCIUM 9.5 10/29/2022 1048   PROT 6.9 10/29/2022 1048   ALBUMIN 4.7 10/29/2022 1048   AST 14 10/29/2022 1048   ALT 12 10/29/2022 1048   ALKPHOS 68 10/29/2022 1048   BILITOT 0.4 10/29/2022 1048   GFRNONAA >60 01/03/2010 0545   GFRAA  01/03/2010 0545    >60        The eGFR has been calculated using the MDRD equation. This calculation has not been validated in all clinical situations. eGFR's persistently <60 mL/min signify possible Chronic Kidney Disease.     CBC    Component Value Date/Time   WBC 8.6 01/07/2010 0945   RBC 3.81 (L) 01/07/2010 0945   HGB 15.7 12/15/2011 0809   HCT 33.9 (L) 01/07/2010 0945   PLT 514 (H)  01/07/2010 0945  MCV 89.1 01/07/2010 0945   MCH 29.6 01/07/2010 0945   MCHC 33.3 01/07/2010 0945   RDW 13.6 01/07/2010 0945   LYMPHSABS 2.3 01/07/2010 0945   MONOABS 0.6 01/07/2010 0945   EOSABS 0.7 01/07/2010 0945   BASOSABS 0.1 01/07/2010 0945   Recent Results (from the past 2160 hour(s))  Drug Tox Monitor 1 w/Conf, Oral Fluid     Status: Abnormal   Collection Time: 10/02/22 11:00 AM  Result Value Ref Range   Amphetamines NEGATIVE <10 ng/mL   Barbiturates NEGATIVE <10 ng/mL   Benzodiazepines NEGATIVE <0.50 ng/mL   Buprenorphine NEGATIVE <0.10 ng/mL   Cocaine NEGATIVE <5.0 ng/mL   Fentanyl NEGATIVE <0.10 ng/mL   Heroin Metabolite NEGATIVE <1.0 ng/mL   MARIJUANA NEGATIVE <2.5 ng/mL   MDMA NEGATIVE <10 ng/mL   Meprobamate NEGATIVE <2.5 ng/mL   Methadone NEGATIVE <5.0 ng/mL   Nicotine Metabolite POSITIVE (A) <5.0 ng/mL   Cotinine 238.4 (H) <5.0 ng/mL    Comment: . Cotinine is a metabolite of nicotine.    Opiates POSITIVE (A) <2.5 ng/mL   Codeine Negative <2.5 ng/mL   Dihydrocodeine Negative <2.5 ng/mL   Hydrocodone Negative <2.5 ng/mL   Hydromorphone Negative <2.5 ng/mL   Morphine Negative <2.5 ng/mL   Norhydrocodone Negative <2.5 ng/mL   Noroxycodone 38.7 (H) <2.5 ng/mL    Comment: . Noroxycodone is a metabolite of oxycodone.    Oxycodone >250.0 (H) <2.5 ng/mL   Oxymorphone 3.5 (H) <2.5 ng/mL    Comment: . Oxymorphone is a metabolite of oxycodone as well as a prescribed drug.    Phencyclidine NEGATIVE <10 ng/mL   Tapentadol NEGATIVE <5.0 ng/mL   Tramadol NEGATIVE <5.0 ng/mL   Zolpidem NEGATIVE <5.0 ng/mL    Comment: . For additional information, please refer to http://education.[facility].com/faq/FAQ186 (This link is being provided for informational/ educational purposes only.) . This drug testing is for medical treatment only. Analysis was performed as non-forensic testing and these results should be used only by healthcare providers to render  diagnosis or treatment, or to monitor progress of medical conditions. . For assistance with interpreting these drug results, please contact a [employer] Toxicology Specialist: [phone] TOX [phone]), M-F, 8am-6pm EST. [name] These tests were developed and their analytical performance characteristics have been determined by [facility]. They have not been cleared or approved by the FDA. These assays have been validated pursuant to the CLIA regulations and are used for clinical purposes.   Drug Tox Alc Metab w/Con, Oral Fluid     Status: None   Collection Time: 10/02/22 11:00 AM  Result Value Ref Range   Alcohol Metabolite NEGATIVE <25 ng/mL    Comment: . For additional information, please refer to http://education.[facility].com/faq/FAQ183 (This link is being provided for informational/ educational purposes only.) . This drug testing is for medical treatment only. Analysis was performed as non-forensic testing and these results should be used only by healthcare providers to render diagnosis or treatment, or to monitor progress of medical conditions. . For assistance with interpreting these drug results, please contact a [employer] Toxicology Specialist: [phone] TOX [phone]), M-F, 8am-6pm EST. [name] These tests were developed and their analytical performance characteristics have been determined by [facility]. They have not been cleared or approved by the FDA. These assays have been validated pursuant to the CLIA regulations and are used for clinical purposes.   TSH     Status: Abnormal   Collection Time: 10/29/22 10:48 AM  Result Value Ref Range   TSH 0.421 (L) 0.450 -  4.500 uIU/mL  T4, free     Status: None   Collection Time: 10/29/22 10:48 AM  Result Value Ref Range   Free T4 0.96 0.82 - 1.77 ng/dL  Comprehensive metabolic panel     Status: Abnormal   Collection Time: 10/29/22 10:48 AM  Result Value Ref Range    Glucose 193 (H) 70 - 99 mg/dL   BUN 10 8 - 27 mg/dL   Creatinine, Ser 1.61 0.76 - 1.27 mg/dL   eGFR 74 >09 UE/AVW/0.98   BUN/Creatinine Ratio 9 (L) 10 - 24   Sodium 139 134 - 144 mmol/L   Potassium 4.1 3.5 - 5.2 mmol/L   Chloride 98 96 - 106 mmol/L   CO2 22 20 - 29 mmol/L   Calcium 9.5 8.6 - 10.2 mg/dL   Total Protein 6.9 6.0 - 8.5 g/dL   Albumin 4.7 3.9 - 4.9 g/dL   Globulin, Total 2.2 1.5 - 4.5 g/dL   Albumin/Globulin Ratio 2.1 1.2 - 2.2   Bilirubin Total 0.4 0.0 - 1.2 mg/dL   Alkaline Phosphatase 68 44 - 121 IU/L   AST 14 0 - 40 IU/L   ALT 12 0 - 44 IU/L  Lipid panel     Status: Abnormal   Collection Time: 10/29/22 10:48 AM  Result Value Ref Range   Cholesterol, Total 127 100 - 199 mg/dL   Triglycerides 119 0 - 149 mg/dL   HDL 32 (L) >14 mg/dL   VLDL Cholesterol Cal 25 5 - 40 mg/dL   LDL Chol Calc (NIH) 70 0 - 99 mg/dL   Chol/HDL Ratio 4.0 0.0 - 5.0 ratio    Comment:                                   T. Chol/HDL Ratio                                             Men  Women                               1/2 Avg.Risk  3.4    3.3                                   Avg.Risk  5.0    4.4                                2X Avg.Risk  9.6    7.1                                3X Avg.Risk 23.4   11.0     April 24, 2020 thyroid uptake and scan: 24-hour uptake 56.9%.  Normal range 15 to 35%.  Previously, the 24-hour uptake was similarly elevated to 57.3%. Impression: Homogeneously increased uptake within an enlarged thyroid gland, is suggestive of Graves' disease.   RAI was given to treat hyperthyroidism in November 2021.  Assessment & Plan:   1.  RAI induced hypothyroidism  2.  Graves' disease-resolved 3.  Morbid obesity complicated by type  2 diabetes See notes from prior visits.  He reports better consistency taking his levothyroxine this time.  His previsit labs are consistent with appropriate replacement.  He is advised to continue levothyroxine 25 mcg p.o. daily before  breakfast.    - We discussed about the correct intake of his thyroid hormone, on empty stomach at fasting, with water, separated by at least 30 minutes from breakfast and other medications,  and separated by more than 4 hours from calcium, iron, multivitamins, acid reflux medications (PPIs). -Patient is made aware of the fact that thyroid hormone replacement is needed for life, dose to be adjusted by periodic monitoring of thyroid function tests.   There is apparent reduction in size of his thyroid as a result of radioactive iodine treatment.  His most recent A1c was 7.2%.  He is advised to continue metformin 500 mg p.o. daily once a day at breakfast.   He is advised on restrictions of ultra processed foods, and drinks.  He is advised to eat more whole food plant-based diet.  He will have previsit labs including thyroid function test, lipid panel, CMP and next visit point-of-care A1c.    - I advised him to maintain close follow up with Ave Filter, MD for primary care needs.  I spent  25  minutes in the care of the patient today including review of labs from Thyroid Function, CMP, and other relevant labs ; imaging/biopsy records (current and previous including abstractions from other facilities); face-to-face time discussing  his lab results and symptoms, medications doses, his options of short and long term treatment based on the latest standards of care / guidelines;   and documenting the encounter.  Matthew Sloan  participated in the discussions, expressed understanding, and voiced agreement with the above plans.  All questions were answered to his satisfaction. he is encouraged to contact clinic should he have any questions or concerns prior to his return visit.    Follow up plan: Return in about 5 months (around 04/27/2023) for Fasting Labs  in AM B4 8, A1c -NV.   Thank you for involving me in the care of this pleasant patient, and I will continue to update you with his  progress.  Marquis Lunch, MD [facility] Phone: [phone]  Fax: [phone]   11/25/2022, 5:21 PM  This note was partially dictated with voice recognition software. Similar sounding words can be transcribed inadequately or may not  be corrected upon review.

## 2022-12-04 ENCOUNTER — Encounter: Payer: Self-pay | Admitting: Registered Nurse

## 2022-12-04 ENCOUNTER — Encounter: Payer: Medicare HMO | Attending: Physical Medicine & Rehabilitation | Admitting: Registered Nurse

## 2022-12-04 VITALS — BP 126/77 | HR 81 | Ht 69.0 in | Wt 242.0 lb

## 2022-12-04 DIAGNOSIS — M5416 Radiculopathy, lumbar region: Secondary | ICD-10-CM | POA: Diagnosis not present

## 2022-12-04 DIAGNOSIS — Z5181 Encounter for therapeutic drug level monitoring: Secondary | ICD-10-CM | POA: Insufficient documentation

## 2022-12-04 DIAGNOSIS — M8700 Idiopathic aseptic necrosis of unspecified bone: Secondary | ICD-10-CM | POA: Insufficient documentation

## 2022-12-04 DIAGNOSIS — G8929 Other chronic pain: Secondary | ICD-10-CM | POA: Diagnosis present

## 2022-12-04 DIAGNOSIS — G894 Chronic pain syndrome: Secondary | ICD-10-CM | POA: Diagnosis not present

## 2022-12-04 DIAGNOSIS — M519 Unspecified thoracic, thoracolumbar and lumbosacral intervertebral disc disorder: Secondary | ICD-10-CM | POA: Insufficient documentation

## 2022-12-04 DIAGNOSIS — G4709 Other insomnia: Secondary | ICD-10-CM | POA: Diagnosis present

## 2022-12-04 DIAGNOSIS — Z79891 Long term (current) use of opiate analgesic: Secondary | ICD-10-CM | POA: Insufficient documentation

## 2022-12-04 DIAGNOSIS — M25512 Pain in left shoulder: Secondary | ICD-10-CM | POA: Diagnosis not present

## 2022-12-04 DIAGNOSIS — M546 Pain in thoracic spine: Secondary | ICD-10-CM | POA: Diagnosis not present

## 2022-12-04 DIAGNOSIS — M2550 Pain in unspecified joint: Secondary | ICD-10-CM | POA: Diagnosis present

## 2022-12-04 MED ORDER — OXYCODONE HCL 10 MG PO TABS: 10.0000 mg | ORAL_TABLET | Freq: Every day | ORAL | 0 refills | Status: DC | PRN

## 2022-12-04 NOTE — Progress Notes (Signed)
Subjective:    Patient ID: Matthew Sloan, male    DOB: [birth year], 62 y.o.   MRN: [id number]  HPI: Matthew Sloan is a 62 y.o. male who returns for follow up appointment for chronic pain and medication refill. He states his pain is located in his left shoulder, mid- lower back and right hip pain. He rates his  pain 9. His current exercise regime is walking and performing stretching exercises.  Matthew Sloan Morphine equivalent is 75.00 MME.   Last Oral Swab was Performed on 10/02/2022, it was consistent.    Pain Inventory Average Pain 9 Pain Right Now 9 My pain is constant, sharp, burning, stabbing, tingling, and aching  In the last 24 hours, has pain interfered with the following? General activity 10 Relation with others 8 Enjoyment of life 8 What TIME of day is your pain at its worst? morning , daytime, evening, night, and varies Sleep (in general) Poor  Pain is worse with: walking, bending, sitting, and standing Pain improves with: medication Relief from Meds: 2  Family History  Problem Relation Age of Onset   Cancer Mother 57       breast cancer   Heart disease Father 67       CHF   Social History   Socioeconomic History   Marital status: Single    Spouse name: Not on file   Number of children: Not on file   Years of education: Not on file   Highest education level: Not on file  Occupational History   Not on file  Tobacco Use   Smoking status: Every Day    Packs/day: 1.00    Years: 30.00    Additional pack years: 0.00    Total pack years: 30.00    Types: Cigarettes   Smokeless tobacco: Never   Tobacco comments:    working on it-down to 0.5 ppd  Vaping Use   Vaping Use: Never used  Substance and Sexual Activity   Alcohol use: No   Drug use: No   Sexual activity: Not on file  Other Topics Concern   Not on file  Social History Narrative   Not on file   Social Determinants of Health   Financial Resource Strain: Not on file  Food Insecurity: Not on file   Transportation Needs: Not on file  Physical Activity: Not on file  Stress: Not on file  Social Connections: Not on file   Past Surgical History:  Procedure Laterality Date   bilateral tubes placed in ears  04/12/2012   CARPAL TUNNEL RELEASE  12/15/2011   Procedure: CARPAL TUNNEL RELEASE;  Surgeon: [name], MD;  Location: [facility];  Service: Orthopedics;  Laterality: Left;   MULTIPLE TOOTH EXTRACTIONS     SHOULDER SURGERY  07/13/2010   lt   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   ULNAR TUNNEL RELEASE  12/15/2011   Procedure: CUBITAL TUNNEL RELEASE;  Surgeon: [name], MD;  Location: [facility];  Service: Orthopedics;  Laterality: Left;  left cubital tunnel release   Past Surgical History:  Procedure Laterality Date   bilateral tubes placed in ears  04/12/2012   CARPAL TUNNEL RELEASE  12/15/2011   Procedure: CARPAL TUNNEL RELEASE;  Surgeon: [name], MD;  Location: [facility];  Service: Orthopedics;  Laterality: Left;   MULTIPLE TOOTH EXTRACTIONS     SHOULDER SURGERY  07/13/2010   lt   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  ULNAR TUNNEL RELEASE  12/15/2011   Procedure: CUBITAL TUNNEL RELEASE;  Surgeon: [name], MD;  Location:  SUR[facility]ERY CENTER;  Service: Orthopedics;  Laterality: Left;  left cubital tunnel release   Past Medical History:  Diagnosis Date   Allergy    Chronic pain due to trauma    Disturbance of skin sensation    Intercostal neuralgia    Lumbosacral neuritis    Myocardial infarction (HCC)    Rotator cuff (capsule) sprain    Snores    BP 126/77   Pulse 81   Ht 5\' 9"  (1.753 m)   Wt 242 lb (109.8 kg)   SpO2 93%   BMI 35.74 kg/m   Opioid Risk Score:   Fall Risk Score:  `1  Depression screen [facility] 2/9     07/31/2022    9:44 AM 01/29/2022   10:08 AM 11/27/2021    9:58 AM 09/05/2021    9:22 AM 07/01/2021    8:56 AM 03/07/2021   10:20 AM 08/16/2020    9:09 AM  Depression screen PHQ  2/9  Decreased Interest 0 0 0 0 0 0 0  Down, Depressed, Hopeless 0 0 0 0 0 0 0  PHQ - 2 Score 0 0 0 0 0 0 0     Review of Systems  Musculoskeletal:  Positive for back pain.       B/L leg foot LT arm LT shoulder abd region pain  All other systems reviewed and are negative.      Objective:   Physical Exam Vitals and nursing note reviewed.  Constitutional:      Appearance: Normal appearance.  Cardiovascular:     Rate and Rhythm: Normal rate and regular rhythm.     Pulses: Normal pulses.     Heart sounds: Normal heart sounds.  Pulmonary:     Effort: Pulmonary effort is normal.     Breath sounds: Normal breath sounds.  Musculoskeletal:     Cervical back: Normal range of motion and neck supple.     Comments: Normal Muscle Bulk and Muscle Testing Reveals:  Upper Extremities: Right : Full ROM and Muscle Strength 5/5 Left Upper Extremity: Decreased ROM 45 Degrees and Muscle Strength 5/5 Left AC Joint Tenderness  Lumbar Hypersensitivity Bilateral Greater Trochanter Tenderness Lower Extremities: Right Lower Extremity: Decreased ROM and Muscle Strength 5/5 Right Lower Extremity Flexion Produces Pain into her Lumbar and Right Hip Left Lower Extremity: Full ROM and Muscle Strength 5/5 Arises from Table slowly using cane for support Antalgic  Gait     Skin:    General: Skin is warm and dry.  Neurological:     Mental Status: He is alert and oriented to person, place, and time.  Psychiatric:        Mood and Affect: Mood normal.        Behavior: Behavior normal.         Assessment & Plan:  1. 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 12/04/2022 Refilled: Oxycodone 10 mg one tablet 5 times a day  as needed for pain #150.Second script sent for the following month to accommodate scheduled appointment.  We will continue the opioid monitoring program, this consists of  regular clinic visits, examinations, urine drug screen, pill counts as well as use of [place] Controlled Substance Reporting system. A 12 month History has been reviewed on the [facility] Controlled Substance Reporting System  12/04/2022.  2. Lumbar Radiculitis: Continue current medication regimen with Gabapentin. 12/04/2022 3. Intercostal neuralgia: Unable to continue Flector Patch due to cost, Continue current medication regimen with Voltaren Gel, Robaxin and Neurontin. 12/04/2022.  4. Insomnia: Continue :  trazodone. 12/04/2022 5. Muscle Spasm: Continue current medication regimen with Robaxin. 12/04/2022 6. Polyarthralgia: Continue current medication regime. Continue to Monitor. 12/04/2022 7. Chronic Bilateral Thoracic Pain: Continue HEP and Continue Current medication regimen. Continue to monitor. 12/04/2022 8. Chronic Right Hip Pain/ Avascular Necrosis: Mr. Locklar underwent TOTAL HIP ARTHROPLASTY by Donah Driver, MD Orthp following. 12/04/2022 9. Cervical Lymphadenopathy: Endocrinologist Following. 12/04/2022 S/P Radioactive Iodine Thyroid Ablation on 05/13/2020,Continue to monitor. 12/04/2022 10. Left Elbow Pain: No complaints today.  Ortho following. Continue to monitor.12/04/2022    F/U in 2 months

## 2023-02-05 ENCOUNTER — Encounter: Payer: Medicare HMO | Admitting: Registered Nurse

## 2023-02-11 ENCOUNTER — Other Ambulatory Visit: Payer: Self-pay | Admitting: "Endocrinology

## 2023-02-12 ENCOUNTER — Encounter: Payer: Medicare HMO | Admitting: Registered Nurse

## 2023-02-16 ENCOUNTER — Encounter: Payer: Medicare HMO | Attending: Physical Medicine & Rehabilitation | Admitting: Registered Nurse

## 2023-02-16 ENCOUNTER — Encounter: Payer: Self-pay | Admitting: Registered Nurse

## 2023-02-16 VITALS — BP 122/81 | HR 79 | Ht 69.0 in | Wt 256.2 lb

## 2023-02-16 DIAGNOSIS — M25512 Pain in left shoulder: Secondary | ICD-10-CM | POA: Insufficient documentation

## 2023-02-16 DIAGNOSIS — M2550 Pain in unspecified joint: Secondary | ICD-10-CM | POA: Diagnosis present

## 2023-02-16 DIAGNOSIS — Z5181 Encounter for therapeutic drug level monitoring: Secondary | ICD-10-CM | POA: Diagnosis present

## 2023-02-16 DIAGNOSIS — Z79891 Long term (current) use of opiate analgesic: Secondary | ICD-10-CM | POA: Diagnosis present

## 2023-02-16 DIAGNOSIS — M546 Pain in thoracic spine: Secondary | ICD-10-CM | POA: Insufficient documentation

## 2023-02-16 DIAGNOSIS — G8929 Other chronic pain: Secondary | ICD-10-CM | POA: Insufficient documentation

## 2023-02-16 DIAGNOSIS — G894 Chronic pain syndrome: Secondary | ICD-10-CM | POA: Diagnosis not present

## 2023-02-16 DIAGNOSIS — M519 Unspecified thoracic, thoracolumbar and lumbosacral intervertebral disc disorder: Secondary | ICD-10-CM | POA: Insufficient documentation

## 2023-02-16 MED ORDER — OXYCODONE HCL 10 MG PO TABS: 10.0000 mg | ORAL_TABLET | Freq: Every day | ORAL | 0 refills | Status: DC | PRN

## 2023-02-16 NOTE — Progress Notes (Signed)
Subjective:    Patient ID: Matthew Sloan, male    DOB: 1961/05/23, 62 y.o.   MRN: [id number]  HPI: Matthew Sloan is a 62 y.o. male who returns for follow up appointment for chronic pain and medication refill. He states his pain is located in his left shoulder, lower back pain and generalized joint pain. He rates his pain 9. His current exercise regime is walking, performing stretching exercises and weekly physical therapy.  Matthew Sloan is 75.00 MME.   Oral Swab was Performed today.     Pain Inventory Average Pain 9 Pain Right Now 9 My pain is constant, sharp, burning, stabbing, tingling, and aching  In the last 24 hours, has pain interfered with the following? General activity 10 Relation with others 8 Enjoyment of life 8 What TIME of day is your pain at its worst? morning , daytime, evening, and night Sleep (in general) Poor  Pain is worse with: walking, bending, sitting, inactivity, and standing Pain improves with: medication Relief from Meds: 3  Family History  Problem Relation Age of Onset  . Cancer Mother 32       breast cancer  . Heart disease Father 76       CHF   Social History   Socioeconomic History  . Marital status: Single    Spouse name: Not on file  . Number of children: Not on file  . Years of education: Not on file  . Highest education level: Not on file  Occupational History  . Not on file  Tobacco Use  . Smoking status: Every Day    Current packs/day: 1.00    Average packs/day: 1 pack/day for 30.0 years (30.0 ttl pk-yrs)    Types: Cigarettes  . Smokeless tobacco: Never  . Tobacco comments:    working on it-down to 0.5 ppd  Vaping Use  . Vaping status: Never Used  Substance and Sexual Activity  . Alcohol use: No  . Drug use: No  . Sexual activity: Not on file  Other Topics Concern  . Not on file  Social History Narrative  . Not on file   Social Determinants of Health   Financial Resource Strain: Not on file  Food  Insecurity: Not on file  Transportation Needs: Not on file  Physical Activity: Not on file  Stress: Not on file  Social Connections: Not on file   Past Surgical History:  Procedure Laterality Date  . bilateral tubes placed in ears  04/12/2012  . CARPAL TUNNEL RELEASE  12/15/2011   Procedure: CARPAL TUNNEL RELEASE;  Surgeon: [name], MD;  Location: [facility];  Service: Orthopedics;  Laterality: Left;  [name] MULTIPLE TOOTH EXTRACTIONS    . SHOULDER SURGERY  07/13/2010   lt  . 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 ULNAR TUNNEL RELEASE  12/15/2011   Procedure: CUBITAL TUNNEL RELEASE;  Surgeon: [name], MD;  Location: [facility];  Service: Orthopedics;  Laterality: Left;  left cubital tunnel release   Past Surgical History:  Procedure Laterality Date  . bilateral tubes placed in ears  04/12/2012  . CARPAL TUNNEL RELEASE  12/15/2011   Procedure: CARPAL TUNNEL RELEASE;  Surgeon: [name], MD;  Location: [facility];  Service: Orthopedics;  Laterality: Left;  [name] MULTIPLE TOOTH EXTRACTIONS    . SHOULDER SURGERY  07/13/2010   lt  . 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 ULNAR TUNNEL RELEASE  12/15/2011  Procedure: CUBITAL TUNNEL RELEASE;  Surgeon: [name], MD;  Location: [facility];  Service: Orthopedics;  Laterality: Left;  left cubital tunnel release   Past Medical History:  Diagnosis Date  . Allergy   . Chronic pain due to trauma   . Disturbance of skin sensation   . Intercostal neuralgia   . Lumbosacral neuritis   . Myocardial infarction (HCC)   . Rotator cuff (capsule) sprain   . Snores    BP 122/81   Pulse 79   Ht 5\' 9"  (1.753 m)   Wt 256 lb 3.2 oz (116.2 kg)   SpO2 93%   BMI 37.83 kg/m   Opioid Risk Score:   Fall Risk Score:  `1  Depression screen [facility] 2/9     02/16/2023   11:05 AM 12/04/2022   10:41 AM 07/31/2022    9:44 AM 01/29/2022   10:08 AM 11/27/2021    9:58 AM 09/05/2021    9:22  AM 07/01/2021    8:56 AM  Depression screen [facility] 2/9  Decreased Interest 0 0 0 0 0 0 0  Down, Depressed, Hopeless 0 0 0 0 0 0 0  PHQ - 2 Score 0 0 0 0 0 0 0     Review of Systems  Constitutional: Negative.   HENT: Negative.    Eyes: Negative.   Respiratory: Negative.    Cardiovascular: Negative.   Gastrointestinal: Negative.   Endocrine: Negative.   Genitourinary: Negative.   Musculoskeletal:  Positive for arthralgias, back pain and gait problem.  Skin: Negative.   Allergic/Immunologic: Negative.   Hematological:  Bruises/bleeds easily.       Apixaban  Psychiatric/Behavioral: Negative.    All other systems reviewed and are negative.      Objective:   Physical Exam Vitals and nursing note reviewed.  Constitutional:      Appearance: Normal appearance. He is obese.  Cardiovascular:     Rate and Rhythm: Normal rate and regular rhythm.     Pulses: Normal pulses.     Heart sounds: Normal heart sounds.  Pulmonary:     Effort: Pulmonary effort is normal.     Breath sounds: Normal breath sounds.  Musculoskeletal:     Cervical back: Normal range of motion and neck supple.     Comments: Normal Muscle Bulk and Muscle Testing Reveals:  Upper Extremities: Right: Full ROM and Muscle Strength 5/5 Left Upper Extremity: Decreased ROM: 45 Degrees and Muscle Strength 5/5 Left AC Joint Tenderness  Lumbar Hypersensitivity Lower Extremities: Right: Decreased ROM and Muscle Strength 5/5 Left: Full ROM and Muscle Strength 5/5 Arises from Table slowly Antalgic   Gait     Skin:    General: Skin is warm and dry.  Neurological:     Mental Status: He is alert and oriented to person, place, and time.  Psychiatric:        Mood and Affect: Mood normal.        Behavior: Behavior normal.         Assessment & Plan:  1. 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  02/16/2023 Refilled: Oxycodone 10 mg one tablet 5 times a day  as needed for pain #150.Second script sent for the following month to accommodate scheduled appointment.  We will continue the opioid monitoring program, this consists of regular clinic visits, examinations, urine drug screen, pill counts as well as use of [place] Controlled Substance Reporting system. A 12 month History has been reviewed on the [place] Controlled Substance Reporting System 02/16/2023.  2. Lumbar Radiculitis: Continue current medication regimen with Gabapentin. 02/16/2023 3. Intercostal neuralgia: Unable to continue Flector Patch due to cost, Continue current medication regimen with Voltaren Gel, Robaxin and Neurontin. 02/16/2023.  4. Insomnia: Continue :  trazodone. 02/16/2023 5. Muscle Spasm: Continue current medication regimen with Robaxin. 02/16/2023 6. Polyarthralgia: Continue current medication regime. Continue to Monitor. 02/16/2023 7. Chronic Bilateral Thoracic Pain: Continue HEP and Continue Current medication regimen. Continue to monitor. 02/16/2023 8. Chronic Right Hip Pain/ Avascular Necrosis: Matthew Sloan underwent TOTAL HIP ARTHROPLASTY by Donah Driver, MD Orthp following. 02/16/2023 9. Cervical Lymphadenopathy: Endocrinologist Following. 02/16/2023 S/P Radioactive Iodine Thyroid Ablation on 05/13/2020,Continue to monitor. 02/16/2023 10. Left Elbow Pain: No complaints today.  Ortho following. Continue to monitor.02/16/2023    F/U in 2 months : Due to Financial Hardship

## 2023-04-16 ENCOUNTER — Encounter: Payer: Medicare HMO | Admitting: Registered Nurse

## 2023-04-27 ENCOUNTER — Ambulatory Visit: Payer: Medicare HMO | Admitting: "Endocrinology

## 2023-04-29 NOTE — Progress Notes (Deleted)
Subjective:    Patient ID: Matthew Sloan, male    DOB: 09-22-1960, 62 y.o.   MRN: [id number]  HPI   Pain Inventory Average Pain {NUMBERS; 0-10:5044} Pain Right Now {NUMBERS; 0-10:5044} My pain is {PAIN DESCRIPTION:21022940}  In the last 24 hours, has pain interfered with the following? General activity {NUMBERS; 0-10:5044} Relation with others {NUMBERS; 0-10:5044} Enjoyment of life {NUMBERS; 0-10:5044} What TIME of day is your pain at its worst? {time of day:24191} Sleep (in general) {BHH GOOD/FAIR/POOR:22877}  Pain is worse with: {ACTIVITIES:21022942} Pain improves with: {PAIN IMPROVES WUJW:11914782} Relief from Meds: {NUMBERS; 0-10:5044}  Family History  Problem Relation Age of Onset   Cancer Mother 69       breast cancer   Heart disease Father 91       CHF   Social History   Socioeconomic History   Marital status: Single    Spouse name: Not on file   Number of children: Not on file   Years of education: Not on file   Highest education level: Not on file  Occupational History   Not on file  Tobacco Use   Smoking status: Every Day    Current packs/day: 1.00    Average packs/day: 1 pack/day for 30.0 years (30.0 ttl pk-yrs)    Types: Cigarettes   Smokeless tobacco: Never   Tobacco comments:    working on it-down to 0.5 ppd  Vaping Use   Vaping status: Never Used  Substance and Sexual Activity   Alcohol use: No   Drug use: No   Sexual activity: Not on file  Other Topics Concern   Not on file  Social History Narrative   Not on file   Social Determinants of Health   Financial Resource Strain: Not on file  Food Insecurity: Not on file  Transportation Needs: Not on file  Physical Activity: Not on file  Stress: Not on file  Social Connections: Not on file   Past Surgical History:  Procedure Laterality Date   bilateral tubes placed in ears  04/12/2012   CARPAL TUNNEL RELEASE  12/15/2011   Procedure: CARPAL TUNNEL RELEASE;  Surgeon: [name], MD;   Location: [facility];  Service: Orthopedics;  Laterality: Left;   MULTIPLE TOOTH EXTRACTIONS     SHOULDER SURGERY  07/13/2010   lt   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   ULNAR TUNNEL RELEASE  12/15/2011   Procedure: CUBITAL TUNNEL RELEASE;  Surgeon: [name], MD;  Location: [facility];  Service: Orthopedics;  Laterality: Left;  left cubital tunnel release   Past Surgical History:  Procedure Laterality Date   bilateral tubes placed in ears  04/12/2012   CARPAL TUNNEL RELEASE  12/15/2011   Procedure: CARPAL TUNNEL RELEASE;  Surgeon: [name], MD;  Location: [facility];  Service: Orthopedics;  Laterality: Left;   MULTIPLE TOOTH EXTRACTIONS     SHOULDER SURGERY  07/13/2010   lt   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   ULNAR TUNNEL RELEASE  12/15/2011   Procedure: CUBITAL TUNNEL RELEASE;  Surgeon: [name], MD;  Location: [facility];  Service: Orthopedics;  Laterality: Left;  left cubital tunnel release   Past Medical History:  Diagnosis Date   Allergy    Chronic pain due to trauma    Disturbance of skin sensation    Intercostal neuralgia    Lumbosacral neuritis    Myocardial infarction (HCC)    Rotator cuff (capsule) sprain  Snores    There were no vitals taken for this visit.  Opioid Risk Score:   Fall Risk Score:  `1  Depression screen [facility] 2/9     02/16/2023   11:05 AM 12/04/2022   10:41 AM 07/31/2022    9:44 AM 01/29/2022   10:08 AM 11/27/2021    9:58 AM 09/05/2021    9:22 AM 07/01/2021    8:56 AM  Depression screen PHQ 2/9  Decreased Interest 0 0 0 0 0 0 0  Down, Depressed, Hopeless 0 0 0 0 0 0 0  PHQ - 2 Score 0 0 0 0 0 0 0    Review of Systems     Objective:   Physical Exam        Assessment & Plan:

## 2023-04-30 ENCOUNTER — Encounter: Payer: Medicare HMO | Admitting: Registered Nurse

## 2023-05-07 ENCOUNTER — Encounter: Payer: Self-pay | Admitting: Registered Nurse

## 2023-05-07 ENCOUNTER — Encounter: Payer: Medicare HMO | Attending: Physical Medicine & Rehabilitation | Admitting: Registered Nurse

## 2023-05-07 VITALS — BP 126/72 | HR 91 | Ht 69.0 in | Wt 251.0 lb

## 2023-05-07 DIAGNOSIS — M8700 Idiopathic aseptic necrosis of unspecified bone: Secondary | ICD-10-CM | POA: Insufficient documentation

## 2023-05-07 DIAGNOSIS — M5416 Radiculopathy, lumbar region: Secondary | ICD-10-CM | POA: Diagnosis not present

## 2023-05-07 DIAGNOSIS — M25512 Pain in left shoulder: Secondary | ICD-10-CM | POA: Diagnosis not present

## 2023-05-07 DIAGNOSIS — G4709 Other insomnia: Secondary | ICD-10-CM | POA: Diagnosis present

## 2023-05-07 DIAGNOSIS — G8929 Other chronic pain: Secondary | ICD-10-CM | POA: Diagnosis present

## 2023-05-07 DIAGNOSIS — M546 Pain in thoracic spine: Secondary | ICD-10-CM | POA: Diagnosis not present

## 2023-05-07 DIAGNOSIS — G894 Chronic pain syndrome: Secondary | ICD-10-CM | POA: Insufficient documentation

## 2023-05-07 DIAGNOSIS — Z5181 Encounter for therapeutic drug level monitoring: Secondary | ICD-10-CM | POA: Insufficient documentation

## 2023-05-07 DIAGNOSIS — Z79891 Long term (current) use of opiate analgesic: Secondary | ICD-10-CM | POA: Insufficient documentation

## 2023-05-07 DIAGNOSIS — M2550 Pain in unspecified joint: Secondary | ICD-10-CM | POA: Insufficient documentation

## 2023-05-07 DIAGNOSIS — M519 Unspecified thoracic, thoracolumbar and lumbosacral intervertebral disc disorder: Secondary | ICD-10-CM | POA: Insufficient documentation

## 2023-05-07 MED ORDER — OXYCODONE HCL 10 MG PO TABS: 10.0000 mg | ORAL_TABLET | Freq: Every day | ORAL | 0 refills | Status: DC | PRN

## 2023-05-07 NOTE — Progress Notes (Signed)
Subjective:    Patient ID: Matthew Sloan, male    DOB: 10-10-1960, 62 y.o.   MRN: [id number]  HPI: Matthew Sloan is a 62 y.o. male who returns for follow up appointment for chronic pain and medication refill. He states his  pain is located in his left shoulder, mid- lower back pain radiating into his bilateral lower extremities, right hip pain and generalized pain. He rates his pain 9. His current exercise regime is walking and performing stretching exercises.  Matthew Sloan Morphine equivalent is 75.00 MME.   Last Oral Swab was Performed on 02/16/2023, it was consistent.     Pain Inventory Average Pain 9 Pain Right Now 9 My pain is constant, sharp, burning, stabbing, tingling, and aching  In the last 24 hours, has pain interfered with the following? General activity 10 Relation with others 8 Enjoyment of life 8 What TIME of day is your pain at its worst? morning , daytime, evening, and night Sleep (in general) Poor  Pain is worse with: walking, bending, sitting, and standing Pain improves with: medication Relief from Meds: 2  Family History  Problem Relation Age of Onset   Cancer Mother 4       breast cancer   Heart disease Father 63       CHF   Social History   Socioeconomic History   Marital status: Single    Spouse name: Not on file   Number of children: Not on file   Years of education: Not on file   Highest education level: Not on file  Occupational History   Not on file  Tobacco Use   Smoking status: Every Day    Current packs/day: 1.00    Average packs/day: 1 pack/day for 30.0 years (30.0 ttl pk-yrs)    Types: Cigarettes   Smokeless tobacco: Never   Tobacco comments:    working on it-down to 0.5 ppd  Vaping Use   Vaping status: Never Used  Substance and Sexual Activity   Alcohol use: No   Drug use: No   Sexual activity: Not on file  Other Topics Concern   Not on file  Social History Narrative   Not on file   Social Determinants of Health    Financial Resource Strain: Not on file  Food Insecurity: Not on file  Transportation Needs: Not on file  Physical Activity: Not on file  Stress: Not on file  Social Connections: Not on file   Past Surgical History:  Procedure Laterality Date   bilateral tubes placed in ears  04/12/2012   CARPAL TUNNEL RELEASE  12/15/2011   Procedure: CARPAL TUNNEL RELEASE;  Surgeon: [name], MD;  Location: [facility];  Service: Orthopedics;  Laterality: Left;   MULTIPLE TOOTH EXTRACTIONS     SHOULDER SURGERY  07/13/2010   lt   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   ULNAR TUNNEL RELEASE  12/15/2011   Procedure: CUBITAL TUNNEL RELEASE;  Surgeon: [name], MD;  Location: [facility];  Service: Orthopedics;  Laterality: Left;  left cubital tunnel release   Past Surgical History:  Procedure Laterality Date   bilateral tubes placed in ears  04/12/2012   CARPAL TUNNEL RELEASE  12/15/2011   Procedure: CARPAL TUNNEL RELEASE;  Surgeon: [name], MD;  Location: [facility];  Service: Orthopedics;  Laterality: Left;   MULTIPLE TOOTH EXTRACTIONS     SHOULDER SURGERY  07/13/2010   lt   TOTAL HIP ARTHROPLASTY  Right 08/2022   ULNAR TUNNEL RELEASE  12/15/2011   Procedure: CUBITAL TUNNEL RELEASE;  Surgeon: [name], MD;  Location: [facility];  Service: Orthopedics;  Laterality: Left;  left cubital tunnel release   Past Medical History:  Diagnosis Date   Allergy    Chronic pain due to trauma    Disturbance of skin sensation    Intercostal neuralgia    Lumbosacral neuritis    Myocardial infarction (HCC)    Rotator cuff (capsule) sprain    Snores    BP 126/72   Pulse 91   Ht 5\' 9"  (1.753 m)   Wt 251 lb (113.9 kg)   SpO2 95%   BMI 37.07 kg/m   Opioid Risk Score:   Fall Risk Score:  `1  Dep[facility] screen [facility] 2/9     02/16/2023   11:05 AM 12/04/2022   10:41 AM 07/31/2022    9:44 AM 01/29/2022   10:08 AM 11/27/2021     9:58 AM 09/05/2021    9:22 AM 07/01/2021    8:56 AM  Depression screen PHQ 2/9  Decreased Interest 0 0 0 0 0 0 0  Down, Depressed, Hopeless 0 0 0 0 0 0 0  PHQ - 2 Score 0 0 0 0 0 0 0     Review of Systems  Musculoskeletal:  Positive for back pain, gait problem and myalgias.  All other systems reviewed and are negative.     Objective:   Physical Exam Vitals and nursing note reviewed.  Constitutional:      Appearance: Normal appearance. He is obese.  Cardiovascular:     Rate and Rhythm: Normal rate and regular rhythm.     Pulses: Normal pulses.     Heart sounds: Normal heart sounds.  Pulmonary:     Effort: Pulmonary effort is normal.     Breath sounds: Normal breath sounds.  Musculoskeletal:     Cervical back: Normal range of motion and neck supple.     Comments: Normal Muscle Bulk and Muscle Testing Reveals:  Upper Extremities: Right: Full ROM and Muscle Strength 5/5 Left Upper Extremity: Decreased ROM 30 Degrees and Muscle Strength 5/5 Left AC Joint Tenderness Lumbar Hypersensitivity Lower Extremities: Right: Decreased ROM and Muscle Strength 5/5 Right Lower Extremity Flexion Produces Pain into her Right Lower Extremity  Left Lower Extremity : Full ROM and Muscle Strength 5/5 Arises from Chair slowly  Antalgic  Gait     Skin:    General: Skin is warm and dry.  Neurological:     Mental Status: He is alert and oriented to person, place, and time.  Psychiatric:        Mood and Affect: Mood normal.        Behavior: Behavior normal.         Assessment & Plan:  1. 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 05/07/2023 Refilled: Oxycodone 10 mg one tablet 5 times a day  as needed for pain #150.We will continue the opioid monitoring program, this consists of regular clinic visits, examinations, urine drug screen, pill counts as well as use of [place]  Controlled Substance Reporting system. A 12 month History has been reviewed on the [place] Controlled Substance Reporting System 05/07/2023.  2. Lumbar Radiculitis: Continue current medication regimen with Gabapentin. 05/07/2023 3. Intercostal neuralgia: Unable to continue Flector Patch due to cost, Continue current medication regimen with Voltaren  Gel, Robaxin and Neurontin. 05/07/2023.  4. Insomnia: Continue :  trazodone. 05/07/2023 5. Muscle Spasm: Continue current medication regimen with Robaxin. 05/07/2023 6. Polyarthralgia: Continue current medication regime. Continue to Monitor. 05/07/2023 7. Chronic Bilateral Thoracic Pain: Continue HEP and Continue Current medication regimen. Continue to monitor. 05/07/2023 8. Chronic Right Hip Pain/ Avascular Necrosis: Mr. Hieb underwent TOTAL HIP ARTHROPLASTY by Donah Driver, MD Ortho following. 05/07/2023 9. Cervical Lymphadenopathy: Endocrinologist Following. 05/07/2023 S/P Radioactive Iodine Thyroid Ablation on 05/13/2020,Continue to monitor. 05/07/2023 10. Left Elbow Pain: No complaints today.  Ortho following. Continue to monitor.05/07/2023    F/U in 2 months : Due to Financial Hardship

## 2023-05-27 ENCOUNTER — Other Ambulatory Visit: Payer: Self-pay | Admitting: "Endocrinology

## 2023-05-31 ENCOUNTER — Other Ambulatory Visit: Payer: Self-pay

## 2023-05-31 MED ORDER — OXYCODONE HCL 10 MG PO TABS: 10.0000 mg | ORAL_TABLET | Freq: Every day | ORAL | 0 refills | Status: DC | PRN

## 2023-05-31 NOTE — Telephone Encounter (Signed)
PMP was Reviewed.  Oxycodone e-scribed to pharmacy.  Call Placed to Mr. Portwood, no answer. Left voicemail for Mr. Mcevilly to call with any questions.

## 2023-07-09 ENCOUNTER — Encounter: Payer: Medicare HMO | Admitting: Registered Nurse

## 2023-07-16 ENCOUNTER — Encounter: Payer: Medicare HMO | Admitting: Registered Nurse

## 2023-07-21 ENCOUNTER — Encounter: Payer: Medicare HMO | Admitting: Registered Nurse

## 2023-07-23 ENCOUNTER — Encounter: Payer: Medicare HMO | Attending: Physical Medicine & Rehabilitation | Admitting: Registered Nurse

## 2023-07-23 ENCOUNTER — Encounter: Payer: Self-pay | Admitting: Registered Nurse

## 2023-07-23 VITALS — BP 136/82 | HR 89 | Ht 69.0 in | Wt 260.0 lb

## 2023-07-23 DIAGNOSIS — Z5181 Encounter for therapeutic drug level monitoring: Secondary | ICD-10-CM | POA: Insufficient documentation

## 2023-07-23 DIAGNOSIS — G4709 Other insomnia: Secondary | ICD-10-CM | POA: Diagnosis present

## 2023-07-23 DIAGNOSIS — M546 Pain in thoracic spine: Secondary | ICD-10-CM | POA: Diagnosis present

## 2023-07-23 DIAGNOSIS — M2550 Pain in unspecified joint: Secondary | ICD-10-CM | POA: Insufficient documentation

## 2023-07-23 DIAGNOSIS — Z79891 Long term (current) use of opiate analgesic: Secondary | ICD-10-CM | POA: Diagnosis present

## 2023-07-23 DIAGNOSIS — M5416 Radiculopathy, lumbar region: Secondary | ICD-10-CM | POA: Insufficient documentation

## 2023-07-23 DIAGNOSIS — M25512 Pain in left shoulder: Secondary | ICD-10-CM | POA: Insufficient documentation

## 2023-07-23 DIAGNOSIS — G8929 Other chronic pain: Secondary | ICD-10-CM | POA: Diagnosis present

## 2023-07-23 DIAGNOSIS — M519 Unspecified thoracic, thoracolumbar and lumbosacral intervertebral disc disorder: Secondary | ICD-10-CM | POA: Insufficient documentation

## 2023-07-23 DIAGNOSIS — G894 Chronic pain syndrome: Secondary | ICD-10-CM | POA: Diagnosis present

## 2023-07-23 MED ORDER — OXYCODONE HCL 10 MG PO TABS: 10.0000 mg | ORAL_TABLET | Freq: Every day | ORAL | 0 refills | Status: DC | PRN

## 2023-07-23 NOTE — Progress Notes (Signed)
 Subjective:    Patient ID: Matthew Sloan, male    DOB: Oct 13, 1960, 63 y.o.   MRN: [id number]  HPI: BABY [facility] is a 63 y.o. male who returns for follow up appointment for chronic pain and medication refill. He states his pain is located in his left shoulder, mid- lower back radiating into his bilateral lower extremities. He rates his pain 9. His current exercise regime is walking and performing stretching exercises.  Mr. Payes Morphine equivalent is 75.00 MME.   Oral Swab was Performed today.     Pain Inventory Average Pain 8 Pain Right Now 9 My pain is constant, sharp, burning, stabbing, tingling, and aching  In the last 24 hours, has pain interfered with the following? General activity 10 Relation with others 8 Enjoyment of life 8 What TIME of day is your pain at its worst? morning , daytime, evening, and night Sleep (in general) Poor  Pain is worse with: walking, bending, sitting, inactivity, standing, and some activites Pain improves with: rest and medication Relief from Meds: 2  Family History  Problem Relation Age of Onset   Cancer Mother 6       breast cancer   Heart disease Father 63       CHF   Social History   Socioeconomic History   Marital status: Single    Spouse name: Not on file   Number of children: Not on file   Years of education: Not on file   Highest education level: Not on file  Occupational History   Not on file  Tobacco Use   Smoking status: Every Day    Current packs/day: 1.00    Average packs/day: 1 pack/day for 30.0 years (30.0 ttl pk-yrs)    Types: Cigarettes   Smokeless tobacco: Never   Tobacco comments:    working on it-down to 0.5 ppd  Vaping Use   Vaping status: Never Used  Substance and Sexual Activity   Alcohol  use: No   Drug use: No   Sexual activity: Not on file  Other Topics Concern   Not on file  Social History Narrative   Not on file   Social Drivers of Health   Financial Resource Strain: Not on file  Food  Insecurity: Not on file  Transportation Needs: Not on file  Physical Activity: Not on file  Stress: Not on file  Social Connections: Not on file   Past Surgical History:  Procedure Laterality Date   bilateral tubes placed in ears  04/12/2012   CARPAL TUNNEL RELEASE  12/15/2011   Procedure: CARPAL TUNNEL RELEASE;  Surgeon: Franky JONELLE Curia, MD;  Location: [facility];  Service: Orthopedics;  Laterality: Left;   MULTIPLE TOOTH EXTRACTIONS     SHOULDER SURGERY  07/13/2010   lt   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   ULNAR TUNNEL RELEASE  12/15/2011   Procedure: CUBITAL TUNNEL RELEASE;  Surgeon: Franky JONELLE Curia, MD;  Location: [facility];  Service: Orthopedics;  Laterality: Left;  left cubital tunnel release   Past Surgical History:  Procedure Laterality Date   bilateral tubes placed in ears  04/12/2012   CARPAL TUNNEL RELEASE  12/15/2011   Procedure: CARPAL TUNNEL RELEASE;  Surgeon: Franky JONELLE Curia, MD;  Location: [facility];  Service: Orthopedics;  Laterality: Left;   MULTIPLE TOOTH EXTRACTIONS     SHOULDER SURGERY  07/13/2010   lt   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   ULNAR TUNNEL RELEASE  12/15/2011   Procedure: CUBITAL TUNNEL RELEASE;  Surgeon: Franky JONELLE Curia, MD;  Location: [facility];  Service: Orthopedics;  Laterality: Left;  left cubital tunnel release   Past Medical History:  Diagnosis Date   Allergy    Chronic pain due to trauma    Disturbance of skin sensation    Intercostal neuralgia    Lumbosacral neuritis    Myocardial infarction (HCC)    Rotator cuff (capsule) sprain    Snores    BP 136/82   Pulse 89   Ht 5' 9 (1.753 m)   Wt 260 lb (117.9 kg)   SpO2 91%   BMI 38.40 kg/m   Opioid Risk Score:   Fall Risk Score:  `1  Depression screen [facility] 2/9     07/23/2023   10:58 AM 02/16/2023   11:05 AM 12/04/2022   10:41 AM 07/31/2022    9:44 AM 01/29/2022   10:08 AM 11/27/2021    9:58 AM 09/05/2021    9:22 AM   Depression screen PHQ 2/9  Decreased Interest 0 0 0 0 0 0 0  Down, Depressed, Hopeless 0 0 0 0 0 0 0  PHQ - 2 Score 0 0 0 0 0 0 0     Review of Systems  Musculoskeletal:  Positive for back pain.  All other systems reviewed and are negative.      Objective:   Physical Exam Vitals and nursing note reviewed.  Constitutional:      Appearance: Normal appearance.  Cardiovascular:     Rate and Rhythm: Normal rate and regular rhythm.     Pulses: Normal pulses.     Heart sounds: Normal heart sounds.  Pulmonary:     Effort: Pulmonary effort is normal.     Breath sounds: Normal breath sounds.  Musculoskeletal:     Comments: Normal Muscle Bulk and Muscle Testing Reveals:  Upper Extremities: Right: Full ROM and Muscle Strength 5/5 Left Upper Extremity: Decreased ROM 45 Degrees and Muscle Strength 5/5 Left AC Joint Tenderness  Thoracic Paraspinal Tenderness: T-4-T-6 Lumbar Hypersensitivity Lower Extremities: Decreased ROM and Muscle Strength 5/5 Bilateral Lower Extremities Flexion Produces Pain into his Lumbar and Bilateral Hips Arises from Table slowly Antalgic Gait     Skin:    General: Skin is warm and dry.  Neurological:     Mental Status: He is alert and oriented to person, place, and time.  Psychiatric:        Mood and Affect: Mood normal.        Behavior: Behavior normal.         Assessment & Plan:  1. 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 07/23/2023 Refilled: Oxycodone  10 mg one tablet 5 times a day  as needed for pain #150.We will continue the opioid monitoring program, this consists of regular clinic visits, examinations, urine drug screen, pill counts as well as use of [place]  Controlled Substance Reporting system. A 12 month History has been reviewed on the [place]  Controlled Substance Reporting System 07/23/2023.  2. Lumbar  Radiculitis: Continue current medication regimen with Gabapentin . 07/23/2023 3. Intercostal neuralgia: Unable to continue Flector  Patch due to cost, Continue current medication regimen with Voltaren  Gel, Robaxin  and Neurontin . 07/23/2023.  4. Insomnia: Continue :  trazodone . 07/23/2023 5. Muscle Spasm: Continue current medication regimen with Robaxin . 07/23/2023 6. Polyarthralgia: Continue current medication regime. Continue to Monitor. 07/23/2023 7. Chronic Bilateral  Thoracic Pain: Continue HEP and Continue Current medication regimen. Continue to monitor. 07/23/2023 8. Chronic Right Hip Pain/ Avascular Necrosis: Mr. Girouard underwent TOTAL HIP ARTHROPLASTY by Jackson Coy, MD Ortho following. 07/23/2023 9. Cervical Lymphadenopathy: Endocrinologist Following. 07/23/2023 S/P Radioactive Iodine Thyroid Ablation on 05/13/2020,Continue to monitor. 07/23/2023 10. Left Elbow Pain: No complaints today.  Ortho following. Continue to monitor.07/23/2023    F/U in 2 months : Due to Financial Hardship

## 2023-07-28 LAB — DRUG TOX MONITOR 1 W/CONF, ORAL FLD
Amphetamines: NEGATIVE ng/mL (ref <10)
Barbiturates: NEGATIVE ng/mL (ref <10)
Benzodiazepines: NEGATIVE ng/mL (ref <0.50)
Buprenorphine: NEGATIVE ng/mL (ref <0.10)
Cocaine: NEGATIVE ng/mL (ref <5.0)
Codeine: NEGATIVE ng/mL (ref <2.5)
Cotinine: >250 ng/mL — ABNORMAL HIGH (ref <5.0)
Dihydrocodeine: NEGATIVE ng/mL (ref <2.5)
Fentanyl: NEGATIVE ng/mL (ref <0.10)
Heroin Metabolite: NEGATIVE ng/mL (ref <1.0)
Hydrocodone: NEGATIVE ng/mL (ref <2.5)
Hydromorphone: NEGATIVE ng/mL (ref <2.5)
MARIJUANA: NEGATIVE ng/mL (ref <2.5)
MDMA: NEGATIVE ng/mL (ref <10)
Meprobamate: NEGATIVE ng/mL (ref <2.5)
Methadone: NEGATIVE ng/mL (ref <5.0)
Morphine: NEGATIVE ng/mL (ref <2.5)
Nicotine Metabolite: POSITIVE ng/mL — AB (ref <5.0)
Norhydrocodone: NEGATIVE ng/mL (ref <2.5)
Noroxycodone: 40.9 ng/mL — ABNORMAL HIGH (ref <2.5)
Opiates: POSITIVE ng/mL — AB (ref <2.5)
Oxycodone: >250 ng/mL — ABNORMAL HIGH (ref <2.5)
Oxymorphone: 4.5 ng/mL — ABNORMAL HIGH (ref <2.5)
Phencyclidine: NEGATIVE ng/mL (ref <10)
Tapentadol: NEGATIVE ng/mL (ref <5.0)
Tramadol: NEGATIVE ng/mL (ref <5.0)
Zolpidem: NEGATIVE ng/mL (ref <5.0)

## 2023-07-28 LAB — DRUG TOX ALC METAB W/CON, ORAL FLD: Alcohol Metabolite: NEGATIVE ng/mL (ref <25)

## 2023-08-24 ENCOUNTER — Ambulatory Visit: Payer: Medicare HMO | Admitting: "Endocrinology

## 2023-09-02 ENCOUNTER — Ambulatory Visit: Payer: Medicare HMO | Admitting: "Endocrinology

## 2023-09-15 ENCOUNTER — Encounter: Payer: Medicare HMO | Admitting: Registered Nurse

## 2023-09-21 ENCOUNTER — Encounter: Admitting: Registered Nurse

## 2023-09-22 ENCOUNTER — Telehealth: Payer: Self-pay | Admitting: "Endocrinology

## 2023-09-22 DIAGNOSIS — E890 Postprocedural hypothyroidism: Secondary | ICD-10-CM

## 2023-09-22 DIAGNOSIS — Z91199 Patient's noncompliance with other medical treatment and regimen due to unspecified reason: Secondary | ICD-10-CM

## 2023-09-22 DIAGNOSIS — E119 Type 2 diabetes mellitus without complications: Secondary | ICD-10-CM

## 2023-09-22 NOTE — Telephone Encounter (Signed)
 Pt needs labs updated, will be going beginning of next week

## 2023-09-22 NOTE — Telephone Encounter (Signed)
Lab orders updated and sent to [place].

## 2023-09-23 ENCOUNTER — Ambulatory Visit: Payer: Medicare HMO | Admitting: "Endocrinology

## 2023-09-23 NOTE — Progress Notes (Deleted)
 Subjective:    Patient ID: Matthew Sloan, male    DOB: 04-Apr-1961, 63 y.o.   MRN: [id number]  HPI   Pain Inventory Average Pain {NUMBERS; 0-10:5044} Pain Right Now {NUMBERS; 0-10:5044} My pain is {PAIN DESCRIPTION:21022940}  In the last 24 hours, has pain interfered with the following? General activity {NUMBERS; 0-10:5044} Relation with others {NUMBERS; 0-10:5044} Enjoyment of life {NUMBERS; 0-10:5044} What TIME of day is your pain at its worst? {time of day:24191} Sleep (in general) {BHH GOOD/FAIR/POOR:22877}  Pain is worse with: {ACTIVITIES:21022942} Pain improves with: {PAIN IMPROVES VHQI:69629528} Relief from Meds: {NUMBERS; 0-10:5044}  Family History  Problem Relation Age of Onset   Cancer Mother 73       breast cancer   Heart disease Father 25       CHF   Social History   Socioeconomic History   Marital status: Single    Spouse name: Not on file   Number of children: Not on file   Years of education: Not on file   Highest education level: Not on file  Occupational History   Not on file  Tobacco Use   Smoking status: Every Day    Current packs/day: 1.00    Average packs/day: 1 pack/day for 30.0 years (30.0 ttl pk-yrs)    Types: Cigarettes   Smokeless tobacco: Never   Tobacco comments:    working on it-down to 0.5 ppd  Vaping Use   Vaping status: Never Used  Substance and Sexual Activity   Alcohol use: No   Drug use: No   Sexual activity: Not on file  Other Topics Concern   Not on file  Social History Narrative   Not on file   Social Drivers of Health   Financial Resource Strain: Not on file  Food Insecurity: Not on file  Transportation Needs: Not on file  Physical Activity: Not on file  Stress: Not on file  Social Connections: Not on file   Past Surgical History:  Procedure Laterality Date   bilateral tubes placed in ears  04/12/2012   CARPAL TUNNEL RELEASE  12/15/2011   Procedure: CARPAL TUNNEL RELEASE;  Surgeon: [name], MD;   Location: [facility];  Service: Orthopedics;  Laterality: Left;   MULTIPLE TOOTH EXTRACTIONS     SHOULDER SURGERY  07/13/2010   lt   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   ULNAR TUNNEL RELEASE  12/15/2011   Procedure: CUBITAL TUNNEL RELEASE;  Surgeon: [name], MD;  Location: [facility];  Service: Orthopedics;  Laterality: Left;  left cubital tunnel release   Past Surgical History:  Procedure Laterality Date   bilateral tubes placed in ears  04/12/2012   CARPAL TUNNEL RELEASE  12/15/2011   Procedure: CARPAL TUNNEL RELEASE;  Surgeon: [name], MD;  Location: [facility];  Service: Orthopedics;  Laterality: Left;   MULTIPLE TOOTH EXTRACTIONS     SHOULDER SURGERY  07/13/2010   lt   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   ULNAR TUNNEL RELEASE  12/15/2011   Procedure: CUBITAL TUNNEL RELEASE;  Surgeon: [name], MD;  Location: [facility];  Service: Orthopedics;  Laterality: Left;  left cubital tunnel release   Past Medical History:  Diagnosis Date   Allergy    Chronic pain due to trauma    Disturbance of skin sensation    Intercostal neuralgia    Lumbosacral neuritis    Myocardial infarction (HCC)    Rotator cuff (capsule) sprain  Snores    There were no vitals taken for this visit.  Opioid Risk Score:   Fall Risk Score:  `1  Depression screen [facility] 2/9     07/23/2023   10:58 AM 02/16/2023   11:05 AM 12/04/2022   10:41 AM 07/31/2022    9:44 AM 01/29/2022   10:08 AM 11/27/2021    9:58 AM 09/05/2021    9:22 AM  Depression screen PHQ 2/9  Decreased Interest 0 0 0 0 0 0 0  Down, Depressed, Hopeless 0 0 0 0 0 0 0  PHQ - 2 Score 0 0 0 0 0 0 0    Review of Systems     Objective:   Physical Exam        Assessment & Plan:

## 2023-09-24 ENCOUNTER — Telehealth: Payer: Self-pay | Admitting: Registered Nurse

## 2023-09-24 ENCOUNTER — Encounter: Admitting: Registered Nurse

## 2023-09-24 MED ORDER — OXYCODONE HCL 10 MG PO TABS: 10.0000 mg | ORAL_TABLET | Freq: Every day | ORAL | 0 refills | Status: DC | PRN

## 2023-09-24 NOTE — Telephone Encounter (Signed)
 Please notify patient once prescription is sent to pharmacy

## 2023-09-24 NOTE — Telephone Encounter (Signed)
 PMP was Reviewed Oxycodone e-scribed to pharmacy.  Call placed to Mr. Bowker regarding the above and he verbalizes understanding.

## 2023-09-24 NOTE — Telephone Encounter (Signed)
 Patient needs Oxycodone refill sent in to [facility] in [place]

## 2023-09-28 ENCOUNTER — Ambulatory Visit: Admitting: Registered Nurse

## 2023-10-13 ENCOUNTER — Ambulatory Visit: Admitting: "Endocrinology

## 2023-10-13 ENCOUNTER — Encounter: Admitting: Registered Nurse

## 2023-10-27 ENCOUNTER — Ambulatory Visit: Admitting: "Endocrinology

## 2023-11-01 ENCOUNTER — Encounter: Admitting: Registered Nurse

## 2023-11-05 ENCOUNTER — Encounter: Admitting: Registered Nurse

## 2023-11-09 ENCOUNTER — Encounter: Attending: Registered Nurse | Admitting: Registered Nurse

## 2023-11-09 ENCOUNTER — Encounter: Payer: Self-pay | Admitting: Registered Nurse

## 2023-11-09 VITALS — BP 122/77 | HR 80 | Ht 69.0 in | Wt 262.0 lb

## 2023-11-09 DIAGNOSIS — G8929 Other chronic pain: Secondary | ICD-10-CM | POA: Diagnosis present

## 2023-11-09 DIAGNOSIS — Z79891 Long term (current) use of opiate analgesic: Secondary | ICD-10-CM | POA: Diagnosis present

## 2023-11-09 DIAGNOSIS — M546 Pain in thoracic spine: Secondary | ICD-10-CM | POA: Diagnosis present

## 2023-11-09 DIAGNOSIS — M519 Unspecified thoracic, thoracolumbar and lumbosacral intervertebral disc disorder: Secondary | ICD-10-CM | POA: Diagnosis present

## 2023-11-09 DIAGNOSIS — G894 Chronic pain syndrome: Secondary | ICD-10-CM | POA: Diagnosis present

## 2023-11-09 DIAGNOSIS — M8700 Idiopathic aseptic necrosis of unspecified bone: Secondary | ICD-10-CM

## 2023-11-09 DIAGNOSIS — M2550 Pain in unspecified joint: Secondary | ICD-10-CM | POA: Diagnosis present

## 2023-11-09 DIAGNOSIS — M25512 Pain in left shoulder: Secondary | ICD-10-CM | POA: Insufficient documentation

## 2023-11-09 DIAGNOSIS — Z5181 Encounter for therapeutic drug level monitoring: Secondary | ICD-10-CM | POA: Diagnosis present

## 2023-11-09 DIAGNOSIS — M5416 Radiculopathy, lumbar region: Secondary | ICD-10-CM

## 2023-11-09 MED ORDER — DICLOFENAC EPOLAMINE 1.3 % EX PTCH
1.0000 | MEDICATED_PATCH | Freq: Two times a day (BID) | CUTANEOUS | 1 refills | Status: AC
Start: 2023-11-09 — End: ?

## 2023-11-09 MED ORDER — TRAZODONE HCL 50 MG PO TABS: 50.0000 mg | ORAL_TABLET | Freq: Every day | ORAL | 2 refills | Status: DC

## 2023-11-09 MED ORDER — OXYCODONE HCL 10 MG PO TABS
10.0000 mg | ORAL_TABLET | Freq: Every day | ORAL | 0 refills | Status: DC | PRN
Start: 2023-11-09 — End: 2023-11-09

## 2023-11-09 MED ORDER — OXYCODONE HCL 10 MG PO TABS
10.0000 mg | ORAL_TABLET | Freq: Every day | ORAL | 0 refills | Status: DC | PRN
Start: 2023-11-09 — End: 2024-01-17

## 2023-11-09 NOTE — Progress Notes (Signed)
 Subjective:    Patient ID: Matthew Sloan, male    DOB: Apr 16, 1961, 63 y.o.   MRN: [id number]  HPI: Matthew Sloan is a 63 y.o. male who returns for follow up appointment for chronic pain and medication refill. He states his  pain is located in his left shoulder, mid- lower back radiating into his bilateral lower extremities and bilateral feet with tingling and burning. He  rates his pain 9. His current exercise regime is walking and performing stretching exercises.  [name] Morphine equivalent is 75.00 MME.   Last Oral Swab was Performed on 07/23/2023, it was consistent.     Pain Inventory Average Pain 9 Pain Right Now 9 My pain is constant, sharp, burning, stabbing, tingling, and aching  In the last 24 hours, has pain interfered with the following? General activity 10 Relation with others 8 Enjoyment of life 8 What TIME of day is your pain at its worst? morning , daytime, evening, and night Sleep (in general) Poor  Pain is worse with: walking, bending, sitting, inactivity, and standing Pain improves with: medication Relief from Meds: 2  Family History  Problem Relation Age of Onset   Cancer Mother 43       breast cancer   Heart disease Father 67       CHF   Social History   Socioeconomic History   Marital status: Single    Spouse name: Not on file   Number of children: Not on file   Years of education: Not on file   Highest education level: Not on file  Occupational History   Not on file  Tobacco Use   Smoking status: Every Day    Current packs/day: 1.00    Average packs/day: 1 pack/day for 30.0 years (30.0 ttl pk-yrs)    Types: Cigarettes   Smokeless tobacco: Never   Tobacco comments:    working on it-down to 0.5 ppd  Vaping Use   Vaping status: Never Used  Substance and Sexual Activity   Alcohol  use: No   Drug use: No   Sexual activity: Not on file  Other Topics Concern   Not on file  Social History Narrative   Not on file   Social Drivers of  Health   Financial Resource Strain: Not on file  Food Insecurity: Not on file  Transportation Needs: Not on file  Physical Activity: Not on file  Stress: Not on file  Social Connections: Not on file   Past Surgical History:  Procedure Laterality Date   bilateral tubes placed in ears  04/12/2012   CARPAL TUNNEL RELEASE  12/15/2011   Procedure: CARPAL TUNNEL RELEASE;  Surgeon: Milagros Alf, MD;  Location: [facility];  Service: Orthopedics;  Laterality: Left;   MULTIPLE TOOTH EXTRACTIONS     SHOULDER SURGERY  07/13/2010   lt   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   ULNAR TUNNEL RELEASE  12/15/2011   Procedure: CUBITAL TUNNEL RELEASE;  Surgeon: Milagros Alf, MD;  Location: [facility];  Service: Orthopedics;  Laterality: Left;  left cubital tunnel release   Past Surgical History:  Procedure Laterality Date   bilateral tubes placed in ears  04/12/2012   CARPAL TUNNEL RELEASE  12/15/2011   Procedure: CARPAL TUNNEL RELEASE;  Surgeon: Milagros Alf, MD;  Location: [facility];  Service: Orthopedics;  Laterality: Left;   MULTIPLE TOOTH EXTRACTIONS     SHOULDER SURGERY  07/13/2010   lt   TOTAL  HIP ARTHROPLASTY Right 08/2022   ULNAR TUNNEL RELEASE  12/15/2011   Procedure: CUBITAL TUNNEL RELEASE;  Surgeon: Milagros Alf, MD;  [facility];  Service: Orthopedics;  Laterality: Left;  left cubital tunnel release   Past Medical History:  Diagnosis Date   Allergy    Chronic pain due to trauma    Disturbance of skin sensation    Intercostal neuralgia    Lumbosacral neuritis    Myocardial infarction (HCC)    Rotator cuff (capsule) sprain    Snores    BP 122/77   Pulse 80   Ht 5\' 9"  (1.753 m)   Wt 262 lb (118.8 kg)   SpO2 93%   BMI 38.69 kg/m   Opioid Risk Score:   Fall Risk Score:  `1  Depression screen [facility] 2/9     11/09/2023   10:05 AM 07/23/2023   10:58 AM 02/16/2023   11:05 AM 12/04/2022   10:41 AM 07/31/2022     9:44 AM 01/29/2022   10:08 AM 11/27/2021    9:58 AM  Depression screen PHQ 2/9  Decreased Interest 0 0 0 0 0 0 0  Down, Depressed, Hopeless 0 0 0 0 0 0 0  PHQ - 2 Score 0 0 0 0 0 0 0     Review of Systems  Musculoskeletal:  Positive for arthralgias, back pain, gait problem and myalgias.  All other systems reviewed and are negative.      Objective:   Physical Exam Vitals and nursing note reviewed.  Constitutional:      Appearance: Normal appearance.  Neck:     Comments: Cervical Paraspinal Tenderness: C-5-C-6 Cardiovascular:     Rate and Rhythm: Normal rate and regular rhythm.     Pulses: Normal pulses.     Heart sounds: Normal heart sounds.  Pulmonary:     Effort: Pulmonary effort is normal.     Breath sounds: Normal breath sounds.  Musculoskeletal:     Comments: Normal Muscle Bulk and Muscle Testing Reveals:  Upper Extremities: Right: Full ROM and Muscle Strength  5/5 Left Upper Extremity: Decreased ROM 45 Degrees and Muscle Strength 5/5 Thoracic Paraspinal Tenderness: T-1-T-7 Mainly Left Side  Lumbar Hypersensitivity Right Greater Trochanter Tenderness Lower Extremities : Right: Decreased ROM and Muscle Strength 5/5 Right Lower Extremity Flexion Produces Pain into his Lumbar, Hip and Lower Extremity Left Lower Extremity: Full ROM and Muscle Strength 5/5 Arises from Chair slowly  Antalgic Gait     Skin:    General: Skin is warm and dry.  Neurological:     Mental Status: He is alert and oriented to person, place, and time.  Psychiatric:        Mood and Affect: Mood normal.        Behavior: Behavior normal.         Assessment & Plan:  1. 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 11/09/2023 Refilled: Oxycodone  10 mg one tablet 5 times a day  as needed for pain #150.We will continue the opioid monitoring program, this consists of regular clinic  visits, examinations, urine drug screen, pill counts as well as use of [place]  Controlled Substance Reporting system. A 12 month History has been reviewed on the   Controlled Substance Reporting System 11/09/2023.  2. Lumbar Radiculitis: Continue current medication regimen with Gabapentin . 11/09/2023 3. Intercostal neuralgia:  continue Flector  Patch Continue current medication regimen with  Voltaren  Gel, Robaxin  and Neurontin . 11/09/2023.  4. Insomnia: Continue :  trazodone . 11/09/2023 5. Muscle Spasm: Continue current medication regimen with Robaxin . 11/09/2023 6. Polyarthralgia: Continue current medication regime. Continue to Monitor. 11/09/2023 7. Chronic Bilateral Thoracic Pain: Continue HEP and Continue Current medication regimen. Continue to monitor. 11/09/2023 8. Chronic Right Hip Pain/ Avascular Necrosis: Mr. Stallard underwent TOTAL HIP ARTHROPLASTY by Robinson Chough, MD Ortho following. 11/09/2023 9. Cervical Lymphadenopathy: Endocrinologist Following. 11/09/2023 S/P Radioactive Iodine Thyroid Ablation on 05/13/2020,Continue to monitor. 11/09/2023 10. Left Elbow Pain: No complaints today.  Ortho following. Continue to monitor.11/09/2023    F/U in 2 months : Due to Financial Hardship

## 2023-11-18 LAB — COMPREHENSIVE METABOLIC PANEL WITH GFR
ALT: 26 IU/L (ref 0–44)
AST: 21 IU/L (ref 0–40)
Albumin: 4.6 g/dL (ref 3.9–4.9)
Alkaline Phosphatase: 58 IU/L (ref 44–121)
BUN/Creatinine Ratio: 10 (ref 10–24)
BUN: 12 mg/dL (ref 8–27)
Bilirubin Total: 0.2 mg/dL (ref 0.0–1.2)
CO2: 23 mmol/L (ref 20–29)
Calcium: 9.4 mg/dL (ref 8.6–10.2)
Chloride: 97 mmol/L (ref 96–106)
Creatinine, Ser: 1.21 mg/dL (ref 0.76–1.27)
Globulin, Total: 2.6 g/dL (ref 1.5–4.5)
Glucose: 162 mg/dL — ABNORMAL HIGH (ref 70–99)
Potassium: 4.6 mmol/L (ref 3.5–5.2)
Sodium: 138 mmol/L (ref 134–144)
Total Protein: 7.2 g/dL (ref 6.0–8.5)
eGFR: 67 mL/min/{1.73_m2} (ref >59)

## 2023-11-18 LAB — T4, FREE: Free T4: 0.72 ng/dL — ABNORMAL LOW (ref 0.82–1.77)

## 2023-11-18 LAB — LIPID PANEL
Chol/HDL Ratio: 5.2 ratio — ABNORMAL HIGH (ref 0.0–5.0)
Cholesterol, Total: 146 mg/dL (ref 100–199)
HDL: 28 mg/dL — ABNORMAL LOW (ref >39)
LDL Chol Calc (NIH): 78 mg/dL (ref 0–99)
Triglycerides: 242 mg/dL — ABNORMAL HIGH (ref 0–149)
VLDL Cholesterol Cal: 40 mg/dL (ref 5–40)

## 2023-11-18 LAB — T3, FREE: T3, Free: 3 pg/mL (ref 2.0–4.4)

## 2023-11-18 LAB — CORTISOL-AM, BLOOD: Cortisol - AM: 7 ug/dL (ref 6.2–19.4)

## 2023-11-18 LAB — TSH: TSH: 1.39 u[IU]/mL (ref 0.450–4.500)

## 2023-11-23 ENCOUNTER — Ambulatory Visit: Admitting: "Endocrinology

## 2023-11-30 ENCOUNTER — Ambulatory Visit: Admitting: "Endocrinology

## 2023-12-22 ENCOUNTER — Encounter: Payer: Self-pay | Admitting: "Endocrinology

## 2023-12-22 ENCOUNTER — Ambulatory Visit (INDEPENDENT_AMBULATORY_CARE_PROVIDER_SITE_OTHER): Admitting: "Endocrinology

## 2023-12-22 VITALS — BP 134/84 | HR 68 | Ht 69.0 in | Wt 268.8 lb

## 2023-12-22 DIAGNOSIS — Z7984 Long term (current) use of oral hypoglycemic drugs: Secondary | ICD-10-CM | POA: Diagnosis not present

## 2023-12-22 DIAGNOSIS — E119 Type 2 diabetes mellitus without complications: Secondary | ICD-10-CM | POA: Diagnosis not present

## 2023-12-22 DIAGNOSIS — E890 Postprocedural hypothyroidism: Secondary | ICD-10-CM

## 2023-12-22 DIAGNOSIS — Z6839 Body mass index (BMI) 39.0-39.9, adult: Secondary | ICD-10-CM

## 2023-12-22 LAB — POCT GLYCOSYLATED HEMOGLOBIN (HGB A1C): HbA1c, POC (controlled diabetic range): 8.5 % — AB (ref 0.0–7.0)

## 2023-12-22 MED ORDER — EMPAGLIFLOZIN 10 MG PO TABS: 10.0000 mg | ORAL_TABLET | Freq: Every day | ORAL | 1 refills | Status: DC

## 2023-12-22 MED ORDER — METFORMIN HCL 500 MG PO TABS: 500.0000 mg | ORAL_TABLET | Freq: Two times a day (BID) | ORAL | 1 refills | Status: DC

## 2023-12-22 MED ORDER — LEVOTHYROXINE SODIUM 50 MCG PO TABS: 50.0000 ug | ORAL_TABLET | Freq: Every day | ORAL | 1 refills | Status: DC

## 2023-12-22 NOTE — Progress Notes (Signed)
 12/22/2023          Endocrinology follow-up note   Subjective:    Patient ID: Matthew Sloan, male    DOB: 17-Aug-1960, PCP Sina Dudley, MD.   Past Medical History:  Diagnosis Date   Allergy    Chronic pain due to trauma    Disturbance of skin sensation    Intercostal neuralgia    Lumbosacral neuritis    Myocardial infarction [facility])    Rotator cuff (capsule) sprain    Snores     Past Surgical History:  Procedure Laterality Date   bilateral tubes placed in ears  04/12/2012   CARPAL TUNNEL RELEASE  12/15/2011   Procedure: CARPAL TUNNEL RELEASE;  Surgeon: Milagros Alf, MD;  Location: [facility];  Service: Orthopedics;  Laterality: Left;   MULTIPLE TOOTH EXTRACTIONS     SHOULDER SURGERY  07/13/2010   lt   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   ULNAR TUNNEL RELEASE  12/15/2011   Procedure: CUBITAL TUNNEL RELEASE;  Surgeon: Milagros Alf, MD;  Location: [facility];  Service: Orthopedics;  Laterality: Left;  left cubital tunnel release    Social History   Socioeconomic History   Marital status: Single    Spouse name: Not on file   Number of children: Not on file   Years of education: Not on file   Highest education level: Not on file  Occupational History   Not on file  Tobacco Use   Smoking status: Every Day    Current packs/day: 1.00    Average packs/day: 1 pack/day for 30.0 years (30.0 ttl pk-yrs)    Types: Cigarettes   Smokeless tobacco: Never   Tobacco comments:    working on it-down to 0.5 ppd  Vaping Use   Vaping status: Never Used  Substance and Sexual Activity   Alcohol  use: No   Drug use: No   Sexual activity: Not on file  Other Topics Concern   Not on file  Social History Narrative   Not on file   Social Drivers of Health   Financial Resource Strain: Not on file  Food Insecurity: Not on file  Transportation Needs: Not on file  Physical Activity: Not on file  Stress: Not on file  Social Connections: Not on  file    Family History  Problem Relation Age of Onset   Cancer Mother 21       breast cancer   Heart disease Father 70       CHF    Outpatient Encounter Medications as of 12/22/2023  Medication Sig   empagliflozin (JARDIANCE) 10 MG TABS tablet Take 1 tablet (10 mg total) by mouth daily before breakfast.   albuterol  (VENTOLIN  HFA) 108 (90 Base) MCG/ACT inhaler Inhale 2 puffs into the lungs as needed.   apixaban (ELIQUIS) 5 MG TABS tablet Take 1 tablet by mouth 2 (two) times daily.   aspirin 81 MG chewable tablet    Cholecalciferol (VITAMIN D3) 25 MCG (1000 UT) CAPS Take 1 capsule by mouth daily.   Cholecalciferol 50 MCG (2000 UT) TABS Take by mouth.   diclofenac  (FLECTOR ) 1.3 % PTCH Place 1 patch onto the skin 2 (two) times daily.   docusate sodium (COLACE) 100 MG capsule Take by mouth.   doxycycline (VIBRA-TABS) 100 MG tablet Take 1 tablet by mouth 2 (two) times daily.   levothyroxine  (SYNTHROID ) 50 MCG tablet Take 1 tablet (50 mcg total) by mouth daily before breakfast.   metFORMIN (GLUCOPHAGE)  500 MG tablet Take 1 tablet (500 mg total) by mouth 2 (two) times daily with a meal.   methocarbamol  (ROBAXIN ) 500 MG tablet Take 1 tablet (500 mg total) by mouth 3 (three) times daily. (Patient taking differently: Take 500 mg by mouth 3 (three) times daily as needed.)   methylPREDNISolone (MEDROL) 4 MG tablet TAKE BY PACKAGE DIRECTIONS AS NEEDED FOR COPD EXACERBATION   metoprolol succinate (TOPROL-XL) 50 MG 24 hr tablet Take 1 tablet by mouth 2 (two) times daily.   naloxone (NARCAN) nasal spray 4 mg/0.1 mL For suspected overdose give 1 spray intranasally. Call 911 if patient is unresponsive or in respiratory distress. May repeat every 2 min.   Oxycodone  HCl 10 MG TABS Take 1 tablet (10 mg total) by mouth 5 (five) times daily as needed.   PEPCID 20 MG tablet Take by mouth.   pravastatin (PRAVACHOL) 20 MG tablet Take 20 mg by mouth at bedtime.   tranexamic acid (LYSTEDA) 650 MG TABS tablet Take  by mouth.   traZODone  (DESYREL ) 50 MG tablet Take 1 tablet (50 mg total) by mouth at bedtime.   [DISCONTINUED] levothyroxine  (SYNTHROID ) 25 MCG tablet TAKE ONE TABLET EVERY DAY BEFORE BREAKFAST.   [DISCONTINUED] levothyroxine  (SYNTHROID ) 75 MCG tablet    [DISCONTINUED] metFORMIN (GLUCOPHAGE) 500 MG tablet Take 500 mg by mouth 2 (two) times daily with a meal.   [DISCONTINUED] methimazole  (TAPAZOLE ) 5 MG tablet    No facility-administered encounter medications on file as of 12/22/2023.    ALLERGIES: Allergies  Allergen Reactions   Penicillin G Hives   Penicillins     VACCINATION STATUS:  There is no immunization history on file for this patient.   HPI  Matthew Sloan is 63 y.o. male who is presenting to follow-up after he was given radioactive iodine for treatment of hyperthyroidism from Graves' disease.    Referral requested by  Sina Dudley, MD. After treatment with RAI he developed hypothyroidism for which she was started on levothyroxine . He was kept on levothyroxine  25 mg p.o. daily before breakfast.  His previsit labs are consistent with under replacement.  Patient presents with thyroid function test consistent with improving, more appropriate replacement.   - Recently, he was diagnosed with type 2 diabetes, on metformin 500 mg p.o. once a day.  His point-of-care A1c is 8.5% today increasing from 7.2% during his last visit. He did not engage with lifestyle modification discussed and prescribed for him at last visit.  He presents with steady weight gain.  He continues to smoke, has fatigue and dyspnea on exertion at baseline.    He denies palpitation, tremors,/nor heat/cold intolerance.  He denies dysphagia, shortness of breath.   He is known to have goiter with no discrete nodules. -He denies recent voice change.  He is a chronic active smoker.   he denies family history of thyroid dysfunction.  He denies  family hx of thyroid cancer.                             Review of systems As above.   Objective:    BP 134/84   Pulse 68   Ht 5' 9 (1.753 m)   Wt 268 lb 12.8 oz (121.9 kg)   BMI 39.69 kg/m   Wt Readings from Last 3 Encounters:  12/22/23 268 lb 12.8 oz (121.9 kg)  11/09/23 262 lb (118.8 kg)  07/23/23 260 lb (117.9 kg)  Neck exam: Thyroid generally smaller than last exam.  No gross palpable goiter.                  CMP     Component Value Date/Time   NA 138 11/17/2023 0910   K 4.6 11/17/2023 0910   CL 97 11/17/2023 0910   CO2 23 11/17/2023 0910   GLUCOSE 162 (H) 11/17/2023 0910   GLUCOSE 104 (H) 01/03/2010 0545   BUN 12 11/17/2023 0910   CREATININE 1.21 11/17/2023 0910   CALCIUM 9.4 11/17/2023 0910   PROT 7.2 11/17/2023 0910   ALBUMIN 4.6 11/17/2023 0910   AST 21 11/17/2023 0910   ALT 26 11/17/2023 0910   ALKPHOS 58 11/17/2023 0910   BILITOT 0.2 11/17/2023 0910   GFRNONAA >60 01/03/2010 0545   GFRAA  01/03/2010 0545    >60        The eGFR has been calculated using the MDRD equation. This calculation has not been validated in all clinical situations. eGFR's persistently <60 mL/min signify possible Chronic Kidney Disease.     CBC    Component Value Date/Time   WBC 8.6 01/07/2010 0945   RBC 3.81 (L) 01/07/2010 0945   HGB 15.7 12/15/2011 0809   HCT 33.9 (L) 01/07/2010 0945   PLT 514 (H) 01/07/2010 0945   MCV 89.1 01/07/2010 0945   MCH 29.6 01/07/2010 0945   MCHC 33.3 01/07/2010 0945   RDW 13.6 01/07/2010 0945   LYMPHSABS 2.3 01/07/2010 0945   MONOABS 0.6 01/07/2010 0945   EOSABS 0.7 01/07/2010 0945   BASOSABS 0.1 01/07/2010 0945   Recent Results (from the past 2160 hours)  Cortisol-am, blood     Status: None   Collection Time: 11/17/23  9:10 AM  Result Value Ref Range   Cortisol - AM 7.0 6.2 - 19.4 ug/dL  Lipid Panel     Status: Abnormal   Collection Time: 11/17/23  9:10 AM  Result Value Ref Range   Cholesterol, Total 146 100 - 199 mg/dL   Triglycerides 161 (H) 0 - 149 mg/dL   HDL 28 (L) >09  mg/dL   VLDL Cholesterol Cal 40 5 - 40 mg/dL   LDL Chol Calc (NIH) 78 0 - 99 mg/dL   Chol/HDL Ratio 5.2 (H) 0.0 - 5.0 ratio    Comment:                                   T. Chol/HDL Ratio                                             Men  Women                               1/2 Avg.Risk  3.4    3.3                                   Avg.Risk  5.0    4.4                                2X Avg.Risk  9.6    7.1  3X Avg.Risk 23.4   11.0   Comprehensive metabolic panel     Status: Abnormal   Collection Time: 11/17/23  9:10 AM  Result Value Ref Range   Glucose 162 (H) 70 - 99 mg/dL   BUN 12 8 - 27 mg/dL   Creatinine, Ser 4.09 0.76 - 1.27 mg/dL   eGFR 67 >81 XB/JYN/8.29   BUN/Creatinine Ratio 10 10 - 24   Sodium 138 134 - 144 mmol/L   Potassium 4.6 3.5 - 5.2 mmol/L   Chloride 97 96 - 106 mmol/L   CO2 23 20 - 29 mmol/L   Calcium 9.4 8.6 - 10.2 mg/dL   Total Protein 7.2 6.0 - 8.5 g/dL   Albumin 4.6 3.9 - 4.9 g/dL   Globulin, Total 2.6 1.5 - 4.5 g/dL   Bilirubin Total 0.2 0.0 - 1.2 mg/dL   Alkaline Phosphatase 58 44 - 121 IU/L   AST 21 0 - 40 IU/L   ALT 26 0 - 44 IU/L  T3, Free     Status: None   Collection Time: 11/17/23  9:10 AM  Result Value Ref Range   T3, Free 3.0 2.0 - 4.4 pg/mL  T4, Free     Status: Abnormal   Collection Time: 11/17/23  9:10 AM  Result Value Ref Range   Free T4 0.72 (L) 0.82 - 1.77 ng/dL  TSH     Status: None   Collection Time: 11/17/23  9:10 AM  Result Value Ref Range   TSH 1.390 0.450 - 4.500 uIU/mL  HgB A1c     Status: Abnormal   Collection Time: 12/22/23  3:20 PM  Result Value Ref Range   Hemoglobin A1C     HbA1c POC (<> result, manual entry)     HbA1c, POC (prediabetic range)     HbA1c, POC (controlled diabetic range) 8.5 (A) 0.0 - 7.0 %    April 24, 2020 thyroid uptake and scan: 24-hour uptake 56.9%.  Normal range 15 to 35%.  Previously, the 24-hour uptake was similarly elevated to 57.3%. Impression: Homogeneously  increased uptake within an enlarged thyroid gland, is suggestive of Graves' disease.   RAI was given to treat hyperthyroidism in November 2021.  Assessment & Plan:   1.  RAI induced hypothyroidism  2.  Graves' disease-resolved 3.  Morbid obesity complicated by type 2 diabetes See notes from prior visits.  He reports better consistency taking his levothyroxine  this time.  His previsit labs are consistent with inadequate replacement.  I discussed and increase his levothyroxine  to 50 mcg p.o. daily before breakfast.    - We discussed about the correct intake of his thyroid hormone, on empty stomach at fasting, with water, separated by at least 30 minutes from breakfast and other medications,  and separated by more than 4 hours from calcium, iron, multivitamins, acid reflux medications (PPIs). -Patient is made aware of the fact that thyroid hormone replacement is needed for life, dose to be adjusted by periodic monitoring of thyroid function tests.   There is apparent reduction in size of his thyroid as a result of radioactive iodine treatment.    His point-of-care A1c is 8.5% today.  He does not monitor blood glucose regularly.  He is advised to increase his metformin to 500 mg p.o. twice daily with breakfast and supper.  He may also benefit from addition of SGLT2 inhibitors.  I discussed and added Jardiance 10 mg daily at breakfast.  Side effects and precautions discussed with him.   He is advised  on restrictions of ultra processed foods, and drinks.  He is advised to eat more whole food plant-based diet.  He will have previsit labs including thyroid function test before next visit and point-of-care A1c.     - I advised him to maintain close follow up with Zakhary, Boshra G, MD for primary care needs.   I spent  26  minutes in the care of the patient today including review of labs from CMP, Lipids, Thyroid Function, Hematology (current and previous including abstractions from other  facilities); face-to-face time discussing  his blood glucose readings/logs, discussing hypoglycemia and hyperglycemia episodes and symptoms, medications doses, his options of short and long term treatment based on the latest standards of care / guidelines;  discussion about incorporating lifestyle medicine;  and documenting the encounter. Risk reduction counseling performed per USPSTF guidelines to reduce  obesity and cardiovascular risk factors.     Please refer to Patient Instructions for Blood Glucose Monitoring and Insulin/Medications Dosing Guide  in media tab for additional information. Please  also refer to  Patient Self Inventory in the Media  tab for reviewed elements of pertinent patient history.  Matthew Sloan participated in the discussions, expressed understanding, and voiced agreement with the above plans.  All questions were answered to his satisfaction. he is encouraged to contact clinic should he have any questions or concerns prior to his return visit.   Follow up plan: Return in about 4 months (around 04/22/2024) for F/U with Pre-visit Labs, A1c -NV.   Thank you for involving me in the care of this pleasant patient, and I will continue to update you with his progress.  Kalvin Orf, MD [facility] Phone: [phone]  Fax: [phone]   12/22/2023, 4:32 PM  This note was partially dictated with voice recognition software. Similar sounding words can be transcribed inadequately or may not  be corrected upon review.

## 2023-12-22 NOTE — Patient Instructions (Signed)

## 2023-12-30 ENCOUNTER — Telehealth: Payer: Self-pay

## 2023-12-30 NOTE — Telephone Encounter (Signed)
 Spoke with pt making him aware his BG numbers are good and within the intended targets for him per Dr.Nida. Advised pt if he experiencing symptoms of hypoglycemia he can snack on fruits and nuts. Pt voiced understanding.

## 2023-12-30 NOTE — Telephone Encounter (Signed)
 Pt called stating he experienced a hypoglycemic episode where his BG dropped to 71 one evening. Pt states he randomly checks his BG. States yesterday morning before breakfast BG was 117 and last night before bed BG 91. States this morning his BG was 134 prior to breakfast. Pt is taking Jardiance  10mg  daily and Metformin  500mg  bid. Please advise if any changes are needed.

## 2023-12-31 ENCOUNTER — Other Ambulatory Visit: Payer: Self-pay

## 2023-12-31 ENCOUNTER — Telehealth: Payer: Self-pay | Admitting: "Endocrinology

## 2023-12-31 DIAGNOSIS — E119 Type 2 diabetes mellitus without complications: Secondary | ICD-10-CM

## 2023-12-31 MED ORDER — METFORMIN HCL 500 MG PO TABS: 500.0000 mg | ORAL_TABLET | Freq: Two times a day (BID) | ORAL | 1 refills | Status: AC

## 2023-12-31 NOTE — Telephone Encounter (Signed)
 Pt was here on 6/11 and Dr Monte Antonio increased his Metformin  but did not send in the RX. He has 1 pill left. Can you send it in today?

## 2023-12-31 NOTE — Telephone Encounter (Signed)
 Rx for Metformin  500mg  po bid resent to [facility] in [place].

## 2024-01-07 ENCOUNTER — Encounter: Attending: Registered Nurse | Admitting: Registered Nurse

## 2024-01-13 ENCOUNTER — Encounter: Attending: Registered Nurse | Admitting: Registered Nurse

## 2024-01-17 ENCOUNTER — Telehealth: Payer: Self-pay | Admitting: Registered Nurse

## 2024-01-17 MED ORDER — OXYCODONE HCL 10 MG PO TABS: 10.0000 mg | ORAL_TABLET | Freq: Every day | ORAL | 0 refills | Status: DC | PRN

## 2024-01-17 NOTE — Telephone Encounter (Signed)
 PMP was Reviewed.  Matthew Sloan found his brother deceased in his home, scheduled appointment was changed Oxycodone  e-scribed to pharmacy.  Call placed to Matthew Sloan regarding the above, he verbalizes understanding.  Emotional Support given, he verbalizes understanding.

## 2024-01-17 NOTE — Telephone Encounter (Signed)
 Patient not able to make appt tomorrow--just found brother dead by heart attack.  I have rescheduled him for 7/22.  Will need medication refill.

## 2024-01-18 ENCOUNTER — Ambulatory Visit: Admitting: Registered Nurse

## 2024-02-01 ENCOUNTER — Encounter: Admitting: Registered Nurse

## 2024-02-16 NOTE — Progress Notes (Deleted)
 Subjective:    Patient ID: Matthew Sloan, male    DOB: June 20, 1961, 63 y.o.   MRN: [id number]  HPI   Pain Inventory Average Pain {NUMBERS; 0-10:5044} Pain Right Now {NUMBERS; 0-10:5044} My pain is {PAIN DESCRIPTION:21022940}  In the last 24 hours, has pain interfered with the following? General activity {NUMBERS; 0-10:5044} Relation with others {NUMBERS; 0-10:5044} Enjoyment of life {NUMBERS; 0-10:5044} What TIME of day is your pain at its worst? {time of day:24191} Sleep (in general) {BHH GOOD/FAIR/POOR:22877}  Pain is worse with: {ACTIVITIES:21022942} Pain improves with: {PAIN IMPROVES TPUY:78977056} Relief from Meds: {NUMBERS; 0-10:5044}  Family History  Problem Relation Age of Onset   Cancer Mother 88       breast cancer   Heart disease Father 57       CHF   Social History   Socioeconomic History   Marital status: Single    Spouse name: Not on file   Number of children: Not on file   Years of education: Not on file   Highest education level: Not on file  Occupational History   Not on file  Tobacco Use   Smoking status: Every Day    Current packs/day: 1.00    Average packs/day: 1 pack/day for 30.0 years (30.0 ttl pk-yrs)    Types: Cigarettes   Smokeless tobacco: Never   Tobacco comments:    working on it-down to 0.5 ppd  Vaping Use   Vaping status: Never Used  Substance and Sexual Activity   Alcohol  use: No   Drug use: No   Sexual activity: Not on file  Other Topics Concern   Not on file  Social History Narrative   Not on file   Social Drivers of Health   Financial Resource Strain: Not on file  Food Insecurity: Not on file  Transportation Needs: Not on file  Physical Activity: Not on file  Stress: Not on file  Social Connections: Not on file   Past Surgical History:  Procedure Laterality Date   bilateral tubes placed in ears  04/12/2012   CARPAL TUNNEL RELEASE  12/15/2011   Procedure: CARPAL TUNNEL RELEASE;  Surgeon: Franky JONELLE Curia, MD;   Location: [facility];  Service: Orthopedics;  Laterality: Left;   MULTIPLE TOOTH EXTRACTIONS     SHOULDER SURGERY  07/13/2010   lt   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   ULNAR TUNNEL RELEASE  12/15/2011   Procedure: CUBITAL TUNNEL RELEASE;  Surgeon: Franky JONELLE Curia, MD;  Location: [facility];  Service: Orthopedics;  Laterality: Left;  left cubital tunnel release   Past Surgical History:  Procedure Laterality Date   bilateral tubes placed in ears  04/12/2012   CARPAL TUNNEL RELEASE  12/15/2011   Procedure: CARPAL TUNNEL RELEASE;  Surgeon: Franky JONELLE Curia, MD;  Location: [facility];  Service: Orthopedics;  Laterality: Left;   MULTIPLE TOOTH EXTRACTIONS     SHOULDER SURGERY  07/13/2010   lt   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   ULNAR TUNNEL RELEASE  12/15/2011   Procedure: CUBITAL TUNNEL RELEASE;  Surgeon: Franky JONELLE Curia, MD;  Location: [facility];  Service: Orthopedics;  Laterality: Left;  left cubital tunnel release   Past Medical History:  Diagnosis Date   Allergy    Chronic pain due to trauma    Disturbance of skin sensation    Intercostal neuralgia    Lumbosacral neuritis    Myocardial infarction (HCC)    Rotator cuff (capsule) sprain  Snores    There were no vitals taken for this visit.  Opioid Risk Score:   Fall Risk Score:  `1  Depression screen [facility] 2/9     11/09/2023   10:05 AM 07/23/2023   10:58 AM 02/16/2023   11:05 AM 12/04/2022   10:41 AM 07/31/2022    9:44 AM 01/29/2022   10:08 AM 11/27/2021    9:58 AM  Depression screen [facility] 2/9  Decreased Interest 0 0 0 0 0 0 0  Down, Depressed, Hopeless 0 0 0 0 0 0 0  PHQ - 2 Score 0 0 0 0 0 0 0    Review of Systems     Objective:   Physical Exam        Assessment & Plan:

## 2024-02-18 ENCOUNTER — Encounter: Admitting: Registered Nurse

## 2024-03-10 ENCOUNTER — Encounter: Admitting: Registered Nurse

## 2024-03-10 ENCOUNTER — Telehealth: Payer: Self-pay | Admitting: Registered Nurse

## 2024-03-10 ENCOUNTER — Other Ambulatory Visit: Payer: Self-pay | Admitting: Registered Nurse

## 2024-03-10 MED ORDER — OXYCODONE HCL 10 MG PO TABS: 10.0000 mg | ORAL_TABLET | Freq: Every day | ORAL | 0 refills | Status: DC | PRN

## 2024-03-10 NOTE — Telephone Encounter (Signed)
 PMP was Reviewed.  Chart Reviewed. [name] was called, he must keep his scheduled appointment on 03/14/2024. Oxycodone  partial fill prescription sent to pharmacy. Mr. Rosenwald is aware of the above

## 2024-03-10 NOTE — Telephone Encounter (Signed)
 Patient canceled your appointment today, said he has an upset stomach.  He said he needs refill on medication, he ran out 3 days ago.  Didn't know when you wanted us  to reschedule his appointment.

## 2024-03-14 ENCOUNTER — Encounter: Attending: Registered Nurse | Admitting: Registered Nurse

## 2024-03-14 ENCOUNTER — Encounter: Payer: Self-pay | Admitting: Registered Nurse

## 2024-03-14 VITALS — BP 117/75 | HR 71 | Ht 69.0 in | Wt 254.0 lb

## 2024-03-14 DIAGNOSIS — M2550 Pain in unspecified joint: Secondary | ICD-10-CM | POA: Insufficient documentation

## 2024-03-14 DIAGNOSIS — M25512 Pain in left shoulder: Secondary | ICD-10-CM | POA: Diagnosis present

## 2024-03-14 DIAGNOSIS — G894 Chronic pain syndrome: Secondary | ICD-10-CM | POA: Diagnosis present

## 2024-03-14 DIAGNOSIS — M546 Pain in thoracic spine: Secondary | ICD-10-CM | POA: Diagnosis present

## 2024-03-14 DIAGNOSIS — M519 Unspecified thoracic, thoracolumbar and lumbosacral intervertebral disc disorder: Secondary | ICD-10-CM | POA: Insufficient documentation

## 2024-03-14 DIAGNOSIS — G8929 Other chronic pain: Secondary | ICD-10-CM | POA: Insufficient documentation

## 2024-03-14 DIAGNOSIS — Z79891 Long term (current) use of opiate analgesic: Secondary | ICD-10-CM | POA: Diagnosis present

## 2024-03-14 DIAGNOSIS — Z5181 Encounter for therapeutic drug level monitoring: Secondary | ICD-10-CM | POA: Insufficient documentation

## 2024-03-14 DIAGNOSIS — M5416 Radiculopathy, lumbar region: Secondary | ICD-10-CM | POA: Insufficient documentation

## 2024-03-14 MED ORDER — OXYCODONE HCL 10 MG PO TABS: 10.0000 mg | ORAL_TABLET | Freq: Every day | ORAL | 0 refills | Status: DC | PRN

## 2024-03-14 NOTE — Progress Notes (Signed)
 Subjective:    Patient ID: Matthew Sloan, male    DOB: 03-18-1961, 63 y.o.   MRN: [id number]  HPI: Matthew Sloan is a 63 y.o. male who returns for follow up appointment for chronic pain and medication refill. He states his pain is located in his left shoulder, lower back radiating into his bilateral lower extremities R>L and generalized joint pain. He  rates his pain 10. His current exercise regime is walking and performing stretching exercises.  Matthew Sloan brother passed awy and emotional support given.   Matthew Sloan Morphine equivalent is 75.00 MME.   Oral Swab ordered today.     Pain Inventory Average Pain 9 Pain Right Now 10 My pain is constant, sharp, burning, stabbing, tingling, and aching  In the last 24 hours, has pain interfered with the following? General activity 10 Relation with others 8 Enjoyment of life 8 What TIME of day is your pain at its worst? morning , daytime, evening, and night Sleep (in general) Poor  Pain is worse with: walking, bending, sitting, inactivity, and standing Pain improves with: medication Relief from Meds: 3  Family History  Problem Relation Age of Onset   Cancer Mother 62       breast cancer   Heart disease Father 27       CHF   Social History   Socioeconomic History   Marital status: Single    Spouse name: Not on file   Number of children: Not on file   Years of education: Not on file   Highest education level: Not on file  Occupational History   Not on file  Tobacco Use   Smoking status: Every Day    Current packs/day: 1.00    Average packs/day: 1 pack/day for 30.0 years (30.0 ttl pk-yrs)    Types: Cigarettes   Smokeless tobacco: Never   Tobacco comments:    working on it-down to 0.5 ppd  Vaping Use   Vaping status: Never Used  Substance and Sexual Activity   Alcohol  use: No   Drug use: No   Sexual activity: Not on file  Other Topics Concern   Not on file  Social History Narrative   Not on file   Social Drivers  of Health   Financial Resource Strain: Not on file  Food Insecurity: Not on file  Transportation Needs: Not on file  Physical Activity: Not on file  Stress: Not on file  Social Connections: Not on file   Past Surgical History:  Procedure Laterality Date   bilateral tubes placed in ears  04/12/2012   CARPAL TUNNEL RELEASE  12/15/2011   Procedure: CARPAL TUNNEL RELEASE;  Surgeon: Matthew JONELLE Curia, MD;  Location: [facility];  Service: Orthopedics;  Laterality: Left;   MULTIPLE TOOTH EXTRACTIONS     SHOULDER SURGERY  07/13/2010   lt   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   ULNAR TUNNEL RELEASE  12/15/2011   Procedure: CUBITAL TUNNEL RELEASE;  Surgeon: Matthew JONELLE Curia, MD;  Location: [facility];  Service: Orthopedics;  Laterality: Left;  left cubital tunnel release   Past Surgical History:  Procedure Laterality Date   bilateral tubes placed in ears  04/12/2012   CARPAL TUNNEL RELEASE  12/15/2011   Procedure: CARPAL TUNNEL RELEASE;  Surgeon: Matthew JONELLE Curia, MD;  Location: [facility];  Service: Orthopedics;  Laterality: Left;   MULTIPLE TOOTH EXTRACTIONS     SHOULDER SURGERY  07/13/2010   lt  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   ULNAR TUNNEL RELEASE  12/15/2011   Procedure: CUBITAL TUNNEL RELEASE;  Surgeon: Matthew JONELLE Curia, MD;  Location: [facility];  Service: Orthopedics;  Laterality: Left;  left cubital tunnel release   Past Medical History:  Diagnosis Date   Allergy    Chronic pain due to trauma    Disturbance of skin sensation    Intercostal neuralgia    Lumbosacral neuritis    Myocardial infarction (HCC)    Rotator cuff (capsule) sprain    Snores    BP 117/75 (BP Location: Left Arm, Patient Position: Sitting, Cuff Size: Large)   Pulse 71   Ht 5' 9 (1.753 m)   Wt 254 lb (115.2 kg)   SpO2 93%   BMI 37.51 kg/m   Opioid Risk Score:   Fall Risk Score:  `1  Depression screen [facility] 2/9     03/14/2024    9:14 AM  11/09/2023   10:05 AM 07/23/2023   10:58 AM 02/16/2023   11:05 AM 12/04/2022   10:41 AM 07/31/2022    9:44 AM 01/29/2022   10:08 AM  Depression screen PHQ 2/9  Decreased Interest 0 0 0 0 0 0 0  Down, Depressed, Hopeless 0 0 0 0 0 0 0  PHQ - 2 Score 0 0 0 0 0 0 0     Review of Systems  Musculoskeletal:        Left shoulder pain, back pain, bilateral leg pain  All other systems reviewed and are negative.      Objective:   Physical Exam Vitals and nursing note reviewed.  Constitutional:      Appearance: Normal appearance. He is obese.  Neck:     Comments: Cervical Paraspinal Tenderness: C-5-C-6 Cardiovascular:     Rate and Rhythm: Normal rate and regular rhythm.  Pulmonary:     Effort: Pulmonary effort is normal.     Breath sounds: Normal breath sounds.  Musculoskeletal:     Comments: Normal Muscle Bulk and Muscle Testing Reveals:  Upper Extremities: Right: Decreased ROM 90 Degrees  and Muscle Strength  5/5 Left Upper Extremity: Decreased ROM 45 Degrees and Muscle Strength 5/5 Thoracic Paraspinal Tenderness: T-1-T-7 Mainly Left Side  Lumbar Hypersensitivity Bilateral Greater Trochanter Tenderness Lower Extremities: Right: Decreased ROM and Muscle Strength 5/5 Right Lower Extremity Flexion Produces Pain into his Right Hip Left Lower Extremity: Full ROM and Muscle Strength 5/5 Arises from Chair slowly Antalgic  Gait     Skin:    General: Skin is warm and dry.  Neurological:     Mental Status: He is alert and oriented to person, place, and time.  Psychiatric:        Mood and Affect: Mood normal.        Behavior: Behavior normal.          Assessment & Plan:  1. 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 03/14/2024 Refilled: Oxycodone  10 mg one tablet 5 times a day  as needed for pain #150.We will continue the opioid monitoring program, this consists of  regular clinic visits, examinations, urine drug screen, pill counts as well as use of [place]  Controlled Substance Reporting system. A 12 month History has been reviewed on the [place]  Controlled Substance Reporting System 03/14/2024.  2. Lumbar Radiculitis: Continue current medication regimen with Gabapentin . 03/14/2024 3. Intercostal neuralgia:  continue Flector  Patch Continue current  medication regimen with Voltaren  Gel, Robaxin  and Neurontin . 03/14/2024.  4. Insomnia: Continue :  trazodone . 03/14/2024 5. Muscle Spasm: Continue current medication regimen with Robaxin . 03/14/2024 6. Polyarthralgia: Continue current medication regime. Continue to Monitor. 03/14/2024 7. Chronic Bilateral Thoracic Pain: Continue HEP and Continue Current medication regimen. Continue to monitor. 03/14/2024 8. Chronic Right Hip Pain/ Avascular Necrosis: Mr. Nickolson underwent TOTAL HIP ARTHROPLASTY by Jackson Coy, MD Ortho following. 03/14/2024 9. Cervical Lymphadenopathy: Endocrinologist Following. 03/14/2024 S/P Radioactive Iodine Thyroid Ablation on 05/13/2020,Continue to monitor. 03/14/2024 10. Left Elbow Pain: No complaints today.  Ortho following. Continue to monitor.03/14/2024    F/U in 2 months : Due to Financial Hardship

## 2024-03-17 LAB — DRUG TOX MONITOR 1 W/CONF, ORAL FLD
Amphetamines: NEGATIVE ng/mL (ref <10)
Barbiturates: NEGATIVE ng/mL (ref <10)
Benzodiazepines: NEGATIVE ng/mL (ref <0.50)
Buprenorphine: NEGATIVE ng/mL (ref <0.10)
Cocaine: NEGATIVE ng/mL (ref <5.0)
Codeine: NEGATIVE ng/mL (ref <2.5)
Cotinine: >250 ng/mL — ABNORMAL HIGH (ref <5.0)
Dihydrocodeine: NEGATIVE ng/mL (ref <2.5)
Fentanyl: NEGATIVE ng/mL (ref <0.10)
Heroin Metabolite: NEGATIVE ng/mL (ref <1.0)
Hydrocodone: NEGATIVE ng/mL (ref <2.5)
Hydromorphone: NEGATIVE ng/mL (ref <2.5)
MARIJUANA: NEGATIVE ng/mL (ref <2.5)
MDMA: NEGATIVE ng/mL (ref <10)
Meprobamate: NEGATIVE ng/mL (ref <2.5)
Methadone: NEGATIVE ng/mL (ref <5.0)
Morphine: NEGATIVE ng/mL (ref <2.5)
Nicotine Metabolite: POSITIVE ng/mL — AB (ref <5.0)
Norhydrocodone: NEGATIVE ng/mL (ref <2.5)
Noroxycodone: 37 ng/mL — ABNORMAL HIGH (ref <2.5)
Opiates: POSITIVE ng/mL — AB (ref <2.5)
Oxycodone: >250 ng/mL — ABNORMAL HIGH (ref <2.5)
Oxymorphone: 2.9 ng/mL — ABNORMAL HIGH (ref <2.5)
Phencyclidine: NEGATIVE ng/mL (ref <10)
Tapentadol: NEGATIVE ng/mL (ref <5.0)
Tramadol: NEGATIVE ng/mL (ref <5.0)
Zolpidem: NEGATIVE ng/mL (ref <5.0)

## 2024-03-17 LAB — DRUG TOX ALC METAB W/CON, ORAL FLD: Alcohol Metabolite: NEGATIVE ng/mL (ref <25)

## 2024-04-25 ENCOUNTER — Ambulatory Visit: Admitting: "Endocrinology

## 2024-05-12 ENCOUNTER — Encounter: Admitting: Registered Nurse

## 2024-05-19 ENCOUNTER — Encounter: Admitting: Registered Nurse

## 2024-05-29 ENCOUNTER — Other Ambulatory Visit: Payer: Self-pay | Admitting: "Endocrinology

## 2024-05-30 ENCOUNTER — Encounter: Attending: Registered Nurse | Admitting: Registered Nurse

## 2024-05-30 ENCOUNTER — Encounter: Payer: Self-pay | Admitting: Registered Nurse

## 2024-05-30 VITALS — BP 120/71 | HR 84 | Ht 69.0 in | Wt 252.0 lb

## 2024-05-30 DIAGNOSIS — M25512 Pain in left shoulder: Secondary | ICD-10-CM | POA: Insufficient documentation

## 2024-05-30 DIAGNOSIS — G8929 Other chronic pain: Secondary | ICD-10-CM | POA: Insufficient documentation

## 2024-05-30 DIAGNOSIS — M8700 Idiopathic aseptic necrosis of unspecified bone: Secondary | ICD-10-CM | POA: Diagnosis present

## 2024-05-30 DIAGNOSIS — M519 Unspecified thoracic, thoracolumbar and lumbosacral intervertebral disc disorder: Secondary | ICD-10-CM | POA: Diagnosis present

## 2024-05-30 DIAGNOSIS — G894 Chronic pain syndrome: Secondary | ICD-10-CM | POA: Insufficient documentation

## 2024-05-30 DIAGNOSIS — M5416 Radiculopathy, lumbar region: Secondary | ICD-10-CM | POA: Diagnosis not present

## 2024-05-30 DIAGNOSIS — Z5181 Encounter for therapeutic drug level monitoring: Secondary | ICD-10-CM | POA: Diagnosis present

## 2024-05-30 DIAGNOSIS — M2550 Pain in unspecified joint: Secondary | ICD-10-CM | POA: Diagnosis present

## 2024-05-30 DIAGNOSIS — M546 Pain in thoracic spine: Secondary | ICD-10-CM | POA: Insufficient documentation

## 2024-05-30 DIAGNOSIS — Z79891 Long term (current) use of opiate analgesic: Secondary | ICD-10-CM | POA: Diagnosis present

## 2024-05-30 MED ORDER — OXYCODONE HCL 10 MG PO TABS: 10.0000 mg | ORAL_TABLET | Freq: Every day | ORAL | 0 refills | Status: DC | PRN

## 2024-05-30 NOTE — Progress Notes (Signed)
 ++  Subjective:    Patient ID: Matthew Sloan, male    DOB: 1961/05/05, 63 y.o.   MRN: [id number]  HPI: [name] is a 63 y.o. male who returns for follow up appointment for chronic pain and medication refill. He states his pain is located in his left shoulder, upper- lower back radiating into his right lower extremity. Also reports right hip pain. He rates his pain 9. His current exercise regime is walking short distances and using bands to performing stretching exercises.  [name] Morphine equivalent is 75.00 MME.   Oral Swab was Performed today.    Pain Inventory Average Pain 9 Pain Right Now 9 My pain is sharp, burning, stabbing, tingling, and aching  In the last 24 hours, has pain interfered with the following? General activity 10 Relation with others 8 Enjoyment of life 8 What TIME of day is your pain at its worst? morning , daytime, evening, and night Sleep (in general) Poor  Pain is worse with: walking, bending, sitting, inactivity, and standing Pain improves with: medication Relief from Meds: 2  Family History  Problem Relation Age of Onset   Cancer Mother 36       breast cancer   Heart disease Father 90       CHF   Social History   Socioeconomic History   Marital status: Single    Spouse name: Not on file   Number of children: Not on file   Years of education: Not on file   Highest education level: Not on file  Occupational History   Not on file  Tobacco Use   Smoking status: Every Day    Current packs/day: 1.00    Average packs/day: 1 pack/day for 30.0 years (30.0 ttl pk-yrs)    Types: Cigarettes   Smokeless tobacco: Never   Tobacco comments:    working on it-down to 0.5 ppd  Vaping Use   Vaping status: Never Used  Substance and Sexual Activity   Alcohol  use: No   Drug use: No   Sexual activity: Not on file  Other Topics Concern   Not on file  Social History Narrative   Not on file   Social Drivers of Health   Financial Resource Strain:  Not on file  Food Insecurity: Not on file  Transportation Needs: Not on file  Physical Activity: Not on file  Stress: Not on file  Social Connections: Not on file   Past Surgical History:  Procedure Laterality Date   bilateral tubes placed in ears  04/12/2012   CARPAL TUNNEL RELEASE  12/15/2011   Procedure: CARPAL TUNNEL RELEASE;  Surgeon: Franky JONELLE Curia, MD;  Location: [facility];  Service: Orthopedics;  Laterality: Left;   MULTIPLE TOOTH EXTRACTIONS     SHOULDER SURGERY  07/13/2010   lt   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   ULNAR TUNNEL RELEASE  12/15/2011   Procedure: CUBITAL TUNNEL RELEASE;  Surgeon: Franky JONELLE Curia, MD;  Location: [facility];  Service: Orthopedics;  Laterality: Left;  left cubital tunnel release   Past Surgical History:  Procedure Laterality Date   bilateral tubes placed in ears  04/12/2012   CARPAL TUNNEL RELEASE  12/15/2011   Procedure: CARPAL TUNNEL RELEASE;  Surgeon: Franky JONELLE Curia, MD;  Location: [facility];  Service: Orthopedics;  Laterality: Left;   MULTIPLE TOOTH EXTRACTIONS     SHOULDER SURGERY  07/13/2010   lt   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  ULNAR TUNNEL RELEASE  12/15/2011   Procedure: CUBITAL TUNNEL RELEASE;  Surgeon: Franky JONELLE Curia, MD;  Location: [facility];  Service: Orthopedics;  Laterality: Left;  left cubital tunnel release   Past Medical History:  Diagnosis Date   Allergy    Chronic pain due to trauma    Disturbance of skin sensation    Intercostal neuralgia    Lumbosacral neuritis    Myocardial infarction (HCC)    Rotator cuff (capsule) sprain    Snores    BP 120/71 (BP Location: Left Arm, Patient Position: Sitting, Cuff Size: Large)   Pulse 84   Ht 5' 9 (1.753 m)   Wt 252 lb (114.3 kg)   SpO2 96%   BMI 37.21 kg/m   Opioid Risk Score:   Fall Risk Score:  `1  Depression screen [facility] 2/9     03/14/2024    9:14 AM 11/09/2023   10:05 AM 07/23/2023   10:58 AM  02/16/2023   11:05 AM 12/04/2022   10:41 AM 07/31/2022    9:44 AM 01/29/2022   10:08 AM  Depression screen PHQ 2/9  Decreased Interest 0 0 0 0 0 0 0  Down, Depressed, Hopeless 0 0 0 0 0 0 0  PHQ - 2 Score 0 0 0 0 0 0 0      Review of Systems  Musculoskeletal:  Positive for arthralgias, back pain and myalgias.       Left shoulder pain, widespread back pain, bilateral leg pain  All other systems reviewed and are negative.      Objective:   Physical Exam Vitals and nursing note reviewed.  Constitutional:      Appearance: Normal appearance.  Cardiovascular:     Rate and Rhythm: Normal rate and regular rhythm.     Pulses: Normal pulses.     Heart sounds: Normal heart sounds.  Pulmonary:     Effort: Pulmonary effort is normal.     Breath sounds: Normal breath sounds.  Musculoskeletal:     Comments: Normal Muscle Bulk and Muscle Testing Reveals:  Upper Extremities: Right: Full ROM and Muscle Strength 5/5 Left Upper Extremity: Decreased ROM 45 Degrees and Muscle Strength 4/5 Thoracic Hypersensitivity: T-1-T-7 Lumbar Paraspinal Tenderness: L-4-L-5 Right: Greater Trochanter Tenderness Lower Extremities: Decreased ROM and Muscle Strength 4/5 Bilateral Lower Extremity Flexion Produces Pain into her Lumbar Arises from Chair slowly  Antalgic Gait     Skin:    General: Skin is warm and dry.  Neurological:     Mental Status: He is alert and oriented to person, place, and time.  Psychiatric:        Mood and Affect: Mood normal.        Behavior: Behavior normal.          Assessment & Plan:  1. 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 05/30/2024 Refilled: Oxycodone  10 mg one tablet 5 times a day  as needed for pain #150. Second script sent to the pharmacy. We will continue the opioid monitoring program, this consists of regular clinic visits, examinations, urine  drug screen, pill counts as well as use of [place]  Controlled Substance Reporting system. A 12 month History has been reviewed on the [place]  Controlled Substance Reporting System 05/30/2024.  2. Lumbar Radiculitis: Continue current medication regimen with Gabapentin . 05/30/2024 3. Intercostal neuralgia:  continue Flector  Patch Continue current medication regimen with Voltaren  Gel, Robaxin   and Neurontin . 05/30/2024.  4. Insomnia: Continue :  trazodone . 05/30/2024 5. Muscle Spasm: Continue current medication regimen with Robaxin . 05/30/2024 6. Polyarthralgia: Continue current medication regime. Continue to Monitor. 05/30/2024 7. Chronic Bilateral Thoracic Pain: Continue HEP and Continue Current medication regimen. Continue to monitor. 05/30/2024 8. Chronic Right Hip Pain/ Avascular Necrosis: Mr. Ballinas underwent TOTAL HIP ARTHROPLASTY by Jackson Coy, MD Ortho following. 05/30/2024 9. Cervical Lymphadenopathy: Endocrinologist Following. 05/30/2024 S/P Radioactive Iodine Thyroid Ablation on 05/13/2020,Continue to monitor. 05/30/2024 10. Left Elbow Pain: No complaints today.  Ortho following. Continue to monitor.05/30/2024    F/U in 2 months : Due to Financial Hardship

## 2024-05-31 ENCOUNTER — Other Ambulatory Visit: Payer: Self-pay | Admitting: Registered Nurse

## 2024-06-02 LAB — DRUG TOX MONITOR 1 W/CONF, ORAL FLD
Amphetamines: NEGATIVE ng/mL (ref <10)
Barbiturates: NEGATIVE ng/mL (ref <10)
Benzodiazepines: NEGATIVE ng/mL (ref <0.50)
Buprenorphine: NEGATIVE ng/mL (ref <0.10)
Cocaine: NEGATIVE ng/mL (ref <5.0)
Codeine: NEGATIVE ng/mL (ref <2.5)
Cotinine: >250 ng/mL — ABNORMAL HIGH (ref <5.0)
Dihydrocodeine: NEGATIVE ng/mL (ref <2.5)
Fentanyl: NEGATIVE ng/mL (ref <0.10)
Heroin Metabolite: NEGATIVE ng/mL (ref <1.0)
Hydrocodone: NEGATIVE ng/mL (ref <2.5)
Hydromorphone: NEGATIVE ng/mL (ref <2.5)
MARIJUANA: NEGATIVE ng/mL (ref <2.5)
MDMA: NEGATIVE ng/mL (ref <10)
Meprobamate: NEGATIVE ng/mL (ref <2.5)
Methadone: NEGATIVE ng/mL (ref <5.0)
Morphine: NEGATIVE ng/mL (ref <2.5)
Nicotine Metabolite: POSITIVE ng/mL — AB (ref <5.0)
Norhydrocodone: NEGATIVE ng/mL (ref <2.5)
Noroxycodone: 29.3 ng/mL — ABNORMAL HIGH (ref <2.5)
Opiates: POSITIVE ng/mL — AB (ref <2.5)
Oxycodone: >250 ng/mL — ABNORMAL HIGH (ref <2.5)
Oxymorphone: 2.5 ng/mL — ABNORMAL HIGH (ref <2.5)
Phencyclidine: NEGATIVE ng/mL (ref <10)
Tapentadol: NEGATIVE ng/mL (ref <5.0)
Tramadol: NEGATIVE ng/mL (ref <5.0)
Zolpidem: NEGATIVE ng/mL (ref <5.0)

## 2024-06-02 LAB — DRUG TOX ALC METAB W/CON, ORAL FLD: Alcohol Metabolite: NEGATIVE ng/mL (ref <25)

## 2024-06-07 ENCOUNTER — Ambulatory Visit: Admitting: "Endocrinology

## 2024-06-08 ENCOUNTER — Other Ambulatory Visit: Payer: Self-pay | Admitting: "Endocrinology

## 2024-06-30 ENCOUNTER — Other Ambulatory Visit: Payer: Self-pay | Admitting: "Endocrinology

## 2024-06-30 DIAGNOSIS — E119 Type 2 diabetes mellitus without complications: Secondary | ICD-10-CM

## 2024-07-28 ENCOUNTER — Encounter: Admitting: Registered Nurse

## 2024-07-31 NOTE — Progress Notes (Unsigned)
 "  Subjective:    Patient ID: Matthew Sloan, male    DOB: 27-Apr-1961, 64 y.o.   MRN: [id number]  HPI: Matthew EDDLEMAN is a 64 y.o. male who returns for follow up appointment for chronic pain and medication refill. He states his pain is located in his left shoulder, mid- lower back pain radiating into her bilateral hips and bilateral lower extremities. Generalized joint pain. He rates his pain 9. His current exercise regime is walking and performing stretching exercises.  Mr. Wolz Morphine equivalent is 75.00 MME.   Last Oral Swab was Performed on 05/30/2024, it was consistent.      Pain Inventory Average Pain 8 Pain Right Now 9 My pain is constant, sharp, burning, stabbing, tingling, and aching  In the last 24 hours, has pain interfered with the following? General activity 10 Relation with others 8 Enjoyment of life 8 What TIME of day is your pain at its worst? morning , daytime, evening, and night Sleep (in general) Poor  Pain is worse with: walking, bending, sitting, inactivity, and standing Pain improves with: medication Relief from Meds: 2  Family History  Problem Relation Age of Onset   Cancer Mother 52       breast cancer   Heart disease Father 25       CHF   Social History   Socioeconomic History   Marital status: Single    Spouse name: Not on file   Number of children: Not on file   Years of education: Not on file   Highest education level: Not on file  Occupational History   Not on file  Tobacco Use   Smoking status: Every Day    Current packs/day: 1.00    Average packs/day: 1 pack/day for 30.0 years (30.0 ttl pk-yrs)    Types: Cigarettes   Smokeless tobacco: Never   Tobacco comments:    working on it-down to 0.5 ppd  Vaping Use   Vaping status: Never Used  Substance and Sexual Activity   Alcohol  use: No   Drug use: No   Sexual activity: Not on file  Other Topics Concern   Not on file  Social History Narrative   Not on file   Social Drivers of  Health   Tobacco Use: High Risk (05/30/2024)   Patient History    Smoking Tobacco Use: Every Day    Smokeless Tobacco Use: Never    Passive Exposure: Not on file  Financial Resource Strain: Not on file  Food Insecurity: Not on file  Transportation Needs: Not on file  Physical Activity: Not on file  Stress: Not on file  Social Connections: Not on file  Depression (PHQ2-9): Low Risk (03/14/2024)   Depression (PHQ2-9)    PHQ-2 Score: 0  Alcohol  Screen: Not on file  Housing: Not on file  Utilities: Not on file  Health Literacy: Not on file   Past Surgical History:  Procedure Laterality Date   bilateral tubes placed in ears  04/12/2012   CARPAL TUNNEL RELEASE  12/15/2011   Procedure: CARPAL TUNNEL RELEASE;  Surgeon: Franky JONELLE Curia, MD;  Location: [facility];  Service: Orthopedics;  Laterality: Left;   MULTIPLE TOOTH EXTRACTIONS     SHOULDER SURGERY  07/13/2010   lt   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   ULNAR TUNNEL RELEASE  12/15/2011   Procedure: CUBITAL TUNNEL RELEASE;  Surgeon: Franky JONELLE Curia, MD;  Location: [facility];  Service: Orthopedics;  Laterality: Left;  left  cubital tunnel release   Past Surgical History:  Procedure Laterality Date   bilateral tubes placed in ears  04/12/2012   CARPAL TUNNEL RELEASE  12/15/2011   Procedure: CARPAL TUNNEL RELEASE;  Surgeon: Franky JONELLE Curia, MD;  Location: [facility];  Service: Orthopedics;  Laterality: Left;   MULTIPLE TOOTH EXTRACTIONS     SHOULDER SURGERY  07/13/2010   lt   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   ULNAR TUNNEL RELEASE  12/15/2011   Procedure: CUBITAL TUNNEL RELEASE;  Surgeon: Franky JONELLE Curia, MD;  Location: [facility];  Service: Orthopedics;  Laterality: Left;  left cubital tunnel release   Past Medical History:  Diagnosis Date   Allergy    Chronic pain due to trauma    Disturbance of skin sensation    Intercostal neuralgia    Lumbosacral neuritis     Myocardial infarction (HCC)    Rotator cuff (capsule) sprain    Snores    There were no vitals taken for this visit.  Opioid Risk Score:   Fall Risk Score:  `1  Depression screen [facility] 2/9     03/14/2024    9:14 AM 11/09/2023   10:05 AM 07/23/2023   10:58 AM 02/16/2023   11:05 AM 12/04/2022   10:41 AM 07/31/2022    9:44 AM 01/29/2022   10:08 AM  Depression screen PHQ 2/9  Decreased Interest 0 0 0 0 0 0 0  Down, Depressed, Hopeless 0 0 0 0 0 0 0  PHQ - 2 Score 0 0 0 0 0 0 0    Review of Systems  Musculoskeletal:  Positive for back pain.       Pain all over the body, both legs, arms  All other systems reviewed and are negative.      Objective:   Physical Exam Vitals and nursing note reviewed.  Constitutional:      Appearance: Normal appearance.  Cardiovascular:     Rate and Rhythm: Normal rate and regular rhythm.     Pulses: Normal pulses.     Heart sounds: Normal heart sounds.  Pulmonary:     Effort: Pulmonary effort is normal.     Breath sounds: Normal breath sounds.  Musculoskeletal:     Comments: Normal Muscle Bulk and Muscle Testing Reveals:  Upper Extremities: Decreased ROM and Muscle Strength 5/5 Left AC Joint Tenderness  Thoracic Paraspinal Tenderness: T-4-T-6  Lumbar Hypersensitivity Bilateral Greater Trochanter Tenderness Lower Extremities: Decreased ROM and Muscle Strength 5/5 Bilateral Lower Extremities Flexion Produces Pain into his Bilateral Hips and Bilateral Lower extremities and Bilateral Feet  Arises from Chair slowly Antalgic  Gait     Skin:    General: Skin is warm and dry.  Neurological:     Mental Status: He is alert and oriented to person, place, and time.  Psychiatric:        Mood and Affect: Mood normal.        Behavior: Behavior normal.          Assessment & Plan:  1. 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  08/04/2024 Refilled: Oxycodone  10 mg one tablet 5 times a day  as needed for pain #150. Second script sent to the pharmacy. We will continue the opioid monitoring program, this consists of regular clinic visits, examinations, urine drug screen, pill counts as well as use of [place]  Controlled Substance Reporting system. A  12 month History has been reviewed on the [place]  Controlled Substance Reporting System 08/04/2024.  2. Lumbar Radiculitis: Continue current medication regimen with Gabapentin . 08/04/2024 3. Intercostal neuralgia:  continue Flector  Patch Continue current medication regimen with Voltaren  Gel, Robaxin  and Neurontin . 08/04/2024.  4. Insomnia: Continue :  trazodone . 08/04/2024 5. Muscle Spasm: Continue current medication regimen with Robaxin . 08/04/2024 6. Polyarthralgia: Continue current medication regime. Continue to Monitor. 08/04/2024 7. Chronic Bilateral Thoracic Pain: Continue HEP and Continue Current medication regimen. Continue to monitor. 08/04/2024 8. Chronic Right Hip Pain/ Avascular Necrosis: Mr. Aboud underwent TOTAL HIP ARTHROPLASTY by Jackson Coy, MD Ortho following. 08/04/2024 9. Cervical Lymphadenopathy: Endocrinologist Following. 08/04/2024 S/P Radioactive Iodine Thyroid Ablation on 05/13/2020,Continue to monitor. 08/04/2024 10. Left Elbow Pain: No complaints today.  Ortho following. Continue to monitor.08/04/2024   F/U in 2 months : Due to Financial Hardship   "

## 2024-08-04 ENCOUNTER — Encounter: Payer: Self-pay | Admitting: Registered Nurse

## 2024-08-04 ENCOUNTER — Encounter: Attending: Registered Nurse | Admitting: Registered Nurse

## 2024-08-04 VITALS — BP 138/86 | HR 75 | Ht 69.0 in | Wt 254.0 lb

## 2024-08-04 DIAGNOSIS — M25512 Pain in left shoulder: Secondary | ICD-10-CM | POA: Diagnosis not present

## 2024-08-04 DIAGNOSIS — M546 Pain in thoracic spine: Secondary | ICD-10-CM | POA: Diagnosis not present

## 2024-08-04 DIAGNOSIS — M8700 Idiopathic aseptic necrosis of unspecified bone: Secondary | ICD-10-CM | POA: Insufficient documentation

## 2024-08-04 DIAGNOSIS — M519 Unspecified thoracic, thoracolumbar and lumbosacral intervertebral disc disorder: Secondary | ICD-10-CM | POA: Insufficient documentation

## 2024-08-04 DIAGNOSIS — Z79891 Long term (current) use of opiate analgesic: Secondary | ICD-10-CM | POA: Diagnosis not present

## 2024-08-04 DIAGNOSIS — M7062 Trochanteric bursitis, left hip: Secondary | ICD-10-CM | POA: Insufficient documentation

## 2024-08-04 DIAGNOSIS — M7061 Trochanteric bursitis, right hip: Secondary | ICD-10-CM | POA: Insufficient documentation

## 2024-08-04 DIAGNOSIS — M5416 Radiculopathy, lumbar region: Secondary | ICD-10-CM | POA: Diagnosis not present

## 2024-08-04 DIAGNOSIS — G8929 Other chronic pain: Secondary | ICD-10-CM | POA: Diagnosis present

## 2024-08-04 DIAGNOSIS — Z5181 Encounter for therapeutic drug level monitoring: Secondary | ICD-10-CM | POA: Diagnosis not present

## 2024-08-04 DIAGNOSIS — M2550 Pain in unspecified joint: Secondary | ICD-10-CM | POA: Insufficient documentation

## 2024-08-04 DIAGNOSIS — G894 Chronic pain syndrome: Secondary | ICD-10-CM | POA: Insufficient documentation

## 2024-08-04 LAB — TSH: TSH: 0.615 u[IU]/mL (ref 0.450–4.500)

## 2024-08-04 LAB — T4, FREE: Free T4: 1.18 ng/dL (ref 0.82–1.77)

## 2024-08-04 MED ORDER — OXYCODONE HCL 10 MG PO TABS: 10.0000 mg | ORAL_TABLET | Freq: Every day | ORAL | 0 refills | Status: DC | PRN

## 2024-08-04 MED ORDER — OXYCODONE HCL 10 MG PO TABS: 10.0000 mg | ORAL_TABLET | Freq: Every day | ORAL | 0 refills | Status: AC | PRN

## 2024-08-09 ENCOUNTER — Ambulatory Visit: Admitting: "Endocrinology

## 2024-08-22 ENCOUNTER — Ambulatory Visit: Admitting: "Endocrinology

## 2024-09-29 ENCOUNTER — Encounter: Admitting: Registered Nurse

## 2024-11-24 ENCOUNTER — Encounter: Admitting: Registered Nurse
